# Patient Record
Sex: Female | Born: 1970 | Race: Black or African American | Hispanic: No | Marital: Married | Smoking: Never smoker
Health system: Southern US, Community
[De-identification: ages and names within clinical notes are randomized; demographics above are authoritative.]

## PROBLEM LIST (undated history)

## (undated) DIAGNOSIS — C50919 Malignant neoplasm of unspecified site of unspecified female breast: Secondary | ICD-10-CM

## (undated) DIAGNOSIS — E669 Obesity, unspecified: Secondary | ICD-10-CM

## (undated) DIAGNOSIS — D216 Benign neoplasm of connective and other soft tissue of trunk, unspecified: Secondary | ICD-10-CM

## (undated) DIAGNOSIS — Z1239 Encounter for other screening for malignant neoplasm of breast: Secondary | ICD-10-CM

## (undated) DIAGNOSIS — E119 Type 2 diabetes mellitus without complications: Secondary | ICD-10-CM

## (undated) DIAGNOSIS — D3A00 Benign carcinoid tumor of unspecified site: Secondary | ICD-10-CM

## (undated) DIAGNOSIS — F419 Anxiety disorder, unspecified: Secondary | ICD-10-CM

## (undated) HISTORY — DX: Malignant neoplasm of unspecified site of unspecified female breast: C50.919

## (undated) HISTORY — PX: TONSILLECTOMY: SUR1361

## (undated) HISTORY — DX: Type 2 diabetes mellitus without complications: E11.9

## (undated) HISTORY — DX: Obesity, unspecified: E66.9

## (undated) HISTORY — DX: Encounter for other screening for malignant neoplasm of breast: Z12.39

## (undated) HISTORY — DX: Benign neoplasm of connective and other soft tissue of trunk, unspecified: D21.6

## (undated) HISTORY — PX: CHOLECYSTECTOMY: SHX55

## (undated) HISTORY — DX: Benign carcinoid tumor of unspecified site: D3A.00

---

## 2006-05-06 HISTORY — PX: CYST EXCISION: SHX5701

## 2007-07-16 ENCOUNTER — Ambulatory Visit: Payer: Self-pay | Admitting: Obstetrics and Gynecology

## 2008-08-23 ENCOUNTER — Ambulatory Visit: Payer: Self-pay | Admitting: Internal Medicine

## 2010-05-06 DIAGNOSIS — E669 Obesity, unspecified: Secondary | ICD-10-CM

## 2010-05-06 DIAGNOSIS — Z1239 Encounter for other screening for malignant neoplasm of breast: Secondary | ICD-10-CM

## 2010-05-06 DIAGNOSIS — D216 Benign neoplasm of connective and other soft tissue of trunk, unspecified: Secondary | ICD-10-CM

## 2010-05-06 HISTORY — DX: Benign neoplasm of connective and other soft tissue of trunk, unspecified: D21.6

## 2010-05-06 HISTORY — DX: Encounter for other screening for malignant neoplasm of breast: Z12.39

## 2010-05-06 HISTORY — DX: Obesity, unspecified: E66.9

## 2010-08-31 ENCOUNTER — Ambulatory Visit: Payer: Self-pay | Admitting: Internal Medicine

## 2010-09-04 HISTORY — PX: LIPOMA EXCISION: SHX5283

## 2010-09-17 ENCOUNTER — Ambulatory Visit: Payer: Self-pay | Admitting: Internal Medicine

## 2011-10-18 ENCOUNTER — Ambulatory Visit: Payer: Self-pay

## 2012-08-10 ENCOUNTER — Encounter: Payer: Self-pay | Admitting: *Deleted

## 2012-08-18 ENCOUNTER — Ambulatory Visit (INDEPENDENT_AMBULATORY_CARE_PROVIDER_SITE_OTHER): Payer: BC Managed Care – PPO | Admitting: General Surgery

## 2012-08-18 ENCOUNTER — Encounter: Payer: Self-pay | Admitting: General Surgery

## 2012-08-18 VITALS — BP 146/86 | HR 76 | Resp 14 | Ht 69.0 in | Wt 266.0 lb

## 2012-08-18 DIAGNOSIS — D1779 Benign lipomatous neoplasm of other sites: Secondary | ICD-10-CM

## 2012-08-18 DIAGNOSIS — D171 Benign lipomatous neoplasm of skin and subcutaneous tissue of trunk: Secondary | ICD-10-CM

## 2012-08-18 NOTE — Patient Instructions (Addendum)
Continue to monitor areas, Call for questions or concerns or size changes.

## 2012-08-18 NOTE — Progress Notes (Signed)
Patient ID: Allison Harper, female   DOB: 1971/02/06, 42 y.o.   MRN: [id number]  Chief Complaint  Patient presents with  . Other    lipoma    HPI Allison Harper is a 42 y.o. female.  Patient here for evaluation of the lipomas on her back, wants to discuss "removing them". Worse one is on her left back.  Tender but no pain. Has known history of excision of lipomas on her back in 2012. Patient has noticed some left hand "tingling".  The patient has been having difficulty with tingling in the left hand and was under the impression that the left back lipoma overlying the scapula was the source for this discomfort. HPI  Past Medical History  Diagnosis Date  . 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 Obesity 2012  . Breast screening, unspecified 2012    Past Surgical History  Procedure Laterality Date  . Cholecystectomy    . Tonsillectomy    . Cyst excision  2008    from back  . Lipoma excision Left 09/2010    flank    No family history on file.  Social History History  Substance Use Topics  . Smoking status: Never Smoker   . Smokeless tobacco: Not on file  . Alcohol Use: Yes     Comment: ocassionally, 1-2 per month    No Known Allergies  Current Outpatient Prescriptions  Medication Sig Dispense Refill  . drospirenone-ethinyl estradiol (YAZ) 3-0.02 MG tablet Take 1 tablet by mouth daily.       No current facility-administered medications for this visit.    Review of Systems Review of Systems  Constitutional: Negative.   Cardiovascular: Negative.     Blood pressure 146/86, pulse 76, resp. rate 14, height 5\' 9"  (1.753 m), weight 266 lb (120.657 kg), last menstrual period 08/03/2012.  Physical Exam Physical Exam  Constitutional: She is oriented to person, place, and time. She appears well-developed and well-nourished.  Neurological: She is alert and oriented to person, place, and time.  Skin: Skin is warm and dry.   4 x 5  left scapula area lipoma. No evidence of inflammation or local tenderness. Right back at T 11 posterior axillary line noted 2 lipomas 1 x 1 in size   Data Reviewed The left lateral abdominal wall lipoma excised in May 2012 was a simple lipoma. She had previously undergone excision of an angiolipoma from the left back in 2008.  Assessment    Lipoma, clinically asymptomatic.    Plan    Excision is warranted if the areas become symptomatic, otherwise observation alone is warned him.       Earline Mayotte 08/19/2012, 3:06 PM

## 2012-08-19 ENCOUNTER — Encounter: Payer: Self-pay | Admitting: General Surgery

## 2014-02-28 ENCOUNTER — Encounter: Payer: Self-pay | Admitting: General Surgery

## 2014-02-28 ENCOUNTER — Ambulatory Visit (INDEPENDENT_AMBULATORY_CARE_PROVIDER_SITE_OTHER): Payer: BC Managed Care – PPO | Admitting: General Surgery

## 2014-02-28 VITALS — BP 162/84 | HR 76 | Resp 12 | Ht 69.0 in | Wt 273.0 lb

## 2014-02-28 DIAGNOSIS — R2232 Localized swelling, mass and lump, left upper limb: Secondary | ICD-10-CM | POA: Insufficient documentation

## 2014-02-28 NOTE — Progress Notes (Signed)
Patient ID: Allison Harper, female   DOB: June 27, 1970, 43 y.o.   MRN: [id number]  Chief Complaint  Patient presents with  . Other    lipoma on left shoulder    HPI Allison Harper is a 43 y.o. female here today for for a excision left shoulder lipoma. The patient was seen in April 2014. Since that time she reports the area has increased in size and discomfort. 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  HPI  Past Medical History  Diagnosis Date  . 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 Obesity 2012  . Breast screening, unspecified 2012    Past Surgical History  Procedure Laterality Date  . Cholecystectomy    . Tonsillectomy    . Cyst excision  2008    from back  . Lipoma excision Left 09/2010    flank    No family history on file.  Social History History  Substance Use Topics  . Smoking status: Never Smoker   . Smokeless tobacco: Not on file  . Alcohol Use: Yes     Comment: ocassionally, 1-2 per month    No Known Allergies  Current Outpatient Prescriptions  Medication Sig Dispense Refill  . drospirenone-ethinyl estradiol (YAZ) 3-0.02 MG tablet Take 1 tablet by mouth daily.       No current facility-administered medications for this visit.    Review of Systems Review of Systems  Blood pressure 162/84, pulse 76, resp. rate 12, height 5\' 9"  (1.753 m), weight 273 lb (123.832 kg).  Physical Exam Physical Exam Examination of the left posterior shoulder shows a 68 cm diameter area just below the tip of the scapula consistent with a lipoma at or above the level of the deep fascia. No erythema or induration.   Assessment    Symptomatic left lower shoulder lipoma.    Plan    The patient desired excision for relief of her discomfort. The procedure was reviewed. (C she has had previous lipomas excised his office  procedures). Area was prepped with ChloraPrep and draped. A total of 30 mL of 0.5% Xylocaine with 0.25% Marcaine with 1-200,000 units of epinephrine was utilized well tolerated. A transverse incision over the mass was made and carried down through the adipose layer. There was marked adherence of the lipoma-like tissue to the adjacent adipose tissue. It did extend down to but did not transverse the deep fascia. This was excised with sharp dissection. Scant bleeding was noted. The wound was closed in layers with 3-0 Vicryl running suture to the deep adipose layer and a running 3-0 Vicryl subcutaneous suture for the skin. Benzoin and Steri-Strips followed by Telfa and Tegaderm dressing was applied. The patient tolerated procedure well. A prescription for Norco 5/325, #30 with the inscription 1-2 by mouth every 4 hours when necessary for pain was provided. No refills. As she is presently living in [place], she was asked to return for a brief wound checked the next time she is in [place].    PCP: Lonna Duval 02/28/2014, 8:32 PM

## 2014-03-01 NOTE — Addendum Note (Signed)
Addended by: Chrystie Nose on: 03/01/2014 09:35 AM   Modules accepted: Level of Service

## 2014-03-03 ENCOUNTER — Telehealth: Payer: Self-pay | Admitting: *Deleted

## 2014-03-03 NOTE — Telephone Encounter (Signed)
Prescription for tramadol 50 mg, #30 with the inscription one-2 by mouth every 4 hours when necessary for pain with no refills was [facility].

## 2014-03-03 NOTE — Telephone Encounter (Signed)
Pts friend called Nichole and said that the medicince you prescribed the pt which was Norco is making her itch and turn red in the face area, she was wondering can you call in something different for her for pain at [facility] in [place].

## 2014-03-04 ENCOUNTER — Telehealth: Payer: Self-pay | Admitting: *Deleted

## 2014-03-04 LAB — PATHOLOGY

## 2014-03-04 NOTE — Telephone Encounter (Signed)
Called pt on 03-03-14 to let her know that her [facility] into [facility] in 

## 2015-04-27 ENCOUNTER — Other Ambulatory Visit: Payer: Self-pay | Admitting: Obstetrics and Gynecology

## 2015-04-27 DIAGNOSIS — Z1231 Encounter for screening mammogram for malignant neoplasm of breast: Secondary | ICD-10-CM

## 2015-05-03 DIAGNOSIS — E119 Type 2 diabetes mellitus without complications: Secondary | ICD-10-CM | POA: Insufficient documentation

## 2015-05-04 ENCOUNTER — Ambulatory Visit
Admission: RE | Admit: 2015-05-04 | Discharge: 2015-05-04 | Disposition: A | Discharge status: Home / Self Care | Payer: BLUE CROSS/BLUE SHIELD | Source: Ambulatory Visit | Attending: Obstetrics and Gynecology | Admitting: Obstetrics and Gynecology

## 2015-05-04 DIAGNOSIS — Z1231 Encounter for screening mammogram for malignant neoplasm of breast: Secondary | ICD-10-CM | POA: Diagnosis not present

## 2019-09-08 ENCOUNTER — Other Ambulatory Visit: Payer: Self-pay | Admitting: Certified Nurse Midwife

## 2019-09-08 DIAGNOSIS — Z1231 Encounter for screening mammogram for malignant neoplasm of breast: Secondary | ICD-10-CM

## 2019-09-20 ENCOUNTER — Ambulatory Visit
Admission: RE | Admit: 2019-09-20 | Discharge: 2019-09-20 | Disposition: A | Discharge status: Home / Self Care | Payer: BC Managed Care – PPO | Source: Ambulatory Visit | Attending: Certified Nurse Midwife | Admitting: Certified Nurse Midwife

## 2019-09-20 DIAGNOSIS — Z1231 Encounter for screening mammogram for malignant neoplasm of breast: Secondary | ICD-10-CM

## 2019-09-22 ENCOUNTER — Other Ambulatory Visit: Payer: Self-pay | Admitting: Certified Nurse Midwife

## 2019-09-22 DIAGNOSIS — R921 Mammographic calcification found on diagnostic imaging of breast: Secondary | ICD-10-CM

## 2019-09-22 DIAGNOSIS — R928 Other abnormal and inconclusive findings on diagnostic imaging of breast: Secondary | ICD-10-CM

## 2019-10-01 ENCOUNTER — Ambulatory Visit
Admission: RE | Admit: 2019-10-01 | Discharge: 2019-10-01 | Disposition: A | Discharge status: Home / Self Care | Payer: BC Managed Care – PPO | Source: Ambulatory Visit | Attending: Certified Nurse Midwife | Admitting: Certified Nurse Midwife

## 2019-10-01 DIAGNOSIS — R921 Mammographic calcification found on diagnostic imaging of breast: Secondary | ICD-10-CM

## 2019-10-01 DIAGNOSIS — R928 Other abnormal and inconclusive findings on diagnostic imaging of breast: Secondary | ICD-10-CM

## 2019-10-07 ENCOUNTER — Other Ambulatory Visit: Payer: Self-pay | Admitting: Certified Nurse Midwife

## 2019-10-07 DIAGNOSIS — R921 Mammographic calcification found on diagnostic imaging of breast: Secondary | ICD-10-CM

## 2019-10-07 DIAGNOSIS — R928 Other abnormal and inconclusive findings on diagnostic imaging of breast: Secondary | ICD-10-CM

## 2019-12-04 ENCOUNTER — Ambulatory Visit: Payer: BLUE CROSS/BLUE SHIELD

## 2019-12-04 ENCOUNTER — Ambulatory Visit: Payer: Self-pay | Attending: Internal Medicine

## 2019-12-04 DIAGNOSIS — Z23 Encounter for immunization: Secondary | ICD-10-CM

## 2019-12-04 NOTE — Progress Notes (Signed)
   Covid-19 Vaccination Clinic  Name:  Allison Harper    MRN: [id number] DOB: Aug 12, 1970  12/04/2019  [name] was observed post Covid-19 immunization for 15 minutes without incident. She was provided with Vaccine Information Sheet and instruction to access the V-Safe system.   [name] was instructed to call 911 with any severe reactions post vaccine: Marland Kitchen Difficulty breathing  . Swelling of face and throat  . A fast heartbeat  . A bad rash all over body  . Dizziness and weakness   Immunizations Administered    Name Date Dose VIS Date Route   Pfizer COVID-19 Vaccine 12/04/2019  8:07 AM 0.3 mL 06/30/2018 Intramuscular   Manufacturer: [place]   Lot: [id number]   [place]: [address]-6

## 2019-12-25 ENCOUNTER — Ambulatory Visit: Payer: BC Managed Care – PPO

## 2020-05-06 HISTORY — PX: MASTECTOMY: SHX3

## 2021-05-06 DIAGNOSIS — D649 Anemia, unspecified: Secondary | ICD-10-CM

## 2021-05-06 HISTORY — DX: Anemia, unspecified: D64.9

## 2021-05-08 DIAGNOSIS — C50919 Malignant neoplasm of unspecified site of unspecified female breast: Secondary | ICD-10-CM | POA: Insufficient documentation

## 2021-05-11 ENCOUNTER — Inpatient Hospital Stay: Payer: 59 | Attending: Oncology | Admitting: Oncology

## 2021-05-11 ENCOUNTER — Telehealth: Payer: Self-pay | Admitting: *Deleted

## 2021-05-11 ENCOUNTER — Encounter: Payer: Self-pay | Admitting: Oncology

## 2021-05-11 ENCOUNTER — Encounter: Payer: Self-pay | Admitting: *Deleted

## 2021-05-11 ENCOUNTER — Telehealth: Payer: Self-pay | Admitting: Oncology

## 2021-05-11 ENCOUNTER — Other Ambulatory Visit: Payer: Self-pay

## 2021-05-11 VITALS — BP 135/68 | HR 68 | Temp 98.1°F | Resp 16 | Wt 261.0 lb

## 2021-05-11 DIAGNOSIS — C50911 Malignant neoplasm of unspecified site of right female breast: Secondary | ICD-10-CM

## 2021-05-11 DIAGNOSIS — Z7189 Other specified counseling: Secondary | ICD-10-CM

## 2021-05-11 DIAGNOSIS — C779 Secondary and unspecified malignant neoplasm of lymph node, unspecified: Secondary | ICD-10-CM

## 2021-05-11 DIAGNOSIS — Z171 Estrogen receptor negative status [ER-]: Secondary | ICD-10-CM

## 2021-05-11 DIAGNOSIS — C50111 Malignant neoplasm of central portion of right female breast: Secondary | ICD-10-CM | POA: Insufficient documentation

## 2021-05-11 NOTE — Progress Notes (Signed)
Patient here for initial visit she is expressing anxiety about knowing her treatment plan.  She reports that she has gout in her left foot that is a level 8 pain today. She is not being treated for the gout.

## 2021-05-11 NOTE — Telephone Encounter (Signed)
Pt called to cancel all appt after 1-9. She will give you a call when she is ready to start  things.

## 2021-05-11 NOTE — Progress Notes (Signed)
Met patient and her wife at her initial medical  oncology consult with Dr. Janese Banks.  Patient was diagnosed and had surgery with a mastectomy of the right breast at [facility].  She transferred care here for her chemotherapy treatment.  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  Gave patient breast cancer educational literature, "My Breast Cancer Treatment Handbook" by Josephine Igo, RN.   Patient to call with any questions or needs.

## 2021-05-11 NOTE — Telephone Encounter (Signed)
Pt has a gout flare out and wanted something for it. Dr. Janese Banks sent me a message that she does not want her to have steroids and rec: naproxen OTC 500 mg bid for 5 days. I did leave her a message that I did change the pharmacy to be [facility]. Left my name and number to call if she needs to call back for any reason

## 2021-05-14 ENCOUNTER — Inpatient Hospital Stay: Payer: 59

## 2021-05-14 DIAGNOSIS — Z1731 Human epidermal growth factor receptor 2 positive status: Secondary | ICD-10-CM | POA: Insufficient documentation

## 2021-05-14 DIAGNOSIS — C50911 Malignant neoplasm of unspecified site of right female breast: Secondary | ICD-10-CM | POA: Insufficient documentation

## 2021-05-15 ENCOUNTER — Ambulatory Visit: Payer: 59

## 2021-05-16 ENCOUNTER — Ambulatory Visit: Payer: 59 | Admitting: Radiation Oncology

## 2021-05-18 ENCOUNTER — Ambulatory Visit (HOSPITAL_BASED_OUTPATIENT_CLINIC_OR_DEPARTMENT_OTHER): Payer: 59

## 2021-05-18 DIAGNOSIS — Z6839 Body mass index (BMI) 39.0-39.9, adult: Secondary | ICD-10-CM | POA: Insufficient documentation

## 2021-05-18 DIAGNOSIS — M109 Gout, unspecified: Secondary | ICD-10-CM | POA: Insufficient documentation

## 2021-05-18 DIAGNOSIS — Z79899 Other long term (current) drug therapy: Secondary | ICD-10-CM | POA: Insufficient documentation

## 2021-05-20 DIAGNOSIS — C50111 Malignant neoplasm of central portion of right female breast: Secondary | ICD-10-CM | POA: Insufficient documentation

## 2021-05-20 DIAGNOSIS — Z7189 Other specified counseling: Secondary | ICD-10-CM | POA: Insufficient documentation

## 2021-05-20 NOTE — Progress Notes (Signed)
Hematology/Oncology Consult note [facility] [phone] [phone]  Patient Care Team: Pcp, No as PCP - General Bary Castilla Forest Gleason, MD as Consulting Physician (General Surgery)   Name of the patient: Allison Harper  [id number]  19-Jul-1970    Reason for referral-new diagnosis of breast cancer   Referring physician- Dr. Jackson Latino  Date of visit: 05/20/21   History of presenting illness- Patient is a 51 year old female with a past medical history significant for obesity who self palpated a right breast mass that led to a diagnostic mammogram.  Bilateral diagnostic mammogram on 02/28/2021 showed heterogeneous dense breast tissue and malignant appearing calcifications involving the lateral right breast spanning 15 cm.  Suggestion of multiple asymmetries about the area of malignant calcifications.  Also suggestion of asymmetries in the upper outer and upper inner quadrant of the left breast.  Multiple clusters of calcifications in the lower outer quadrant of the left breast also considered suspicious.  Bilateral breast and axillary ultrasound also showed abnormal heterogeneous tissue in the upper central and outer right breast.  Ultrasound of the right axilla showed multiple mildly thickened lymph nodes  Patient had a stereotactic core biopsy of the left breast lower outer quadrant which showed fibrocystic changes negative for malignancy.  Right breast upper outer quadrant stereotactic biopsy showed DCIS with comedonecrosis negative for invasive malignancy ER negative less than 1%.  Right axillary lymph node aspiration showed benign lymphoid and fibroadipose tissue.  Left breast upper inner quadrant biopsy also showed fibrocystic changes negative for malignancy.  Patient had a bilateral breast MRI at [facility].  I do not have those images to review.  This was done on 04/02/2021 which again confirmed extensive abnormal enhancement 16 x 4.7 x 14 cm  occupying the entire lateral and central portions of the right breast.  Asymmetric prominent right axillary lymph nodes and a biopsy of a different axillary lymph node at that time was suggested second look ultrasound for the left breast was recommended.  Second look ultrasound of the left breast showed no suspicious focal abnormality in the upper outer quadrant of the left breast and otherwise appearance of normal fibroglandular tissue and a left breast MRI was recommended in 6 months.  Patient underwent right total mastectomy with right axillary sentinel lymph node biopsy without immediate reconstruction by Dr. Oletha Blend at [facility] on 04/20/2021.  Final pathology showed extensive DCIS with multiple foci of microinvasion measuring 14.8 x 13 x 6 cm.  There was a focus of poorly differentiated invasive carcinoma located centrally measuring at least 12 mm microscopically.  Surgical margins were negative.  5 sentinel lymph nodes was negative for metastatic disease.  There was an abnormal right axillary nonsentinel lymph node which was taken out at the time of surgery measuring 3.1 x 2.2 x 1.5 cm which contained greater than 3 mm metastatic focus without extranodal extension.  This invasive component was negative for ER, negative for PR and positive for HER2 by IHC (+3).  Patient lives in [place] and therefore has been referred to [place] for consideration of adjuvant chemotherapy options.  Patient is here with her significant other.  She does feel overwhelmed with her current diagnosis.  She was expecting that all of this would turn out to be DCIS but the fact that this turned out to be invasive cancer with a likelihood of needing chemotherapy has been overwhelming for patient to deal with.  Patient currently reports pain and swelling in her left foot which began  about 2 to 3 days ago.  She has had prior episodes of gout.  ECOG PS- 0  Pain scale- 0   Review of systems- Review of Systems  Constitutional:   Negative for chills, fever, malaise/fatigue and weight loss.  HENT:  Negative for congestion, ear discharge and nosebleeds.   Eyes:  Negative for blurred vision.  Respiratory:  Negative for cough, hemoptysis, sputum production, shortness of breath and wheezing.   Cardiovascular:  Negative for chest pain, palpitations, orthopnea and claudication.  Gastrointestinal:  Negative for abdominal pain, blood in stool, constipation, diarrhea, heartburn, melena, nausea and vomiting.  Genitourinary:  Negative for dysuria, flank pain, frequency, hematuria and urgency.  Musculoskeletal:  Negative for back pain, joint pain and myalgias.  Skin:  Negative for rash.  Neurological:  Negative for dizziness, tingling, focal weakness, seizures, weakness and headaches.  Endo/Heme/Allergies:  Does not bruise/bleed easily.  Psychiatric/Behavioral:  Negative for depression and suicidal ideas. The patient does not have insomnia.    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, unspecified 05/06/2010     Past Surgical History:  Procedure Laterality Date   CHOLECYSTECTOMY     CYST EXCISION  2008   from back   LIPOMA EXCISION Left 09/2010   flank   TONSILLECTOMY      Social History   Socioeconomic History   Marital status: Single    Spouse name: Not on file   Number of children: Not on file   Years of education: Not on file   Highest education level: Not on file  Occupational History   Not on file  Tobacco Use   Smoking status: Never   Smokeless tobacco: Not on file  Substance and Sexual Activity   Alcohol use: Yes    Comment: ocassionally, 1-2 per month   Drug  use: No   Sexual activity: Not on file  Other Topics Concern   Not on file  Social History Narrative   Not on file   Social Determinants of Health   Financial Resource Strain: Not on file  Food Insecurity: Not on file  Transportation Needs: Not on file  Physical Activity: Not on file  Stress: Not on file  Social Connections: Not on file  Intimate Partner Violence: Not on file     Family History  Problem Relation Age of Onset   Breast cancer Neg Hx     No current outpatient medications on file.   Physical exam:  Vitals:   05/11/21 1156  BP: 135/68  Pulse: 68  Resp: 16  Temp: 98.1 F (36.7 C)  TempSrc: Tympanic  SpO2: 100%  Weight: 261 lb (118.4 kg)   Physical Exam Constitutional:      General: She is not in acute distress. Cardiovascular:     Rate and Rhythm: Normal rate and regular rhythm.     Heart sounds: Normal heart sounds.  Pulmonary:     Effort: Pulmonary effort is normal.     Breath sounds: Normal breath sounds.  Abdominal:     General: Bowel sounds are normal.     Palpations: Abdomen is soft.  Musculoskeletal:  Comments: Base of left toe appears red and swollen consistent with acute gout  Skin:    General: Skin is warm and dry.  Neurological:     Mental Status: She is alert and oriented to person, place, and time.  Breast exam: No palpable masses in the left breast.  No palpable right axillary adenopathy.  Scar of recent right mastectomy is healing well   Assessment and plan- Patient is a 51 y.o. female with newly diagnosed pathological prognostic stage II A pT2 pN1a MX invasive mammary carcinoma of the right breast here to discuss further management  I discussed mammogram ultrasound as well as MRI findings in detail with the patient.  In the setting of extensive right breast DCIS spanning 16 cm patient had a focus of invasive mammary carcinoma 1.2 cm in size which is ER/PR negative and HER2 positive.  5 sentinel lymph nodes were negative for  malignancy but one nonsentinel lymph node did have a 3 mm focus of metastatic disease without extranodal extension.  This constitutes pathological prognostic stage IIa disease.  Given that her tumor is ER/PR negative and HER2 positive this would be considered aggressive biology warranting adjuvant chemotherapy.  With node positive disease I would recommend adjuvant Taxotere, carboplatin Herceptin and Perjeta (TCHP) regimen given IV every 3 weeks for 6 cycles.  Following that patient will need adjuvant radiation treatment and will continue with Herceptin and Perjeta alone to complete 1 year of treatment.  Discussed risks and benefits of both Taxotere and carboplatin including all but not limited to nausea, vomiting, low blood counts, risk of infections and hospitalization.  Risk of peripheral neuropathy associated with both Taxotere and carboplatin.  Risk of cardiotoxicity skin rash diarrhea associated with Herceptin and Perjeta.  Treatment will be given with a curative intent.    Data for offering TCHP chemotherapy adjuvantly is mainly extrapolated from neoadjuvant node positive disease setting as well as the Affinity trial which showed benefit in adding perjeta to Herceptin in the adjuvant setting for node positive disease.In the phase III TRAIN-2 trial, among 438 patients with stage II to III HER2-positive breast cancer randomly assigned to anthracycline-containing chemotherapy (three cycles of 5-fluoruoracil, epirubicin, and cyclophosphamide followed by six cycles of weekly paclitaxel and carboplatin, administered days 1 and 8 every three weeks) versus an anthracycline-free chemotherapy regimen (nine cycles of weekly paclitaxel and carboplatin, using the same schedule), with trastuzumab and pertuzumab administered every three weeks with all chemotherapy cycles, the pCR rates did not differ between the arms (67 versus 68 percent) . Updated results from this study demonstrate equivalent three-year EFS (94  versus 93 percent) and OS (98 versus 98 percent) for the anthracycline-free versus the anthracycline-containing regimens, respectively    Patient will need a baseline echocardiogram as well as chemo teach.  Port will be placed by Dr. Jackson Latino.  Given that she has node positive disease I would also like to get systemic scans including CT chest abdomen and pelvis with contrast and bone scan to complete her staging work-up.  Patient had less than 3 lymph nodes positive and therefore I do not recommend additional axillary lymph node dissection at this time.  She would be requiring adjuvant radiation treatment anyways and additional axillary lymph node dissection will increase her risk of lymphedema significantly.  Had any known that patient had lymph node positive HER2 positive disease prior to undergoing mastectomy she would still receive the same [facility] chemotherapy neoadjuvantly and if there was residual disease left at the time of surgery we  would have switched her to [place].  However given that we did not have this information before surgery I would not recommend any [place] at this time.  There would be no role for endocrine therapy given that she has ER negative disease.  With regards to her acute gout-I did review the patient's kidney functions which were normal and my nurse gave her a call that a trial of NSAIDs for 3 to 4 days would be reasonable before considering steroid therapy for acute gout.  She would also benefit from genetic testing and a referral was made at today's visit  Plan was for me to see her tentatively in 2 weeks time to start adjuvant chemotherapy.  However patient is not happy with her care in [place] and has decided not to come for her future appointments   Cancer Staging  Malignant neoplasm of central portion of right breast in female, estrogen receptor negative ([place]) Staging form: Breast, AJCC 8th Edition - Pathologic stage from 05/11/2021: Stage IIA (pT1c,  pN1a(sn), cM0, G3, ER-, PR-, HER2+) - Signed by Sindy Guadeloupe, MD on 05/20/2021 Stage prefix: Initial diagnosis Method of lymph node assessment: Sentinel lymph node biopsy Multigene prognostic tests performed: None Histologic grading system: 3 grade system Residual tumor (R): R0 - None     Thank you for this kind referral and the opportunity to participate in the care of this  Patient   Visit Diagnosis 1. Right breast cancer with malignant cells in regional lymph nodes no greater than 0.2 mm and no more than 200 cells (HCC)   2. Malignant neoplasm of right breast in female, estrogen receptor negative, unspecified site of breast [facility])     Dr. Randa Evens, MD, MPH [facility] at [facility] [phone] 05/20/2021

## 2021-05-21 ENCOUNTER — Ambulatory Visit: Payer: 59

## 2021-05-21 ENCOUNTER — Other Ambulatory Visit: Payer: 59

## 2021-05-22 ENCOUNTER — Ambulatory Visit: Payer: 59 | Admitting: Oncology

## 2021-05-22 ENCOUNTER — Other Ambulatory Visit: Payer: 59

## 2021-05-22 ENCOUNTER — Telehealth: Payer: Self-pay | Admitting: *Deleted

## 2021-05-22 ENCOUNTER — Ambulatory Visit: Payer: 59

## 2021-05-22 ENCOUNTER — Telehealth: Payer: Self-pay | Admitting: Oncology

## 2021-05-22 NOTE — Telephone Encounter (Signed)
The only thing I can think of is that the authorization of the scans may be active even though the scans has been cancelled. And when the pt is somewhere else getting the scan that it shows approval with auth and they will not allow get same scan at another facility.  So maybe cancel the authorization

## 2021-05-22 NOTE — Telephone Encounter (Signed)
Call from [facility] requesting that we cancel patient scans as she is transferring care to them and will being having xray at [facility]. Looks like this has already been cancelled and noted

## 2021-05-22 NOTE — Telephone Encounter (Signed)
Mattie from [facility] called about cancelling a CT scan order for this pt. Pt will be seen at [facility]  for treatment. Call back at [phone]  to let [facility] know that order has been cancelled.

## 2021-05-23 ENCOUNTER — Encounter: Payer: Self-pay | Admitting: *Deleted

## 2021-05-23 ENCOUNTER — Ambulatory Visit: Payer: 59 | Admitting: Occupational Therapy

## 2021-05-24 ENCOUNTER — Ambulatory Visit: Payer: 59

## 2021-05-24 ENCOUNTER — Encounter: Payer: Self-pay | Admitting: *Deleted

## 2021-05-24 DIAGNOSIS — R918 Other nonspecific abnormal finding of lung field: Secondary | ICD-10-CM | POA: Insufficient documentation

## 2021-05-24 DIAGNOSIS — R590 Localized enlarged lymph nodes: Secondary | ICD-10-CM | POA: Insufficient documentation

## 2021-05-25 ENCOUNTER — Other Ambulatory Visit: Payer: 59

## 2021-05-25 ENCOUNTER — Ambulatory Visit: Payer: 59 | Admitting: Oncology

## 2021-05-25 ENCOUNTER — Encounter: Payer: 59 | Admitting: Licensed Clinical Social Worker

## 2021-05-25 ENCOUNTER — Ambulatory Visit: Payer: 59

## 2021-05-25 ENCOUNTER — Encounter: Payer: Self-pay | Admitting: Oncology

## 2021-07-04 DIAGNOSIS — D0511 Intraductal carcinoma in situ of right breast: Secondary | ICD-10-CM | POA: Insufficient documentation

## 2021-07-26 DIAGNOSIS — C7A8 Other malignant neuroendocrine tumors: Secondary | ICD-10-CM | POA: Insufficient documentation

## 2021-11-15 ENCOUNTER — Ambulatory Visit
Admission: RE | Admit: 2021-11-15 | Discharge: 2021-11-15 | Disposition: A | Discharge status: Home / Self Care | Payer: 59 | Source: Ambulatory Visit | Attending: Radiation Oncology | Admitting: Radiation Oncology

## 2021-11-15 ENCOUNTER — Encounter: Payer: Self-pay | Admitting: Radiation Oncology

## 2021-11-15 VITALS — BP 122/77 | HR 93 | Temp 97.8°F | Wt 231.8 lb

## 2021-11-15 DIAGNOSIS — Z7984 Long term (current) use of oral hypoglycemic drugs: Secondary | ICD-10-CM | POA: Insufficient documentation

## 2021-11-15 DIAGNOSIS — C773 Secondary and unspecified malignant neoplasm of axilla and upper limb lymph nodes: Secondary | ICD-10-CM | POA: Diagnosis present

## 2021-11-15 DIAGNOSIS — Z7952 Long term (current) use of systemic steroids: Secondary | ICD-10-CM | POA: Diagnosis not present

## 2021-11-15 DIAGNOSIS — Z171 Estrogen receptor negative status [ER-]: Secondary | ICD-10-CM | POA: Diagnosis not present

## 2021-11-15 DIAGNOSIS — C50111 Malignant neoplasm of central portion of right female breast: Secondary | ICD-10-CM | POA: Insufficient documentation

## 2021-11-15 NOTE — Consult Note (Signed)
NEW PATIENT EVALUATION  Name: Allison Harper  MRN: [id number]  Date:   11/15/2021     DOB: Jun 29, 1970   This 51 y.o. 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  REFERRING PHYSICIAN: No ref. provider found  CHIEF COMPLAINT:  Chief Complaint  Patient presents with   Breast Cancer    Consult    DIAGNOSIS: The encounter diagnosis was Malignant neoplasm of central portion of right breast in female, estrogen receptor negative ([place]).   PREVIOUS INVESTIGATIONS:  Ultrasound mammograms reviewed Pathology reports reviewed Clinical notes reviewed  HPI: Patient is a pleasant 51 year old female with a rather complicated history of breast cancer.  She initially presented with self discovered mass in the right breast.  Mammogram showed heterogeneous dense breasts and malignant appearing calcifications involving the lateral right breast spanning 15 cm.  Axillary ultrasound of the right axilla showed multiple mildly thickened lymph nodes.  Stereotactic core biopsy of the right breast showed DCIS with comedonecrosis negative for invasive carcinoma.  Right axillary lymph node aspiration also was benign.  She underwent MRI scan and during diagnostic which again confirmed extensive abnormal enhancement measuring 16 cm in greatest dimension of the entire lateral and central portions of the right breast.  There is also asymmetric prominent right axillary lymph nodes.  She underwent a right modified radical mastectomy in December 2022 with pathology showing DCIS with multiple foci of microinvasion measuring 14.8 x 13 x 6 cm.  There was a focus of poorly differentiated invasive carcinoma in the central portion measuring at least 1.2 cm.  Margins were negative.  6 axillary lymph nodes were evaluated one of them had a 3 mm focus of  metastatic disease and a lymph node measuring 3.1 x 2.2 x 1.5 cm.  Tumor was ER/PR negative and HER2/neu overexpressed.  Patient also had multiple pulmonary nodules which were biopsied were low grade neuroendocrine tumors that is being followed.  She has been started on [facility] chemotherapy and has 1 more cycle left.  She is tolerating her chemotherapy well.  She is seen today for radiation oncology opinion.  She is doing well specifically denies any swelling in her right upper extremity any bone pain cough or any new nodularities in the mastectomy scar.  PLANNED TREATMENT REGIMEN: Adjuvant radiation therapy  PAST MEDICAL HISTORY:  has a past medical history of Breast cancer ([place]), Breast screening, unspecified (05/06/2010), Obesity (05/06/2010), and Other benign neoplasm of connective and other soft tissue of trunk, unspecified (05/06/2010).    PAST SURGICAL HISTORY:  Past Surgical History:  Procedure Laterality Date   CHOLECYSTECTOMY     CYST EXCISION  2008   from back   LIPOMA EXCISION Left 09/2010   flank   TONSILLECTOMY      FAMILY HISTORY: family history is not on file.  SOCIAL HISTORY:  reports that she has never smoked. She does not have any smokeless tobacco history on file. She reports current alcohol use. She reports that she does not use drugs.  ALLERGIES: Norco [hydrocodone-acetaminophen]  MEDICATIONS:  Current Outpatient Medications  Medication Sig Dispense Refill   metFORMIN (GLUCOPHAGE) 500 MG tablet Take by mouth.     ondansetron (ZOFRAN) 8 MG tablet Take by mouth.     prochlorperazine (COMPAZINE) 10 MG tablet Take by mouth.     dexamethasone (DECADRON) 4 MG tablet Take 16 mg by  mouth daily.     levofloxacin (LEVAQUIN) 500 MG tablet Take 500 mg by mouth daily.     lidocaine-prilocaine (EMLA) cream SMARTSIG:1 Topical Daily     LORazepam (ATIVAN) 0.5 MG tablet Take 0.5 mg by mouth daily as needed.     No current facility-administered medications for this encounter.     ECOG PERFORMANCE STATUS:  0 - Asymptomatic  REVIEW OF SYSTEMS: Patient denies any weight loss, fatigue, weakness, fever, chills or night sweats. Patient denies any loss of vision, blurred vision. Patient denies any ringing  of the ears or hearing loss. No irregular heartbeat. Patient denies heart murmur or history of fainting. Patient denies any chest pain or pain radiating to her upper extremities. Patient denies any shortness of breath, difficulty breathing at night, cough or hemoptysis. Patient denies any swelling in the lower legs. Patient denies any nausea vomiting, vomiting of blood, or coffee ground material in the vomitus. Patient denies any stomach pain. Patient states has had normal bowel movements no significant constipation or diarrhea. Patient denies any dysuria, hematuria or significant nocturia. Patient denies any problems walking, swelling in the joints or loss of balance. Patient denies any skin changes, loss of hair or loss of weight. Patient denies any excessive worrying or anxiety or significant depression. Patient denies any problems with insomnia. Patient denies excessive thirst, polyuria, polydipsia. Patient denies any swollen glands, patient denies easy bruising or easy bleeding. Patient denies any recent infections, allergies or URI. Patient "s visual fields have not changed significantly in recent time.   PHYSICAL EXAM: BP 122/77   Pulse 93   Temp 97.8 F (36.6 C)   Wt 231 lb 12.8 oz (105.1 kg)   BMI 34.23 kg/m  Patient status post right modified radical mastectomy incisions well-healed no evidence of mass or nodularity mastectomy scars noted.  Left breast is free of dominant mass.  No axillary or supraclavicular adenopathy is identified.  Well-developed well-nourished patient in NAD. HEENT reveals PERLA, EOMI, discs not visualized.  Oral cavity is clear. No oral mucosal lesions are identified. Neck is clear without evidence of cervical or supraclavicular adenopathy.  Lungs are clear to A&P. Cardiac examination is essentially unremarkable with regular rate and rhythm without murmur rub or thrill. Abdomen is benign with no organomegaly or masses noted. Motor sensory and DTR levels are equal and symmetric in the upper and lower extremities. Cranial nerves II through XII are grossly intact. Proprioception is intact. No peripheral adenopathy or edema is identified. No motor or sensory levels are noted. Crude visual fields are within normal range.  LABORATORY DATA: Pathology reports reviewed    RADIOLOGY RESULTS: Mammogram ultrasound reviewed MRI scans requested PET scan requested   IMPRESSION: Stage IIa HER2/neu positive ER/PR negative invasive mammary carcinoma the right breast at [facility] modified radical mastectomy and sentinel node biopsy in 51 year old female  PLAN: At this time based on incomplete axillary dissection positive axillary lymph node and initial findings of multiple abnormal nodes on ultrasound I favor adjuvant radiation therapy.  She is considering reconstruction.  I am debating whether to spare her mastectomy scar and just treat her axilla and will consult with some of my colleagues on this situation.  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  Would at least planned 5040 cGy in 28 fractions to her right axilla.  Risks and benefits of treatment clued skin reaction fatigue alteration of blood counts possible development of lymphedema all were discussed in detail with the patient.  She seems to  comprehend my treatment plan well.  If she is leaning against reconstruction may include her right chest wall.  I have personally scheduled patient for simulation in early August after completion of her chemotherapy.  Patient comprehends my recommendations well.  I would like to take this opportunity to thank you for allowing me to participate in the care of your patient.Noreene Filbert, MD

## 2021-12-10 ENCOUNTER — Ambulatory Visit: Admission: RE | Admit: 2021-12-10 | Payer: 59 | Source: Ambulatory Visit

## 2021-12-10 DIAGNOSIS — Z510 Encounter for antineoplastic radiation therapy: Secondary | ICD-10-CM | POA: Insufficient documentation

## 2021-12-10 DIAGNOSIS — C50111 Malignant neoplasm of central portion of right female breast: Secondary | ICD-10-CM | POA: Diagnosis present

## 2021-12-10 DIAGNOSIS — C773 Secondary and unspecified malignant neoplasm of axilla and upper limb lymph nodes: Secondary | ICD-10-CM | POA: Insufficient documentation

## 2021-12-13 DIAGNOSIS — Z510 Encounter for antineoplastic radiation therapy: Secondary | ICD-10-CM | POA: Diagnosis not present

## 2021-12-14 ENCOUNTER — Other Ambulatory Visit: Payer: Self-pay | Admitting: *Deleted

## 2021-12-14 DIAGNOSIS — C50111 Malignant neoplasm of central portion of right female breast: Secondary | ICD-10-CM

## 2021-12-17 ENCOUNTER — Ambulatory Visit: Admission: RE | Admit: 2021-12-17 | Payer: 59 | Source: Ambulatory Visit

## 2021-12-17 DIAGNOSIS — Z510 Encounter for antineoplastic radiation therapy: Secondary | ICD-10-CM | POA: Diagnosis not present

## 2021-12-18 ENCOUNTER — Ambulatory Visit
Admission: RE | Admit: 2021-12-18 | Discharge: 2021-12-18 | Disposition: A | Discharge status: Home / Self Care | Payer: 59 | Source: Ambulatory Visit | Attending: Radiation Oncology | Admitting: Radiation Oncology

## 2021-12-18 ENCOUNTER — Other Ambulatory Visit: Payer: Self-pay

## 2021-12-18 DIAGNOSIS — Z510 Encounter for antineoplastic radiation therapy: Secondary | ICD-10-CM | POA: Diagnosis not present

## 2021-12-18 LAB — RAD ONC ARIA SESSION SUMMARY
Course Elapsed Days: 0
Plan Fractions Treated to Date: 1
Plan Prescribed Dose Per Fraction: 1.8 Gy
Plan Total Fractions Prescribed: 28
Plan Total Prescribed Dose: 50.4 Gy
Reference Point Dosage Given to Date: 1.8 Gy
Reference Point Session Dosage Given: 1.8 Gy
Session Number: 1

## 2021-12-19 ENCOUNTER — Ambulatory Visit
Admission: RE | Admit: 2021-12-19 | Discharge: 2021-12-19 | Disposition: A | Discharge status: Home / Self Care | Payer: 59 | Source: Ambulatory Visit | Attending: Radiation Oncology | Admitting: Radiation Oncology

## 2021-12-19 ENCOUNTER — Other Ambulatory Visit: Payer: Self-pay

## 2021-12-19 DIAGNOSIS — Z510 Encounter for antineoplastic radiation therapy: Secondary | ICD-10-CM | POA: Diagnosis not present

## 2021-12-19 LAB — RAD ONC ARIA SESSION SUMMARY
Course Elapsed Days: 1
Plan Fractions Treated to Date: 2
Plan Prescribed Dose Per Fraction: 1.8 Gy
Plan Total Fractions Prescribed: 28
Plan Total Prescribed Dose: 50.4 Gy
Reference Point Dosage Given to Date: 3.6 Gy
Reference Point Session Dosage Given: 1.8 Gy
Session Number: 2

## 2021-12-20 ENCOUNTER — Ambulatory Visit
Admission: RE | Admit: 2021-12-20 | Discharge: 2021-12-20 | Disposition: A | Discharge status: Home / Self Care | Payer: 59 | Source: Ambulatory Visit | Attending: Radiation Oncology | Admitting: Radiation Oncology

## 2021-12-20 ENCOUNTER — Other Ambulatory Visit: Payer: Self-pay

## 2021-12-20 DIAGNOSIS — Z510 Encounter for antineoplastic radiation therapy: Secondary | ICD-10-CM | POA: Diagnosis not present

## 2021-12-20 LAB — RAD ONC ARIA SESSION SUMMARY
Course Elapsed Days: 2
Plan Fractions Treated to Date: 3
Plan Prescribed Dose Per Fraction: 1.8 Gy
Plan Total Fractions Prescribed: 28
Plan Total Prescribed Dose: 50.4 Gy
Reference Point Dosage Given to Date: 5.4 Gy
Reference Point Session Dosage Given: 1.8 Gy
Session Number: 3

## 2021-12-21 ENCOUNTER — Ambulatory Visit
Admission: RE | Admit: 2021-12-21 | Discharge: 2021-12-21 | Disposition: A | Discharge status: Home / Self Care | Payer: 59 | Source: Ambulatory Visit | Attending: Radiation Oncology | Admitting: Radiation Oncology

## 2021-12-21 ENCOUNTER — Other Ambulatory Visit: Payer: Self-pay

## 2021-12-21 DIAGNOSIS — Z510 Encounter for antineoplastic radiation therapy: Secondary | ICD-10-CM | POA: Diagnosis not present

## 2021-12-21 LAB — RAD ONC ARIA SESSION SUMMARY
Course Elapsed Days: 3
Plan Fractions Treated to Date: 4
Plan Prescribed Dose Per Fraction: 1.8 Gy
Plan Total Fractions Prescribed: 28
Plan Total Prescribed Dose: 50.4 Gy
Reference Point Dosage Given to Date: 7.2 Gy
Reference Point Session Dosage Given: 1.8 Gy
Session Number: 4

## 2021-12-24 ENCOUNTER — Other Ambulatory Visit: Payer: Self-pay

## 2021-12-24 ENCOUNTER — Ambulatory Visit
Admission: RE | Admit: 2021-12-24 | Discharge: 2021-12-24 | Disposition: A | Discharge status: Home / Self Care | Payer: 59 | Source: Ambulatory Visit | Attending: Radiation Oncology | Admitting: Radiation Oncology

## 2021-12-24 DIAGNOSIS — Z510 Encounter for antineoplastic radiation therapy: Secondary | ICD-10-CM | POA: Diagnosis not present

## 2021-12-24 LAB — RAD ONC ARIA SESSION SUMMARY
Course Elapsed Days: 6
Plan Fractions Treated to Date: 5
Plan Prescribed Dose Per Fraction: 1.8 Gy
Plan Total Fractions Prescribed: 28
Plan Total Prescribed Dose: 50.4 Gy
Reference Point Dosage Given to Date: 9 Gy
Reference Point Session Dosage Given: 1.8 Gy
Session Number: 5

## 2021-12-25 ENCOUNTER — Other Ambulatory Visit: Payer: Self-pay

## 2021-12-25 ENCOUNTER — Ambulatory Visit
Admission: RE | Admit: 2021-12-25 | Discharge: 2021-12-25 | Disposition: A | Discharge status: Home / Self Care | Payer: 59 | Source: Ambulatory Visit | Attending: Radiation Oncology | Admitting: Radiation Oncology

## 2021-12-25 DIAGNOSIS — Z510 Encounter for antineoplastic radiation therapy: Secondary | ICD-10-CM | POA: Diagnosis not present

## 2021-12-25 LAB — RAD ONC ARIA SESSION SUMMARY
Course Elapsed Days: 7
Plan Fractions Treated to Date: 6
Plan Prescribed Dose Per Fraction: 1.8 Gy
Plan Total Fractions Prescribed: 28
Plan Total Prescribed Dose: 50.4 Gy
Reference Point Dosage Given to Date: 10.8 Gy
Reference Point Session Dosage Given: 1.8 Gy
Session Number: 6

## 2021-12-26 ENCOUNTER — Ambulatory Visit
Admission: RE | Admit: 2021-12-26 | Discharge: 2021-12-26 | Disposition: A | Discharge status: Home / Self Care | Payer: 59 | Source: Ambulatory Visit | Attending: Radiation Oncology | Admitting: Radiation Oncology

## 2021-12-26 ENCOUNTER — Other Ambulatory Visit: Payer: Self-pay

## 2021-12-26 DIAGNOSIS — Z510 Encounter for antineoplastic radiation therapy: Secondary | ICD-10-CM | POA: Diagnosis not present

## 2021-12-26 LAB — RAD ONC ARIA SESSION SUMMARY
Course Elapsed Days: 8
Plan Fractions Treated to Date: 7
Plan Prescribed Dose Per Fraction: 1.8 Gy
Plan Total Fractions Prescribed: 28
Plan Total Prescribed Dose: 50.4 Gy
Reference Point Dosage Given to Date: 12.6 Gy
Reference Point Session Dosage Given: 1.8 Gy
Session Number: 7

## 2021-12-27 ENCOUNTER — Other Ambulatory Visit: Payer: Self-pay

## 2021-12-27 ENCOUNTER — Ambulatory Visit
Admission: RE | Admit: 2021-12-27 | Discharge: 2021-12-27 | Disposition: A | Discharge status: Home / Self Care | Payer: 59 | Source: Ambulatory Visit | Attending: Radiation Oncology | Admitting: Radiation Oncology

## 2021-12-27 DIAGNOSIS — Z510 Encounter for antineoplastic radiation therapy: Secondary | ICD-10-CM | POA: Diagnosis not present

## 2021-12-27 LAB — RAD ONC ARIA SESSION SUMMARY
Course Elapsed Days: 9
Plan Fractions Treated to Date: 8
Plan Prescribed Dose Per Fraction: 1.8 Gy
Plan Total Fractions Prescribed: 28
Plan Total Prescribed Dose: 50.4 Gy
Reference Point Dosage Given to Date: 14.4 Gy
Reference Point Session Dosage Given: 1.8 Gy
Session Number: 8

## 2021-12-28 ENCOUNTER — Other Ambulatory Visit: Payer: Self-pay

## 2021-12-28 ENCOUNTER — Ambulatory Visit
Admission: RE | Admit: 2021-12-28 | Discharge: 2021-12-28 | Disposition: A | Discharge status: Home / Self Care | Payer: 59 | Source: Ambulatory Visit | Attending: Radiation Oncology | Admitting: Radiation Oncology

## 2021-12-28 ENCOUNTER — Telehealth: Payer: Self-pay | Admitting: Internal Medicine

## 2021-12-28 DIAGNOSIS — Z510 Encounter for antineoplastic radiation therapy: Secondary | ICD-10-CM | POA: Diagnosis not present

## 2021-12-28 LAB — RAD ONC ARIA SESSION SUMMARY
Course Elapsed Days: 10
Plan Fractions Treated to Date: 9
Plan Prescribed Dose Per Fraction: 1.8 Gy
Plan Total Fractions Prescribed: 28
Plan Total Prescribed Dose: 50.4 Gy
Reference Point Dosage Given to Date: 16.2 Gy
Reference Point Session Dosage Given: 1.8 Gy
Session Number: 9

## 2021-12-28 NOTE — Telephone Encounter (Signed)
Patient called and stated that she would like to cancel her appointment with Dr. Rogue Bussing next week (purpose of this visit was to transfer care over from Dr. Janese Banks). She stated that she would call back once she decides "what direction to go".

## 2021-12-31 ENCOUNTER — Inpatient Hospital Stay: Payer: 59

## 2021-12-31 ENCOUNTER — Ambulatory Visit
Admission: RE | Admit: 2021-12-31 | Discharge: 2021-12-31 | Disposition: A | Discharge status: Home / Self Care | Payer: 59 | Source: Ambulatory Visit | Attending: Radiation Oncology | Admitting: Radiation Oncology

## 2021-12-31 ENCOUNTER — Other Ambulatory Visit: Payer: Self-pay

## 2021-12-31 DIAGNOSIS — Z171 Estrogen receptor negative status [ER-]: Secondary | ICD-10-CM

## 2021-12-31 DIAGNOSIS — C50111 Malignant neoplasm of central portion of right female breast: Secondary | ICD-10-CM | POA: Insufficient documentation

## 2021-12-31 DIAGNOSIS — Z510 Encounter for antineoplastic radiation therapy: Secondary | ICD-10-CM | POA: Diagnosis not present

## 2021-12-31 LAB — RAD ONC ARIA SESSION SUMMARY
Course Elapsed Days: 13
Plan Fractions Treated to Date: 10
Plan Prescribed Dose Per Fraction: 1.8 Gy
Plan Total Fractions Prescribed: 28
Plan Total Prescribed Dose: 50.4 Gy
Reference Point Dosage Given to Date: 18 Gy
Reference Point Session Dosage Given: 1.8 Gy
Session Number: 10

## 2021-12-31 LAB — CBC
HCT: 33.4 % — ABNORMAL LOW (ref 36.0–46.0)
Hemoglobin: 10.4 g/dL — ABNORMAL LOW (ref 12.0–15.0)
MCH: 29.1 pg (ref 26.0–34.0)
MCHC: 31.1 g/dL (ref 30.0–36.0)
MCV: 93.3 fL (ref 80.0–100.0)
Platelets: 314 10*3/uL (ref 150–400)
RBC: 3.58 MIL/uL — ABNORMAL LOW (ref 3.87–5.11)
RDW: 14.8 % (ref 11.5–15.5)
WBC: 5.4 10*3/uL (ref 4.0–10.5)
nRBC: 0 % (ref 0.0–0.2)

## 2022-01-01 ENCOUNTER — Other Ambulatory Visit: Payer: Self-pay

## 2022-01-01 ENCOUNTER — Ambulatory Visit: Payer: 59 | Admitting: Internal Medicine

## 2022-01-01 ENCOUNTER — Ambulatory Visit
Admission: RE | Admit: 2022-01-01 | Discharge: 2022-01-01 | Disposition: A | Discharge status: Home / Self Care | Payer: 59 | Source: Ambulatory Visit | Attending: Radiation Oncology | Admitting: Radiation Oncology

## 2022-01-01 DIAGNOSIS — Z510 Encounter for antineoplastic radiation therapy: Secondary | ICD-10-CM | POA: Diagnosis not present

## 2022-01-01 LAB — RAD ONC ARIA SESSION SUMMARY
Course Elapsed Days: 14
Plan Fractions Treated to Date: 11
Plan Prescribed Dose Per Fraction: 1.8 Gy
Plan Total Fractions Prescribed: 28
Plan Total Prescribed Dose: 50.4 Gy
Reference Point Dosage Given to Date: 19.8 Gy
Reference Point Session Dosage Given: 1.8 Gy
Session Number: 11

## 2022-01-02 ENCOUNTER — Other Ambulatory Visit: Payer: Self-pay

## 2022-01-02 ENCOUNTER — Ambulatory Visit
Admission: RE | Admit: 2022-01-02 | Discharge: 2022-01-02 | Disposition: A | Discharge status: Home / Self Care | Payer: 59 | Source: Ambulatory Visit | Attending: Radiation Oncology | Admitting: Radiation Oncology

## 2022-01-02 DIAGNOSIS — Z510 Encounter for antineoplastic radiation therapy: Secondary | ICD-10-CM | POA: Diagnosis not present

## 2022-01-02 LAB — RAD ONC ARIA SESSION SUMMARY
Course Elapsed Days: 15
Plan Fractions Treated to Date: 12
Plan Prescribed Dose Per Fraction: 1.8 Gy
Plan Total Fractions Prescribed: 28
Plan Total Prescribed Dose: 50.4 Gy
Reference Point Dosage Given to Date: 21.6 Gy
Reference Point Session Dosage Given: 1.8 Gy
Session Number: 12

## 2022-01-03 ENCOUNTER — Ambulatory Visit
Admission: RE | Admit: 2022-01-03 | Discharge: 2022-01-03 | Disposition: A | Discharge status: Home / Self Care | Payer: 59 | Source: Ambulatory Visit | Attending: Radiation Oncology | Admitting: Radiation Oncology

## 2022-01-03 ENCOUNTER — Other Ambulatory Visit: Payer: Self-pay

## 2022-01-03 DIAGNOSIS — Z510 Encounter for antineoplastic radiation therapy: Secondary | ICD-10-CM | POA: Diagnosis not present

## 2022-01-03 LAB — RAD ONC ARIA SESSION SUMMARY
Course Elapsed Days: 16
Plan Fractions Treated to Date: 13
Plan Prescribed Dose Per Fraction: 1.8 Gy
Plan Total Fractions Prescribed: 28
Plan Total Prescribed Dose: 50.4 Gy
Reference Point Dosage Given to Date: 23.4 Gy
Reference Point Session Dosage Given: 1.8 Gy
Session Number: 13

## 2022-01-04 ENCOUNTER — Other Ambulatory Visit: Payer: Self-pay

## 2022-01-04 ENCOUNTER — Ambulatory Visit
Admission: RE | Admit: 2022-01-04 | Discharge: 2022-01-04 | Disposition: A | Discharge status: Home / Self Care | Payer: 59 | Source: Ambulatory Visit | Attending: Radiation Oncology | Admitting: Radiation Oncology

## 2022-01-04 DIAGNOSIS — Z510 Encounter for antineoplastic radiation therapy: Secondary | ICD-10-CM | POA: Insufficient documentation

## 2022-01-04 DIAGNOSIS — Z171 Estrogen receptor negative status [ER-]: Secondary | ICD-10-CM | POA: Insufficient documentation

## 2022-01-04 DIAGNOSIS — C773 Secondary and unspecified malignant neoplasm of axilla and upper limb lymph nodes: Secondary | ICD-10-CM | POA: Insufficient documentation

## 2022-01-04 DIAGNOSIS — C7A8 Other malignant neuroendocrine tumors: Secondary | ICD-10-CM | POA: Diagnosis not present

## 2022-01-04 DIAGNOSIS — C50111 Malignant neoplasm of central portion of right female breast: Secondary | ICD-10-CM | POA: Insufficient documentation

## 2022-01-04 LAB — RAD ONC ARIA SESSION SUMMARY
Course Elapsed Days: 17
Plan Fractions Treated to Date: 14
Plan Prescribed Dose Per Fraction: 1.8 Gy
Plan Total Fractions Prescribed: 28
Plan Total Prescribed Dose: 50.4 Gy
Reference Point Dosage Given to Date: 25.2 Gy
Reference Point Session Dosage Given: 1.8 Gy
Session Number: 14

## 2022-01-08 ENCOUNTER — Ambulatory Visit
Admission: RE | Admit: 2022-01-08 | Discharge: 2022-01-08 | Disposition: A | Discharge status: Home / Self Care | Payer: 59 | Source: Ambulatory Visit | Attending: Radiation Oncology | Admitting: Radiation Oncology

## 2022-01-08 ENCOUNTER — Other Ambulatory Visit: Payer: Self-pay

## 2022-01-08 DIAGNOSIS — Z510 Encounter for antineoplastic radiation therapy: Secondary | ICD-10-CM | POA: Diagnosis not present

## 2022-01-08 LAB — RAD ONC ARIA SESSION SUMMARY
Course Elapsed Days: 21
Plan Fractions Treated to Date: 15
Plan Prescribed Dose Per Fraction: 1.8 Gy
Plan Total Fractions Prescribed: 28
Plan Total Prescribed Dose: 50.4 Gy
Reference Point Dosage Given to Date: 27 Gy
Reference Point Session Dosage Given: 1.8 Gy
Session Number: 15

## 2022-01-09 ENCOUNTER — Ambulatory Visit
Admission: RE | Admit: 2022-01-09 | Discharge: 2022-01-09 | Disposition: A | Discharge status: Home / Self Care | Payer: 59 | Source: Ambulatory Visit | Attending: Radiation Oncology | Admitting: Radiation Oncology

## 2022-01-09 ENCOUNTER — Other Ambulatory Visit: Payer: Self-pay

## 2022-01-09 DIAGNOSIS — Z510 Encounter for antineoplastic radiation therapy: Secondary | ICD-10-CM | POA: Diagnosis not present

## 2022-01-09 LAB — RAD ONC ARIA SESSION SUMMARY
Course Elapsed Days: 22
Plan Fractions Treated to Date: 16
Plan Prescribed Dose Per Fraction: 1.8 Gy
Plan Total Fractions Prescribed: 28
Plan Total Prescribed Dose: 50.4 Gy
Reference Point Dosage Given to Date: 28.8 Gy
Reference Point Session Dosage Given: 1.8 Gy
Session Number: 16

## 2022-01-10 ENCOUNTER — Ambulatory Visit
Admission: RE | Admit: 2022-01-10 | Discharge: 2022-01-10 | Disposition: A | Discharge status: Home / Self Care | Payer: 59 | Source: Ambulatory Visit | Attending: Radiation Oncology | Admitting: Radiation Oncology

## 2022-01-10 ENCOUNTER — Other Ambulatory Visit: Payer: Self-pay

## 2022-01-10 DIAGNOSIS — Z510 Encounter for antineoplastic radiation therapy: Secondary | ICD-10-CM | POA: Diagnosis not present

## 2022-01-10 LAB — RAD ONC ARIA SESSION SUMMARY
Course Elapsed Days: 23
Plan Fractions Treated to Date: 17
Plan Prescribed Dose Per Fraction: 1.8 Gy
Plan Total Fractions Prescribed: 28
Plan Total Prescribed Dose: 50.4 Gy
Reference Point Dosage Given to Date: 30.6 Gy
Reference Point Session Dosage Given: 1.8 Gy
Session Number: 17

## 2022-01-11 ENCOUNTER — Ambulatory Visit
Admission: RE | Admit: 2022-01-11 | Discharge: 2022-01-11 | Disposition: A | Discharge status: Home / Self Care | Payer: 59 | Source: Ambulatory Visit | Attending: Radiation Oncology | Admitting: Radiation Oncology

## 2022-01-11 ENCOUNTER — Other Ambulatory Visit: Payer: Self-pay

## 2022-01-11 DIAGNOSIS — Z510 Encounter for antineoplastic radiation therapy: Secondary | ICD-10-CM | POA: Diagnosis not present

## 2022-01-11 LAB — RAD ONC ARIA SESSION SUMMARY
Course Elapsed Days: 24
Plan Fractions Treated to Date: 18
Plan Prescribed Dose Per Fraction: 1.8 Gy
Plan Total Fractions Prescribed: 28
Plan Total Prescribed Dose: 50.4 Gy
Reference Point Dosage Given to Date: 32.4 Gy
Reference Point Session Dosage Given: 1.8 Gy
Session Number: 18

## 2022-01-14 ENCOUNTER — Other Ambulatory Visit: Payer: Self-pay

## 2022-01-14 ENCOUNTER — Inpatient Hospital Stay: Payer: 59 | Attending: Radiation Oncology

## 2022-01-14 ENCOUNTER — Ambulatory Visit
Admission: RE | Admit: 2022-01-14 | Discharge: 2022-01-14 | Disposition: A | Discharge status: Home / Self Care | Payer: 59 | Source: Ambulatory Visit | Attending: Radiation Oncology | Admitting: Radiation Oncology

## 2022-01-14 DIAGNOSIS — Z510 Encounter for antineoplastic radiation therapy: Secondary | ICD-10-CM | POA: Diagnosis not present

## 2022-01-14 DIAGNOSIS — C50111 Malignant neoplasm of central portion of right female breast: Secondary | ICD-10-CM

## 2022-01-14 LAB — RAD ONC ARIA SESSION SUMMARY
Course Elapsed Days: 27
Plan Fractions Treated to Date: 19
Plan Prescribed Dose Per Fraction: 1.8 Gy
Plan Total Fractions Prescribed: 28
Plan Total Prescribed Dose: 50.4 Gy
Reference Point Dosage Given to Date: 34.2 Gy
Reference Point Session Dosage Given: 1.8 Gy
Session Number: 19

## 2022-01-14 LAB — CBC
HCT: 36.3 % (ref 36.0–46.0)
Hemoglobin: 11.6 g/dL — ABNORMAL LOW (ref 12.0–15.0)
MCH: 29.4 pg (ref 26.0–34.0)
MCHC: 32 g/dL (ref 30.0–36.0)
MCV: 92.1 fL (ref 80.0–100.0)
Platelets: 231 10*3/uL (ref 150–400)
RBC: 3.94 MIL/uL (ref 3.87–5.11)
RDW: 14.1 % (ref 11.5–15.5)
WBC: 5.6 10*3/uL (ref 4.0–10.5)
nRBC: 0 % (ref 0.0–0.2)

## 2022-01-15 ENCOUNTER — Other Ambulatory Visit: Payer: Self-pay

## 2022-01-15 ENCOUNTER — Ambulatory Visit
Admission: RE | Admit: 2022-01-15 | Discharge: 2022-01-15 | Disposition: A | Discharge status: Home / Self Care | Payer: 59 | Source: Ambulatory Visit | Attending: Radiation Oncology | Admitting: Radiation Oncology

## 2022-01-15 DIAGNOSIS — Z510 Encounter for antineoplastic radiation therapy: Secondary | ICD-10-CM | POA: Diagnosis not present

## 2022-01-15 LAB — RAD ONC ARIA SESSION SUMMARY
Course Elapsed Days: 28
Plan Fractions Treated to Date: 20
Plan Prescribed Dose Per Fraction: 1.8 Gy
Plan Total Fractions Prescribed: 28
Plan Total Prescribed Dose: 50.4 Gy
Reference Point Dosage Given to Date: 36 Gy
Reference Point Session Dosage Given: 1.8 Gy
Session Number: 20

## 2022-01-16 ENCOUNTER — Other Ambulatory Visit: Payer: Self-pay

## 2022-01-16 ENCOUNTER — Ambulatory Visit
Admission: RE | Admit: 2022-01-16 | Discharge: 2022-01-16 | Disposition: A | Discharge status: Home / Self Care | Payer: 59 | Source: Ambulatory Visit | Attending: Radiation Oncology | Admitting: Radiation Oncology

## 2022-01-16 DIAGNOSIS — Z510 Encounter for antineoplastic radiation therapy: Secondary | ICD-10-CM | POA: Diagnosis not present

## 2022-01-16 LAB — RAD ONC ARIA SESSION SUMMARY
Course Elapsed Days: 29
Plan Fractions Treated to Date: 21
Plan Prescribed Dose Per Fraction: 1.8 Gy
Plan Total Fractions Prescribed: 28
Plan Total Prescribed Dose: 50.4 Gy
Reference Point Dosage Given to Date: 37.8 Gy
Reference Point Session Dosage Given: 1.8 Gy
Session Number: 21

## 2022-01-17 ENCOUNTER — Ambulatory Visit
Admission: RE | Admit: 2022-01-17 | Discharge: 2022-01-17 | Disposition: A | Discharge status: Home / Self Care | Payer: 59 | Source: Ambulatory Visit | Attending: Radiation Oncology | Admitting: Radiation Oncology

## 2022-01-17 ENCOUNTER — Other Ambulatory Visit: Payer: Self-pay

## 2022-01-17 DIAGNOSIS — Z510 Encounter for antineoplastic radiation therapy: Secondary | ICD-10-CM | POA: Diagnosis not present

## 2022-01-17 LAB — RAD ONC ARIA SESSION SUMMARY
Course Elapsed Days: 30
Plan Fractions Treated to Date: 22
Plan Prescribed Dose Per Fraction: 1.8 Gy
Plan Total Fractions Prescribed: 28
Plan Total Prescribed Dose: 50.4 Gy
Reference Point Dosage Given to Date: 39.6 Gy
Reference Point Session Dosage Given: 1.8 Gy
Session Number: 22

## 2022-01-18 ENCOUNTER — Ambulatory Visit: Payer: 59

## 2022-01-21 ENCOUNTER — Other Ambulatory Visit: Payer: Self-pay

## 2022-01-21 ENCOUNTER — Ambulatory Visit
Admission: RE | Admit: 2022-01-21 | Discharge: 2022-01-21 | Disposition: A | Discharge status: Home / Self Care | Payer: 59 | Source: Ambulatory Visit | Attending: Radiation Oncology | Admitting: Radiation Oncology

## 2022-01-21 DIAGNOSIS — Z510 Encounter for antineoplastic radiation therapy: Secondary | ICD-10-CM | POA: Diagnosis not present

## 2022-01-21 LAB — RAD ONC ARIA SESSION SUMMARY
Course Elapsed Days: 34
Plan Fractions Treated to Date: 23
Plan Prescribed Dose Per Fraction: 1.8 Gy
Plan Total Fractions Prescribed: 28
Plan Total Prescribed Dose: 50.4 Gy
Reference Point Dosage Given to Date: 41.4 Gy
Reference Point Session Dosage Given: 1.8 Gy
Session Number: 23

## 2022-01-22 ENCOUNTER — Other Ambulatory Visit: Payer: Self-pay

## 2022-01-22 ENCOUNTER — Ambulatory Visit
Admission: RE | Admit: 2022-01-22 | Discharge: 2022-01-22 | Disposition: A | Discharge status: Home / Self Care | Payer: 59 | Source: Ambulatory Visit | Attending: Radiation Oncology | Admitting: Radiation Oncology

## 2022-01-22 DIAGNOSIS — Z510 Encounter for antineoplastic radiation therapy: Secondary | ICD-10-CM | POA: Diagnosis not present

## 2022-01-22 LAB — RAD ONC ARIA SESSION SUMMARY
Course Elapsed Days: 35
Plan Fractions Treated to Date: 24
Plan Prescribed Dose Per Fraction: 1.8 Gy
Plan Total Fractions Prescribed: 28
Plan Total Prescribed Dose: 50.4 Gy
Reference Point Dosage Given to Date: 43.2 Gy
Reference Point Session Dosage Given: 1.8 Gy
Session Number: 24

## 2022-01-23 ENCOUNTER — Other Ambulatory Visit: Payer: Self-pay

## 2022-01-23 ENCOUNTER — Inpatient Hospital Stay: Payer: 59

## 2022-01-23 ENCOUNTER — Ambulatory Visit
Admission: RE | Admit: 2022-01-23 | Discharge: 2022-01-23 | Disposition: A | Discharge status: Home / Self Care | Payer: 59 | Source: Ambulatory Visit | Attending: Radiation Oncology | Admitting: Radiation Oncology

## 2022-01-23 DIAGNOSIS — Z510 Encounter for antineoplastic radiation therapy: Secondary | ICD-10-CM | POA: Diagnosis not present

## 2022-01-23 LAB — RAD ONC ARIA SESSION SUMMARY
Course Elapsed Days: 36
Plan Fractions Treated to Date: 25
Plan Prescribed Dose Per Fraction: 1.8 Gy
Plan Total Fractions Prescribed: 28
Plan Total Prescribed Dose: 50.4 Gy
Reference Point Dosage Given to Date: 45 Gy
Reference Point Session Dosage Given: 1.8 Gy
Session Number: 25

## 2022-01-24 ENCOUNTER — Other Ambulatory Visit: Payer: Self-pay

## 2022-01-24 ENCOUNTER — Ambulatory Visit
Admission: RE | Admit: 2022-01-24 | Discharge: 2022-01-24 | Disposition: A | Discharge status: Home / Self Care | Payer: 59 | Source: Ambulatory Visit | Attending: Radiation Oncology | Admitting: Radiation Oncology

## 2022-01-24 DIAGNOSIS — Z510 Encounter for antineoplastic radiation therapy: Secondary | ICD-10-CM | POA: Diagnosis not present

## 2022-01-24 LAB — RAD ONC ARIA SESSION SUMMARY
Course Elapsed Days: 37
Plan Fractions Treated to Date: 26
Plan Prescribed Dose Per Fraction: 1.8 Gy
Plan Total Fractions Prescribed: 28
Plan Total Prescribed Dose: 50.4 Gy
Reference Point Dosage Given to Date: 46.8 Gy
Reference Point Session Dosage Given: 1.8 Gy
Session Number: 26

## 2022-01-25 ENCOUNTER — Ambulatory Visit
Admission: RE | Admit: 2022-01-25 | Discharge: 2022-01-25 | Disposition: A | Discharge status: Home / Self Care | Payer: 59 | Source: Ambulatory Visit | Attending: Radiation Oncology | Admitting: Radiation Oncology

## 2022-01-25 ENCOUNTER — Other Ambulatory Visit: Payer: Self-pay

## 2022-01-25 ENCOUNTER — Ambulatory Visit: Payer: 59

## 2022-01-25 DIAGNOSIS — Z510 Encounter for antineoplastic radiation therapy: Secondary | ICD-10-CM | POA: Diagnosis not present

## 2022-01-25 LAB — RAD ONC ARIA SESSION SUMMARY
Course Elapsed Days: 38
Plan Fractions Treated to Date: 27
Plan Prescribed Dose Per Fraction: 1.8 Gy
Plan Total Fractions Prescribed: 28
Plan Total Prescribed Dose: 50.4 Gy
Reference Point Dosage Given to Date: 48.6 Gy
Reference Point Session Dosage Given: 1.8 Gy
Session Number: 27

## 2022-01-28 ENCOUNTER — Ambulatory Visit: Payer: 59

## 2022-01-28 ENCOUNTER — Ambulatory Visit
Admission: RE | Admit: 2022-01-28 | Discharge: 2022-01-28 | Disposition: A | Discharge status: Home / Self Care | Payer: 59 | Source: Ambulatory Visit | Attending: Radiation Oncology | Admitting: Radiation Oncology

## 2022-01-28 ENCOUNTER — Other Ambulatory Visit: Payer: Self-pay

## 2022-01-28 DIAGNOSIS — Z510 Encounter for antineoplastic radiation therapy: Secondary | ICD-10-CM | POA: Diagnosis not present

## 2022-01-28 LAB — RAD ONC ARIA SESSION SUMMARY
Course Elapsed Days: 41
Plan Fractions Treated to Date: 28
Plan Prescribed Dose Per Fraction: 1.8 Gy
Plan Total Fractions Prescribed: 28
Plan Total Prescribed Dose: 50.4 Gy
Reference Point Dosage Given to Date: 50.4 Gy
Reference Point Session Dosage Given: 1.8 Gy
Session Number: 28

## 2022-01-30 ENCOUNTER — Inpatient Hospital Stay: Payer: 59

## 2022-01-30 ENCOUNTER — Inpatient Hospital Stay: Payer: 59 | Admitting: Internal Medicine

## 2022-01-30 ENCOUNTER — Encounter: Payer: Self-pay | Admitting: Internal Medicine

## 2022-01-30 VITALS — BP 122/71 | HR 83 | Temp 97.8°F | Resp 20 | Ht 69.0 in | Wt 236.6 lb

## 2022-01-30 DIAGNOSIS — Z510 Encounter for antineoplastic radiation therapy: Secondary | ICD-10-CM | POA: Diagnosis not present

## 2022-01-30 DIAGNOSIS — Z171 Estrogen receptor negative status [ER-]: Secondary | ICD-10-CM | POA: Diagnosis not present

## 2022-01-30 DIAGNOSIS — C50111 Malignant neoplasm of central portion of right female breast: Secondary | ICD-10-CM

## 2022-01-30 LAB — COMPREHENSIVE METABOLIC PANEL
ALT: 11 U/L (ref 0–44)
AST: 20 U/L (ref 15–41)
Albumin: 4.1 g/dL (ref 3.5–5.0)
Alkaline Phosphatase: 62 U/L (ref 38–126)
Anion gap: 6 (ref 5–15)
BUN: 9 mg/dL (ref 6–20)
CO2: 25 mmol/L (ref 22–32)
Calcium: 9.1 mg/dL (ref 8.9–10.3)
Chloride: 108 mmol/L (ref 98–111)
Creatinine, Ser: 0.64 mg/dL (ref 0.44–1.00)
GFR, Estimated: >60 mL/min (ref >60)
Glucose, Bld: 114 mg/dL — ABNORMAL HIGH (ref 70–99)
Potassium: 4 mmol/L (ref 3.5–5.1)
Sodium: 139 mmol/L (ref 135–145)
Total Bilirubin: 0.4 mg/dL (ref 0.3–1.2)
Total Protein: 7.7 g/dL (ref 6.5–8.1)

## 2022-01-30 LAB — CBC WITH DIFFERENTIAL/PLATELET
Abs Immature Granulocytes: 0.01 10*3/uL (ref 0.00–0.07)
Basophils Absolute: 0.1 10*3/uL (ref 0.0–0.1)
Basophils Relative: 1 %
Eosinophils Absolute: 0.3 10*3/uL (ref 0.0–0.5)
Eosinophils Relative: 6 %
HCT: 35.6 % — ABNORMAL LOW (ref 36.0–46.0)
Hemoglobin: 11.7 g/dL — ABNORMAL LOW (ref 12.0–15.0)
Immature Granulocytes: 0 %
Lymphocytes Relative: 25 %
Lymphs Abs: 1.3 10*3/uL (ref 0.7–4.0)
MCH: 29.1 pg (ref 26.0–34.0)
MCHC: 32.9 g/dL (ref 30.0–36.0)
MCV: 88.6 fL (ref 80.0–100.0)
Monocytes Absolute: 0.4 10*3/uL (ref 0.1–1.0)
Monocytes Relative: 7 %
Neutro Abs: 3.2 10*3/uL (ref 1.7–7.7)
Neutrophils Relative %: 61 %
Platelets: 257 10*3/uL (ref 150–400)
RBC: 4.02 MIL/uL (ref 3.87–5.11)
RDW: 13.4 % (ref 11.5–15.5)
WBC: 5.3 10*3/uL (ref 4.0–10.5)
nRBC: 0 % (ref 0.0–0.2)

## 2022-01-30 LAB — MAGNESIUM: Magnesium: 1.6 mg/dL — ABNORMAL LOW (ref 1.7–2.4)

## 2022-01-30 NOTE — Progress Notes (Signed)
START ON PATHWAY REGIMEN - Breast     Cycle 1: A cycle is 21 days:     Pertuzumab-Trastuzumab-hyaluronidase-zzxf      Docetaxel      Carboplatin    Cycles 2 through 6: A cycle is every 21 days:     Pertuzumab-Trastuzumab-hyaluronidase-zzxf      Docetaxel      Carboplatin    Cycles 7 through 17: A cycle is every 21 days:     Pertuzumab-Trastuzumab-hyaluronidase-zzxf   **Always confirm dose/schedule in your pharmacy ordering system**  Patient Characteristics: Postoperative without Neoadjuvant Therapy (Pathologic Staging), Invasive Disease, Adjuvant Therapy, HER2 Positive, ER Negative/Unknown, Node Positive, pT1, pN1a or Higher Therapeutic Status: Postoperative without Neoadjuvant Therapy (Pathologic Staging) AJCC Grade: G3 AJCC N Category: pN1 AJCC M Category: cM0 ER Status: Negative (-) AJCC 8 Stage Grouping: IIA HER2 Status: Positive (+) Oncotype Dx Recurrence Score: Not Appropriate AJCC T Category: pT1c PR Status: Negative (-) Adjuvant Therapy Status: No Adjuvant Therapy Received Yet or Changing Initial Adjuvant Regimen due to Tolerance Intent of Therapy: Curative Intent, Discussed with Patient

## 2022-01-30 NOTE — Progress Notes (Unsigned)
Patient states she is having some Post chemo leg cramping.

## 2022-01-30 NOTE — Progress Notes (Unsigned)
[facility] CONSULT NOTE  Patient Care Team: Pcp, No as PCP - General Byrnett, Forest Gleason, MD as Consulting Physician (General Surgery)  CHIEF COMPLAINTS/PURPOSE OF CONSULTATION: breast cancer   Oncology History Overview Note  Final Diagnosis    1.  Lymph node, right axilla, excision:   -One lymph node, negative for carcinoma (0/1).   2. Sentinel lymph node, right axilla #1, excision:   -One lymph node, negative for carcinoma; multiple levels examined (0/1).   3. Sentinel lymph node, right axilla #2, excision:   -One lymph node, negative for carcinoma; multiple levels examined (0/1).   4. Sentinel lymph node, right axilla #3, excision:   -Two lymph nodes, negative for carcinoma; multiple levels examined (0/2).   5.  Lymph node, right axilla, excision:   -One lymph node, positive for carcinoma (1/1). -Metastatic focus measures greater than 3 mm. -No extranodal extension is seen.   6.  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 -Invasive focus measures 12 mm microscopically (measured on slide 6E). 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 -Margins are negative for in situ and invasive carcinoma; closest is posterior at 7 mm and greater than 10 mm respectively. -Usual ductal hyperplasia, cystic apocrine metaplasia, fibroadenomatoid changes and biopsy site changes present. -pT1c ([id number] M-not applicable; AJCC stage IIA. -Please see synoptic report below.    # 04/20/2021: [Dr.Rao- ]Right breast invasive mammary carcinoma; declined neoadjuvant chemotherapy.  S/p mastectomy and SNL, pT1c(m) pN1a, ER low positive, PR negative and HER2 amplified-stage II.  Currently status post   adjuvant TCH-P x6 cycles [last 72/27]; Dr.Chrystal- [s/p  01/28/2022]. Phesgo cycle 1 day 1   [01/18/2022]  # OCT 6th, 2023- Phesgo every 3 weeks for total of 11 more cycles. [Total of 1 year].   # 07/13/2021: Status post FNA left lung nodule showing neoplastic cells consistent with low-grade neuroendocrine tumor [[facility]]. 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 Awaiting  PET/CT scan per Dr. Kern Alberta.   # PCOS/DM-sec to steroids- metformin   Malignant neoplasm of central portion of right breast in female, estrogen receptor negative ([place])  05/11/2021 Cancer Staging   Staging form: Breast, AJCC 8th Edition - Pathologic stage from 05/11/2021: Stage IIA (pT1c, pN1a(sn), cM0, G3, ER-, PR-, HER2+) - Signed by Sindy Guadeloupe, MD on 05/20/2021 Stage prefix: Initial diagnosis Method of lymph node assessment: Sentinel lymph node biopsy Multigene prognostic tests performed: None Histologic grading system: 3 grade system Residual tumor (R): R0 - None   05/20/2021 Initial Diagnosis   Malignant neoplasm of central portion of right breast in female, estrogen receptor negative ([place])   08/07/2021 -  Chemotherapy   Patient is on Treatment Plan : BREAST DOCEtaxel + Trastuzumab + Pertuzumab (THP) q21d x 8 cycles / Trastuzumab + Pertuzumab q21d x 4 cycles        HISTORY OF PRESENTING ILLNESS: Ambulating independently.  Accompanied by her wife.   Allison Harper 51 y.o.  female history of ER/PR negative HER2/neu positive right breast cancer is here to establish care.  Patient was diagnosed with breast cancer December 2022.  Patient was initially recommended neoadjuvant chemotherapy.  However patient chose to proceed with mastectomy.  This is followed by adjuvant chemotherapy-TCH  plus P x6 cycles[finished July 2023].  S/p radiation with Dr. Donella Stade.  [facility] September 25.  Patient currently started on maintenance Traz-Perjeta- in [place] [on 9/15].    Patient complains of intermittent cramping in the legs right more than left.  Also complains of tingling and numbness in the extremities.  Otherwise patient tolerated treatments fairly well without any episodes of any fevers or hospitalizations.  Patient is in [place].  Wants to proceed with treatments locally.  Review of Systems  Constitutional:  Negative for chills, diaphoresis, fever, malaise/fatigue and weight loss.  HENT:  Negative for nosebleeds and sore throat.   Eyes:  Negative for double vision.  Respiratory:  Negative for cough, hemoptysis, sputum production, shortness of breath and wheezing.   Cardiovascular:  Negative for chest pain, palpitations, orthopnea and leg swelling.  Gastrointestinal:  Negative for abdominal pain, blood in stool, constipation, diarrhea, heartburn, melena, nausea and vomiting.  Genitourinary:  Negative for dysuria, frequency and urgency.  Musculoskeletal:  Positive for joint pain and myalgias. Negative for back pain.  Skin: Negative.  Negative for itching and rash.  Neurological:  Negative for dizziness, tingling, focal weakness, weakness and headaches.  Endo/Heme/Allergies:  Does not bruise/bleed easily.  Psychiatric/Behavioral:  Negative for depression. The patient is not nervous/anxious and does not have insomnia.     MEDICAL HISTORY:  Past Medical History:  Diagnosis Date   Breast cancer ([place])    Breast screening, unspecified 05/06/2010   Obesity 05/06/2010   Other benign neoplasm of connective and other soft tissue of trunk, unspecified 05/06/2010    SURGICAL HISTORY: Past Surgical History:  Procedure Laterality Date   CHOLECYSTECTOMY     CYST EXCISION  2008   from back   LIPOMA EXCISION Left 09/2010   flank   TONSILLECTOMY      SOCIAL HISTORY: Social History   Socioeconomic History   Marital status: Single    Spouse name: Not on file   Number of children: Not on file   Years of education: Not on file   Highest education  level: Not on file  Occupational History   Not on file  Tobacco Use   Smoking status: Never   Smokeless tobacco: Not on file  Substance and Sexual Activity   Alcohol use: Yes    Comment: ocassionally, 1-2 per month   Drug use: No   Sexual activity: Not on file  Other Topics Concern   Not on file  Social History Narrative   Not on file   Social Determinants of Health   Financial Resource Strain: Not on file  Food Insecurity: Not on file  Transportation Needs: Not on file  Physical Activity: Not on file  Stress: Not on file  Social Connections: Not on file  Intimate Partner Violence: Not on file    FAMILY HISTORY: Family History  Problem Relation Age of Onset   Breast cancer Neg Hx     ALLERGIES:  has no active allergies.  MEDICATIONS:  Current Outpatient Medications  Medication Sig Dispense Refill   lidocaine-prilocaine (EMLA) cream SMARTSIG:1 Topical Daily     metFORMIN (GLUCOPHAGE) 500 MG tablet Take by mouth.     Multiple Vitamins-Minerals (MULTIVITAMIN WITH MINERALS) tablet Take 1 tablet by mouth daily.     ondansetron (ZOFRAN) 8 MG tablet Take by mouth.     prochlorperazine (COMPAZINE) 10 MG tablet Take by mouth.     dexamethasone (DECADRON) 4 MG tablet Take 16 mg by mouth daily. (Patient not taking: Reported on 01/30/2022)  levofloxacin (LEVAQUIN) 500 MG tablet Take 500 mg by mouth daily. (Patient not taking: Reported on 01/30/2022)     LORazepam (ATIVAN) 0.5 MG tablet Take 0.5 mg by mouth daily as needed. (Patient not taking: Reported on 01/30/2022)     No current facility-administered medications for this visit.   Right mastectomy-well-healing.  Erythema noted from radiation.  No disclamation noted.  PHYSICAL EXAMINATION: ECOG PERFORMANCE STATUS: 1 - Symptomatic but completely ambulatory  Vitals:   01/30/22 1509  BP: 122/71  Pulse: 83  Resp: 20  Temp: 97.8 F (36.6 C)  SpO2: 100%   Filed Weights   01/30/22 1509  Weight: 236 lb 9.6 oz (107.3 kg)     Physical Exam Vitals and nursing note reviewed.  HENT:     Head: Normocephalic and atraumatic.     Mouth/Throat:     Pharynx: Oropharynx is clear.  Eyes:     Extraocular Movements: Extraocular movements intact.     Pupils: Pupils are equal, round, and reactive to light.  Cardiovascular:     Rate and Rhythm: Normal rate and regular rhythm.  Pulmonary:     Breath sounds: Normal breath sounds.  Abdominal:     Palpations: Abdomen is soft.  Musculoskeletal:        General: Normal range of motion.     Cervical back: Normal range of motion.  Skin:    General: Skin is warm.  Neurological:     General: No focal deficit present.     Mental Status: She is alert and oriented to person, place, and time.  Psychiatric:        Behavior: Behavior normal.        Judgment: Judgment normal.    LABORATORY DATA:  I have reviewed the data as listed Lab Results  Component Value Date   WBC 5.3 01/30/2022   HGB 11.7 (L) 01/30/2022   HCT 35.6 (L) 01/30/2022   MCV 88.6 01/30/2022   PLT 257 01/30/2022   Recent Labs    01/30/22 1615  NA 139  K 4.0  CL 108  CO2 25  GLUCOSE 114*  BUN 9  CREATININE 0.64  CALCIUM 9.1  GFRNONAA >60  PROT 7.7  ALBUMIN 4.1  AST 20  ALT 11  ALKPHOS 62  BILITOT 0.4    RADIOGRAPHIC STUDIES: I have personally reviewed the radiological images as listed and agreed with the findings in the report. No results found.   Malignant neoplasm of central portion of right breast in female, estrogen receptor negative ([place]) # 04/20/2021: Right breast invasive mammary carcinoma s/p mastectomy and SNL, pT1c(m) pN1a, ER low positive, PR negative and HER2 amplified-stage II.  Currently status post  adjuvant TCH-P x6 cycles [last 72/27]; Dr.Chrystal- [s/p  01/28/2022]. Phesgo cycle 1 day 1 today [01/18/2022]  #Discussed with proceed with Phesgo every 3 weeks for total of 11 more cycles. [Total of 1 year].  Tolerating well without any major side effects.  10/30/2021 at [facility], [place] is 55-60%. We discussed continuing echo cardiograms per protocol every 3 months while on Phesgo. Will order Echo at next visit.   # 07/13/2021: Status post FNA left lung nodule showing neoplastic cells consistent with low-grade neuroendocrine tumor. 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 Awaiting  PET/CT scan per Dr. Kern Alberta.    # Neuropathy/myalgias: Grade 1-2.  She also has myalgias specifically leg cramps.  Clinically less likely DVT.  Discussed with the patient regarding ultrasound Dopplers ;  declined.  Continue magnesium; and also stretching.  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  Recommend continued Aquaphor.  If still not better recommend topical hydrocortisone.  # PCOS/ steroids induced- DM- on metformin-we will monitor patient closely in the near future.    # DISPOSITION:  #  today labs- cbc/cmp; magnesium;   # Oct 6th- No labs-Phegso -[please talk to pt before scheduling];  # Oct 27th, MD; No labs; Phegsego- Dr.B  Above plan of care was discussed with patient/family in detail.  My contact information was given to the patient/family.     Cammie Sickle, MD 01/31/2022 7:50 AM

## 2022-01-30 NOTE — Assessment & Plan Note (Addendum)
#  04/20/2021: Right breast invasive mammary carcinoma s/p mastectomy and SNL, pT1c(m) pN1a, ER low positive, PR negative and HER2 amplified.  07/13/2021: Status post FNA left lung nodule showing neoplastic cells consistent with low-grade neuroendocrine tumor.  Ms. Berkheimer has now completed adjuvant TCH-P x6 cycles [last 72/27]; Dr.Chrystal- [s/p  01/28/2022].   Phesgo cycle 1 day 1 today [01/18/2022]  . I reviewed the risks, side effects and benefits again today. We discussed that the pertuzumab can specifically diarrhea and have asked her to let us know if she has issues or uncontrolled diarrhea abdominal cramping or other symptoms. Her echocardiogram is up-to-date from 10/30/2021 at [facility], [place] is 55-60%. We discussed continuing echo cardiograms per protocol every 3 months while on Phesgo.  She continues to have the left upper chest Port-A-Cath which was placed through a Psychiatrist. I discussed keeping the Port-A-Cath for at least 1-2 Phesgo injections in case she were to develop a reaction to the subcu Herceptin/Perjeta in which case we would need her Port-A-Cath for infusion. Otherwise if she is tolerating well we will have her surgeon remove the Port-A-Cath.  2. Neuropathy/myalgias: She has grade 1 upper extremity neuropathy and grade 2 lower extremity neuropathy. She also has myalgias specifically leg cramps that have been more acute lately. I asked her to try taking 4-800 mg of magnesium at bedtime and also stretching and/or using a foam roller to help with the myalgias. She is also going to start walking on her treadmill which will be excellent.  3. Radiation oncology: She is finishing up adjuvant radiation to the right axilla per Dr. Dr[facility]. She has some intermittent fatigue and will be completing treatment on 01/28/2022.  4. 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 We did not specifically  discuss this topic today. She was scheduled for a PET/CT scan per Dr. Kern Alberta and did not need to cancel the appointment.  5. Wellness: She is adopting a healthy lifestyle including exercise I think is doing a good job. She is pleased that she will be going back to work soon.  Ms. Herrada and opportunity ask questions and have answered today and is agreement with her plan of care.  RTC in 3 weeks to see Dr. Kern Alberta.  # PN-1-2- Leg cramping-  G-1-2.   # Right LE cramping recommend   # PCOS/ steroids induced- DM- on metformin..   # DISPOSITION:  #  today labs- cbc/cmp; magnesium;   # Oct 6th- No labs-Phegso -[please talk to pt before scheduling];  # Oct 27th, MD; No labs; Phegsego- Dr.B

## 2022-01-31 ENCOUNTER — Other Ambulatory Visit: Payer: Self-pay | Admitting: Internal Medicine

## 2022-01-31 ENCOUNTER — Encounter: Payer: Self-pay | Admitting: Internal Medicine

## 2022-01-31 ENCOUNTER — Other Ambulatory Visit: Payer: Self-pay

## 2022-01-31 DIAGNOSIS — Z171 Estrogen receptor negative status [ER-]: Secondary | ICD-10-CM

## 2022-01-31 NOTE — Progress Notes (Signed)
Patient finished 6 cycles of TCHP and 1 initial dose of Phesgo on 9/15 with [facility] in [place].  MD wants 11 more cycles of Phesgo to start 10/6

## 2022-02-08 ENCOUNTER — Ambulatory Visit: Payer: 59 | Admitting: Internal Medicine

## 2022-02-08 ENCOUNTER — Inpatient Hospital Stay: Payer: 59 | Attending: Internal Medicine

## 2022-02-08 VITALS — BP 146/76 | HR 90 | Temp 98.2°F | Resp 18 | Wt 236.2 lb

## 2022-02-08 DIAGNOSIS — C50111 Malignant neoplasm of central portion of right female breast: Secondary | ICD-10-CM | POA: Insufficient documentation

## 2022-02-08 DIAGNOSIS — Z5112 Encounter for antineoplastic immunotherapy: Secondary | ICD-10-CM | POA: Diagnosis present

## 2022-02-08 DIAGNOSIS — Z171 Estrogen receptor negative status [ER-]: Secondary | ICD-10-CM | POA: Insufficient documentation

## 2022-02-08 DIAGNOSIS — C7A8 Other malignant neuroendocrine tumors: Secondary | ICD-10-CM | POA: Insufficient documentation

## 2022-02-08 MED ORDER — DIPHENHYDRAMINE HCL 25 MG PO CAPS
50.0000 mg | ORAL_CAPSULE | Freq: Once | ORAL | Status: AC
  Administered 2022-02-08: 50 mg via ORAL
  Filled 2022-02-08: qty 2

## 2022-02-08 MED ORDER — SLOW MAGNESIUM/CALCIUM 70-117 MG PO TBEC: 1.0000 | DELAYED_RELEASE_TABLET | Freq: Two times a day (BID) | ORAL | 6 refills | Status: DC

## 2022-02-08 MED ORDER — ACETAMINOPHEN 325 MG PO TABS
650.0000 mg | ORAL_TABLET | Freq: Once | ORAL | Status: AC
  Administered 2022-02-08: 650 mg via ORAL
  Filled 2022-02-08: qty 2

## 2022-02-08 MED ORDER — PERTUZ-TRASTUZ-HYALURON-ZZXF 60-60-2000 MG-MG-U/ML CHEMO ~~LOC~~ SOLN
10.0000 mL | Freq: Once | SUBCUTANEOUS | Status: AC
  Administered 2022-02-08: 10 mL via SUBCUTANEOUS
  Filled 2022-02-08: qty 10

## 2022-02-08 NOTE — Addendum Note (Signed)
Addended by: Charlaine Dalton R on: 02/08/2022 01:23 PM   Modules accepted: Orders

## 2022-02-08 NOTE — Patient Instructions (Signed)
[facility]  Discharge Instructions: Thank you for choosing [facility] to provide your oncology and hematology care.  If you have a lab appointment with the Cancer Center, please go directly to the Cancer Center and check in at the registration area.  Wear comfortable clothing and clothing appropriate for easy access to any Portacath or PICC line.   We strive to give you quality time with your provider. You may need to reschedule your appointment if you arrive late (15 or more minutes).  Arriving late affects you and other patients whose appointments are after yours.  Also, if you miss three or more appointments without notifying the office, you may be dismissed from the clinic at the provider's discretion.      For prescription refill requests, have your pharmacy contact our office and allow 72 hours for refills to be completed.    Today you received the following chemotherapy and/or immunotherapy agents Phesgo      To help prevent nausea and vomiting after your treatment, we encourage you to take your nausea medication as directed.  BELOW ARE SYMPTOMS THAT SHOULD BE REPORTED IMMEDIATELY: *FEVER GREATER THAN 100.4 F (38 C) OR HIGHER *CHILLS OR SWEATING *NAUSEA AND VOMITING THAT IS NOT CONTROLLED WITH YOUR NAUSEA MEDICATION *UNUSUAL SHORTNESS OF BREATH *UNUSUAL BRUISING OR BLEEDING *URINARY PROBLEMS (pain or burning when urinating, or frequent urination) *BOWEL PROBLEMS (unusual diarrhea, constipation, pain near the anus) TENDERNESS IN MOUTH AND THROAT WITH OR WITHOUT PRESENCE OF ULCERS (sore throat, sores in mouth, or a toothache) UNUSUAL RASH, SWELLING OR PAIN  UNUSUAL VAGINAL DISCHARGE OR ITCHING   Items with * indicate a potential emergency and should be followed up as soon as possible or go to the Emergency Department if any problems should occur.  Please show the CHEMOTHERAPY ALERT CARD or IMMUNOTHERAPY ALERT CARD at check-in to the  Emergency Department and triage nurse.  Should you have questions after your visit or need to cancel or reschedule your appointment, please contact [facility]  [phone] and follow the prompts.  Office hours are 8:00 a.m. to 4:30 p.m. Monday - Friday. Please note that voicemails left after 4:00 p.m. may not be returned until the following business day.  We are closed weekends and major holidays. You have access to a nurse at all times for urgent questions. Please call the main number to the clinic [phone] and follow the prompts.  For any non-urgent questions, you may also contact your provider using MyChart. We now offer e-Visits for anyone 18 and older to request care online for non-urgent symptoms. For details visit [email or web address].   Also download the MyChart app! Go to the app store, search "MyChart", open the app, select [facility], and log in with your MyChart username and password.  Masks are optional in the cancer centers. If you would like for your care team to wear a mask while they are taking care of you, please let them know. For doctor visits, patients may have with them one support person who is at least 52 years old. At this time, visitors are not allowed in the infusion area.   

## 2022-02-22 ENCOUNTER — Other Ambulatory Visit: Payer: Self-pay

## 2022-03-01 ENCOUNTER — Inpatient Hospital Stay (HOSPITAL_BASED_OUTPATIENT_CLINIC_OR_DEPARTMENT_OTHER): Payer: 59 | Admitting: Internal Medicine

## 2022-03-01 ENCOUNTER — Other Ambulatory Visit: Payer: Self-pay

## 2022-03-01 ENCOUNTER — Inpatient Hospital Stay: Payer: 59

## 2022-03-01 ENCOUNTER — Encounter: Payer: Self-pay | Admitting: Internal Medicine

## 2022-03-01 VITALS — BP 141/61 | HR 77 | Resp 16

## 2022-03-01 VITALS — BP 122/54 | HR 73 | Temp 98.4°F | Resp 18 | Wt 239.0 lb

## 2022-03-01 DIAGNOSIS — Z9221 Personal history of antineoplastic chemotherapy: Secondary | ICD-10-CM | POA: Diagnosis not present

## 2022-03-01 DIAGNOSIS — Z171 Estrogen receptor negative status [ER-]: Secondary | ICD-10-CM | POA: Diagnosis not present

## 2022-03-01 DIAGNOSIS — Z5112 Encounter for antineoplastic immunotherapy: Secondary | ICD-10-CM | POA: Diagnosis not present

## 2022-03-01 DIAGNOSIS — C50111 Malignant neoplasm of central portion of right female breast: Secondary | ICD-10-CM

## 2022-03-01 LAB — COMPREHENSIVE METABOLIC PANEL
ALT: 13 U/L (ref 0–44)
AST: 18 U/L (ref 15–41)
Albumin: 3.8 g/dL (ref 3.5–5.0)
Alkaline Phosphatase: 57 U/L (ref 38–126)
Anion gap: 7 (ref 5–15)
BUN: 11 mg/dL (ref 6–20)
CO2: 25 mmol/L (ref 22–32)
Calcium: 9 mg/dL (ref 8.9–10.3)
Chloride: 107 mmol/L (ref 98–111)
Creatinine, Ser: 0.71 mg/dL (ref 0.44–1.00)
GFR, Estimated: >60 mL/min (ref >60)
Glucose, Bld: 117 mg/dL — ABNORMAL HIGH (ref 70–99)
Potassium: 3.8 mmol/L (ref 3.5–5.1)
Sodium: 139 mmol/L (ref 135–145)
Total Bilirubin: 0.5 mg/dL (ref 0.3–1.2)
Total Protein: 7 g/dL (ref 6.5–8.1)

## 2022-03-01 LAB — CBC WITH DIFFERENTIAL/PLATELET
Abs Immature Granulocytes: 0.01 10*3/uL (ref 0.00–0.07)
Basophils Absolute: 0.1 10*3/uL (ref 0.0–0.1)
Basophils Relative: 1 %
Eosinophils Absolute: 0.3 10*3/uL (ref 0.0–0.5)
Eosinophils Relative: 6 %
HCT: 36.1 % (ref 36.0–46.0)
Hemoglobin: 11.8 g/dL — ABNORMAL LOW (ref 12.0–15.0)
Immature Granulocytes: 0 %
Lymphocytes Relative: 32 %
Lymphs Abs: 1.6 10*3/uL (ref 0.7–4.0)
MCH: 28.6 pg (ref 26.0–34.0)
MCHC: 32.7 g/dL (ref 30.0–36.0)
MCV: 87.6 fL (ref 80.0–100.0)
Monocytes Absolute: 0.3 10*3/uL (ref 0.1–1.0)
Monocytes Relative: 7 %
Neutro Abs: 2.8 10*3/uL (ref 1.7–7.7)
Neutrophils Relative %: 54 %
Platelets: 224 10*3/uL (ref 150–400)
RBC: 4.12 MIL/uL (ref 3.87–5.11)
RDW: 13.2 % (ref 11.5–15.5)
WBC: 5.1 10*3/uL (ref 4.0–10.5)
nRBC: 0 % (ref 0.0–0.2)

## 2022-03-01 LAB — MAGNESIUM: Magnesium: 1.7 mg/dL (ref 1.7–2.4)

## 2022-03-01 MED ORDER — HEPARIN SOD (PORK) LOCK FLUSH 100 UNIT/ML IV SOLN
500.0000 [IU] | Freq: Once | INTRAVENOUS | Status: AC | PRN
  Administered 2022-03-01: 500 [IU]
  Filled 2022-03-01: qty 5

## 2022-03-01 MED ORDER — HEPARIN SOD (PORK) LOCK FLUSH 100 UNIT/ML IV SOLN
500.0000 [IU] | Freq: Once | INTRAVENOUS | Status: DC
  Filled 2022-03-01: qty 5

## 2022-03-01 MED ORDER — PERTUZ-TRASTUZ-HYALURON-ZZXF 60-60-2000 MG-MG-U/ML CHEMO ~~LOC~~ SOLN
10.0000 mL | Freq: Once | SUBCUTANEOUS | Status: AC
  Administered 2022-03-01: 10 mL via SUBCUTANEOUS
  Filled 2022-03-01: qty 10

## 2022-03-01 MED ORDER — HEPARIN SOD (PORK) LOCK FLUSH 100 UNIT/ML IV SOLN
INTRAVENOUS | Status: AC
  Filled 2022-03-01: qty 5

## 2022-03-01 MED ORDER — DIPHENHYDRAMINE HCL 25 MG PO CAPS
50.0000 mg | ORAL_CAPSULE | Freq: Once | ORAL | Status: AC
  Administered 2022-03-01: 50 mg via ORAL
  Filled 2022-03-01: qty 2

## 2022-03-01 MED ORDER — ACETAMINOPHEN 325 MG PO TABS
650.0000 mg | ORAL_TABLET | Freq: Once | ORAL | Status: AC
  Administered 2022-03-01: 650 mg via ORAL
  Filled 2022-03-01: qty 2

## 2022-03-01 MED ORDER — SODIUM CHLORIDE 0.9% FLUSH
10.0000 mL | INTRAVENOUS | Status: DC | PRN
  Filled 2022-03-01: qty 10

## 2022-03-01 MED ORDER — SODIUM CHLORIDE 0.9% FLUSH
10.0000 mL | Freq: Once | INTRAVENOUS | Status: AC
  Administered 2022-03-01: 10 mL via INTRAVENOUS
  Filled 2022-03-01: qty 10

## 2022-03-01 NOTE — Progress Notes (Signed)
Patient here for oncology follow-up appointment, expresses no complaints or concerns at this time.    

## 2022-03-01 NOTE — Progress Notes (Signed)
[facility] CONSULT NOTE  Patient Care Team: Pcp, No as PCP - General Byrnett, Forest Gleason, MD as Consulting Physician (General Surgery)  CHIEF COMPLAINTS/PURPOSE OF CONSULTATION: breast cancer   Oncology History Overview Note  Final Diagnosis    1.  Lymph node, right axilla, excision:   -One lymph node, negative for carcinoma (0/1).   2. Sentinel lymph node, right axilla #1, excision:   -One lymph node, negative for carcinoma; multiple levels examined (0/1).   3. Sentinel lymph node, right axilla #2, excision:   -One lymph node, negative for carcinoma; multiple levels examined (0/1).   4. Sentinel lymph node, right axilla #3, excision:   -Two lymph nodes, negative for carcinoma; multiple levels examined (0/2).   5.  Lymph node, right axilla, excision:   -One lymph node, positive for carcinoma (1/1). -Metastatic focus measures greater than 3 mm. -No extranodal extension is seen.   6.  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 -Invasive focus measures 12 mm microscopically (measured on slide 6E). 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 -Margins are negative for in situ and invasive carcinoma; closest is posterior at 7 mm and greater than 10 mm respectively. -Usual ductal hyperplasia, cystic apocrine metaplasia, fibroadenomatoid changes and biopsy site changes present. -pT1c ([id number] M-not applicable; AJCC stage IIA. -Please see synoptic report below.    # 04/20/2021: [Dr.Rao- ]Right breast invasive mammary carcinoma; declined neoadjuvant chemotherapy.  S/p mastectomy and SNL, pT1c(m) pN1a, ER low positive, PR negative and HER2 amplified-stage II.  Currently status post   adjuvant TCH-P x6 cycles [last 72/27]; Dr.Chrystal- [s/p  01/28/2022]. Phesgo cycle 1 day 1   [01/18/2022]  # OCT 6th, 2023- Phesgo every 3 weeks for total of 11 more cycles. [Total of 1 year].   # 07/13/2021: Status post FNA left lung nodule showing neoplastic cells consistent with low-grade neuroendocrine tumor [[facility]]. 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 Awaiting  PET/CT scan per Dr. Kern Alberta.   # PCOS/DM-sec to steroids- metformin   Malignant neoplasm of central portion of right breast in female, estrogen receptor negative ([place])  05/11/2021 Cancer Staging   Staging form: Breast, AJCC 8th Edition - Pathologic stage from 05/11/2021: Stage IIA (pT1c, pN1a(sn), cM0, G3, ER-, PR-, HER2+) - Signed by Sindy Guadeloupe, MD on 05/20/2021 Stage prefix: Initial diagnosis Method of lymph node assessment: Sentinel lymph node biopsy Multigene prognostic tests performed: None Histologic grading system: 3 grade system Residual tumor (R): R0 - None   05/20/2021 Initial Diagnosis   Malignant neoplasm of central portion of right breast in female, estrogen receptor negative ([place])   08/07/2021 -  Chemotherapy   Patient is on Treatment Plan : BREAST DOCEtaxel + Trastuzumab + Pertuzumab (THP) q21d x 8 cycles / Trastuzumab + Pertuzumab q21d x 4 cycles        HISTORY OF PRESENTING ILLNESS: Ambulating independently.  Accompanied by her wife.   Allison Harper 51 y.o.  female history of ER/PR negative HER2/neu positive  Stage II right breast cancer status post mastectomy currently on adjuvant Traz-Perjeta/Phesgo is here for follow-up.  Patient here for oncology follow-up appointment, expresses no complaints or concerns at this time.     Notes to have  improvement of her leg cramps.  Otherwise denies any leg swelling.  She continues with magnesium supplementation.  Review of Systems  Constitutional:  Negative for chills, diaphoresis, fever,  malaise/fatigue and weight loss.  HENT:  Negative for nosebleeds and sore throat.   Eyes:  Negative for double vision.  Respiratory:  Negative for cough, hemoptysis, sputum production, shortness of breath and wheezing.   Cardiovascular:  Negative for chest pain, palpitations, orthopnea and leg swelling.  Gastrointestinal:  Negative for abdominal pain, blood in stool, constipation, diarrhea, heartburn, melena, nausea and vomiting.  Genitourinary:  Negative for dysuria, frequency and urgency.  Musculoskeletal:  Positive for joint pain and myalgias. Negative for back pain.  Skin: Negative.  Negative for itching and rash.  Neurological:  Negative for dizziness, tingling, focal weakness, weakness and headaches.  Endo/Heme/Allergies:  Does not bruise/bleed easily.  Psychiatric/Behavioral:  Negative for depression. The patient is not nervous/anxious and does not have insomnia.     MEDICAL HISTORY:  Past Medical History:  Diagnosis Date   Breast cancer ([place])    Breast screening, unspecified 05/06/2010   Obesity 05/06/2010   Other benign neoplasm of connective and other soft tissue of trunk, unspecified 05/06/2010    SURGICAL HISTORY: Past Surgical History:  Procedure Laterality Date   CHOLECYSTECTOMY     CYST EXCISION  2008   from back   LIPOMA EXCISION Left 09/2010   flank   TONSILLECTOMY      SOCIAL HISTORY: Social History   Socioeconomic History   Marital status: Single    Spouse name: Not on file   Number of children: Not on file   Years of education: Not on file   Highest education level: Not on file  Occupational History   Not on file  Tobacco Use   Smoking status: Never   Smokeless tobacco: Not on file  Substance and Sexual Activity   Alcohol use: Yes    Comment: ocassionally, 1-2 per month   Drug use: No   Sexual activity: Not on file  Other Topics Concern   Not on file  Social History Narrative   Not on file   Social Determinants of Health   Financial  Resource Strain: Not on file  Food Insecurity: Not on file  Transportation Needs: Not on file  Physical Activity: Not on file  Stress: Not on file  Social Connections: Not on file  Intimate Partner Violence: Not on file    FAMILY HISTORY: Family History  Problem Relation Age of Onset   Breast cancer Neg Hx     ALLERGIES:  has no active allergies.  MEDICATIONS:  Current Outpatient Medications  Medication Sig Dispense Refill   lidocaine-prilocaine (EMLA) cream SMARTSIG:1 Topical Daily     Magnesium Cl-Calcium Carbonate (SLOW MAGNESIUM/CALCIUM) 70-117 MG TBEC Take 1 tablet by mouth 2 (two) times daily. 60 tablet 6   metFORMIN (GLUCOPHAGE) 500 MG tablet Take by mouth.     Multiple Vitamins-Minerals (MULTIVITAMIN WITH MINERALS) tablet Take 1 tablet by mouth daily.     ondansetron (ZOFRAN) 8 MG tablet Take by mouth.     prochlorperazine (COMPAZINE) 10 MG tablet Take by mouth.     allopurinol (ZYLOPRIM) 100 MG tablet Take 100 mg by mouth daily.     dexamethasone (DECADRON) 4 MG tablet Take 16 mg by mouth daily. (Patient not taking: Reported on 01/30/2022)     levofloxacin (LEVAQUIN) 500 MG tablet Take 500 mg by mouth daily. (Patient not taking: Reported on 01/30/2022)     LORazepam (  ATIVAN) 0.5 MG tablet Take 0.5 mg by mouth daily as needed. (Patient not taking: Reported on 01/30/2022)     No current facility-administered medications for this visit.   Facility-Administered Medications Ordered in Other Visits  Medication Dose Route Frequency Provider Last Rate Last Admin   heparin lock flush 100 unit/mL  500 Units Intravenous Once Sindy Guadeloupe, MD       sodium chloride flush (NS) 0.9 % injection 10 mL  10 mL Intravenous Once Sindy Guadeloupe, MD       Right mastectomy-well-healing.  Erythema noted from radiation.  No disclamation noted.  PHYSICAL EXAMINATION: ECOG PERFORMANCE STATUS: 1 - Symptomatic but completely ambulatory  Vitals:   03/01/22 0844  BP: (!) 122/54  Pulse: 73   Resp: 18  Temp: 98.4 F (36.9 C)  SpO2: 97%   Filed Weights   03/01/22 0844  Weight: 239 lb (108.4 kg)    Physical Exam Vitals and nursing note reviewed.  HENT:     Head: Normocephalic and atraumatic.     Mouth/Throat:     Pharynx: Oropharynx is clear.  Eyes:     Extraocular Movements: Extraocular movements intact.     Pupils: Pupils are equal, round, and reactive to light.  Cardiovascular:     Rate and Rhythm: Normal rate and regular rhythm.  Pulmonary:     Breath sounds: Normal breath sounds.  Abdominal:     Palpations: Abdomen is soft.  Musculoskeletal:        General: Normal range of motion.     Cervical back: Normal range of motion.  Skin:    General: Skin is warm.  Neurological:     General: No focal deficit present.     Mental Status: She is alert and oriented to person, place, and time.  Psychiatric:        Behavior: Behavior normal.        Judgment: Judgment normal.    LABORATORY DATA:  I have reviewed the data as listed Lab Results  Component Value Date   WBC 5.1 03/01/2022   HGB 11.8 (L) 03/01/2022   HCT 36.1 03/01/2022   MCV 87.6 03/01/2022   PLT 224 03/01/2022   Recent Labs    01/30/22 1615 03/01/22 0828  NA 139 139  K 4.0 3.8  CL 108 107  CO2 25 25  GLUCOSE 114* 117*  BUN 9 11  CREATININE 0.64 0.71  CALCIUM 9.1 9.0  GFRNONAA >60 >60  PROT 7.7 7.0  ALBUMIN 4.1 3.8  AST 20 18  ALT 11 13  ALKPHOS 62 57  BILITOT 0.4 0.5    RADIOGRAPHIC STUDIES: I have personally reviewed the radiological images as listed and agreed with the findings in the report. No results found.   Malignant neoplasm of central portion of right breast in female, estrogen receptor negative ([place]) # 04/20/2021: Right breast invasive mammary carcinoma s/p mastectomy and SNL, pT1c(m) pN1a, ER low positive, PR negative and HER2 amplified-stage II.  Currently status post  adjuvant TCH-P x6 cycles [last 72/27]; Dr.Chrystal- [s/p  01/28/2022]. Phesgo cycle 1 day 1 today  [01/18/2022]  # Discussed with proceed with Phesgo every 3 weeks. [Total of 1 year- until May 2023].  Tolerating well without any major side effects.  10/30/2021 at [facility], [place] is 55-60%. We discussed continuing echo cardiograms per protocol every 3 months while on Phesgo.  Ordered 2D echo today.  # 07/13/2021: Status post FNA left lung nodule showing neoplastic cells consistent with low-grade neuroendocrine  tumor. 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   PET APRIL 2023- left lower lobe pulmonary nodules measuring up to 13 mm.  Recommend follow-up with pulmonary-regarding follow-up imaging.  # Fasting BG- 117; recommend dietary discretion.    # Neuropathy/myalgias: Grade 1  Improved on magnesium; exercising-   #Radiation dermatitis: Grade 1-2. Resolved.   # PCOS/ steroids induced- DM- on 1/2 metformin-we will monitor patient closely in the near future.   # DISPOSITION:  # 2 D echo.  #  ADD- magnesium to labs.  # today Phesgo # in 3 weeks- labs- cbc/cmp;mag; -Phesgo;    # in 6 weeks-NP;  labs- cbc/cmp;mag;- Phesgo  Dr.B   Above plan of care was discussed with patient/family in detail.  My contact information was given to the patient/family.     Cammie Sickle, MD 03/01/2022 9:21 AM

## 2022-03-01 NOTE — Patient Instructions (Signed)
[facility]  Discharge Instructions: Thank you for choosing [facility] to provide your oncology and hematology care.  If you have a lab appointment with the Cancer Center, please go directly to the Cancer Center and check in at the registration area.  Wear comfortable clothing and clothing appropriate for easy access to any Portacath or PICC line.   We strive to give you quality time with your provider. You may need to reschedule your appointment if you arrive late (15 or more minutes).  Arriving late affects you and other patients whose appointments are after yours.  Also, if you miss three or more appointments without notifying the office, you may be dismissed from the clinic at the provider's discretion.      For prescription refill requests, have your pharmacy contact our office and allow 72 hours for refills to be completed.    Today you received the following chemotherapy and/or immunotherapy agents Phesgo      To help prevent nausea and vomiting after your treatment, we encourage you to take your nausea medication as directed.  BELOW ARE SYMPTOMS THAT SHOULD BE REPORTED IMMEDIATELY: *FEVER GREATER THAN 100.4 F (38 C) OR HIGHER *CHILLS OR SWEATING *NAUSEA AND VOMITING THAT IS NOT CONTROLLED WITH YOUR NAUSEA MEDICATION *UNUSUAL SHORTNESS OF BREATH *UNUSUAL BRUISING OR BLEEDING *URINARY PROBLEMS (pain or burning when urinating, or frequent urination) *BOWEL PROBLEMS (unusual diarrhea, constipation, pain near the anus) TENDERNESS IN MOUTH AND THROAT WITH OR WITHOUT PRESENCE OF ULCERS (sore throat, sores in mouth, or a toothache) UNUSUAL RASH, SWELLING OR PAIN  UNUSUAL VAGINAL DISCHARGE OR ITCHING   Items with * indicate a potential emergency and should be followed up as soon as possible or go to the Emergency Department if any problems should occur.  Please show the CHEMOTHERAPY ALERT CARD or IMMUNOTHERAPY ALERT CARD at check-in to the  Emergency Department and triage nurse.  Should you have questions after your visit or need to cancel or reschedule your appointment, please contact [facility]  [phone] and follow the prompts.  Office hours are 8:00 a.m. to 4:30 p.m. Monday - Friday. Please note that voicemails left after 4:00 p.m. may not be returned until the following business day.  We are closed weekends and major holidays. You have access to a nurse at all times for urgent questions. Please call the main number to the clinic [phone] and follow the prompts.  For any non-urgent questions, you may also contact your provider using MyChart. We now offer e-Visits for anyone 18 and older to request care online for non-urgent symptoms. For details visit [email or web address].   Also download the MyChart app! Go to the app store, search "MyChart", open the app, select [facility], and log in with your MyChart username and password.  Masks are optional in the cancer centers. If you would like for your care team to wear a mask while they are taking care of you, please let them know. For doctor visits, patients may have with them one support person who is at least 52 years old. At this time, visitors are not allowed in the infusion area.   

## 2022-03-01 NOTE — Assessment & Plan Note (Addendum)
#  04/20/2021: Right breast invasive mammary carcinoma s/p mastectomy and SNL, pT1c(m) pN1a, ER low positive, PR negative and HER2 amplified-stage II.  Currently status post  adjuvant TCH-P x6 cycles [last 72/27]; Dr.Chrystal- [s/p  01/28/2022]. Phesgo cycle 1-9/15/2023 [[place]]  # Proceed with Phesgo every 3 weeks. [Total of 1 year- until May 2023].  Tolerating well without any major side effects.  10/30/2021 at [facility], [place] is 55-60%. We discussed continuing echo cardiograms per protocol every 3 months while on Phesgo.  Ordered 2D echo today.  # 07/13/2021: Status post FNA left lung nodule showing neoplastic cells consistent with low-grade neuroendocrine tumor. 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   PET APRIL 2023- left lower lobe pulmonary nodules measuring up to 13 mm.  Recommend follow-up with pulmonary-regarding follow-up imaging.  # Fasting BG- 117; recommend dietary discretion.    # Neuropathy/myalgias: Grade 1  Improved on magnesium; exercising-   #Radiation dermatitis: Grade 1-2. Resolved.   # PCOS/ steroids induced- DM- on 1/2 metformin-we will monitor patient closely in the near future.   # DISPOSITION:  # 2 D echo.  #  ADD- magnesium to labs.  # today Phesgo # in 3 weeks- labs- cbc/cmp;mag; -Phesgo;    # in 6 weeks-NP;  labs- cbc/cmp;mag;- Phesgo  Dr.B

## 2022-03-02 ENCOUNTER — Other Ambulatory Visit: Payer: Self-pay

## 2022-03-03 ENCOUNTER — Other Ambulatory Visit: Payer: Self-pay

## 2022-03-06 ENCOUNTER — Encounter: Payer: Self-pay | Admitting: Internal Medicine

## 2022-03-06 ENCOUNTER — Ambulatory Visit: Payer: 59 | Admitting: Radiation Oncology

## 2022-03-07 ENCOUNTER — Other Ambulatory Visit: Payer: Self-pay | Admitting: Internal Medicine

## 2022-03-07 ENCOUNTER — Ambulatory Visit
Admission: RE | Admit: 2022-03-07 | Discharge: 2022-03-07 | Disposition: A | Discharge status: Home / Self Care | Payer: 59 | Source: Ambulatory Visit | Attending: Internal Medicine | Admitting: Internal Medicine

## 2022-03-07 DIAGNOSIS — I080 Rheumatic disorders of both mitral and aortic valves: Secondary | ICD-10-CM | POA: Diagnosis not present

## 2022-03-07 DIAGNOSIS — C50111 Malignant neoplasm of central portion of right female breast: Secondary | ICD-10-CM | POA: Insufficient documentation

## 2022-03-07 DIAGNOSIS — Z0189 Encounter for other specified special examinations: Secondary | ICD-10-CM

## 2022-03-07 DIAGNOSIS — Z171 Estrogen receptor negative status [ER-]: Secondary | ICD-10-CM

## 2022-03-07 DIAGNOSIS — Z9221 Personal history of antineoplastic chemotherapy: Secondary | ICD-10-CM | POA: Diagnosis not present

## 2022-03-07 LAB — ECHOCARDIOGRAM COMPLETE
AR max vel: 3.4 cm2
AV Area VTI: 3.24 cm2
AV Area mean vel: 3.25 cm2
AV Mean grad: 6 mmHg
AV Peak grad: 11.6 mmHg
Ao pk vel: 1.7 m/s
Area-P 1/2: 3.68 cm2
P 1/2 time: 568 msec
S' Lateral: 2.9 cm

## 2022-03-07 NOTE — Progress Notes (Signed)
*  PRELIMINARY RESULTS* Echocardiogram 2D Echocardiogram has been performed.  Allison Harper 03/07/2022, 9:57 AM

## 2022-03-09 ENCOUNTER — Other Ambulatory Visit: Payer: Self-pay

## 2022-03-12 ENCOUNTER — Other Ambulatory Visit: Payer: Self-pay

## 2022-03-14 IMAGING — US US BREAST*R* LIMITED INC AXILLA
2 series · 4 of 4 positions shown · non-contrast
Comparison: Previous exam(s).

CLINICAL DATA: Patient recalled from screening for bilateral
calcifications.

EXAM:
DIGITAL DIAGNOSTIC BILATERAL MAMMOGRAM WITH CAD AND TOMO
ULTRASOUND BILATERAL BREAST

[Series 1: us breast*right* limited inc axilla · 0.09mm/px · 2 of 2 slices shown (1 of 2)]
[im 1/2]
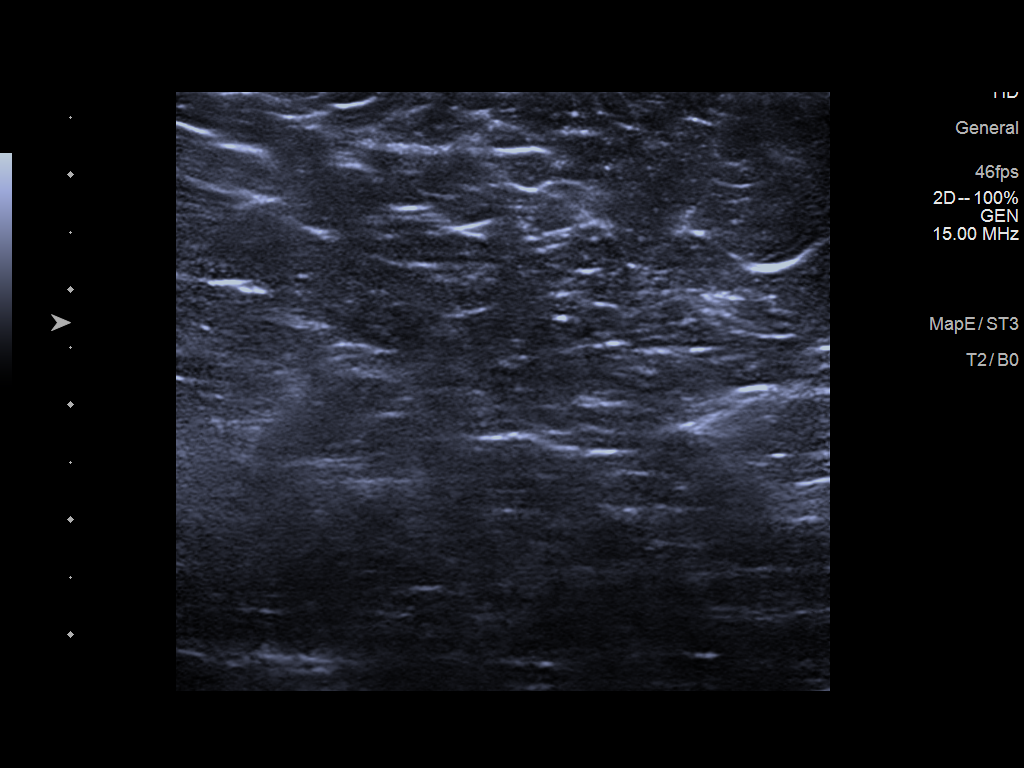
[im 2/2]
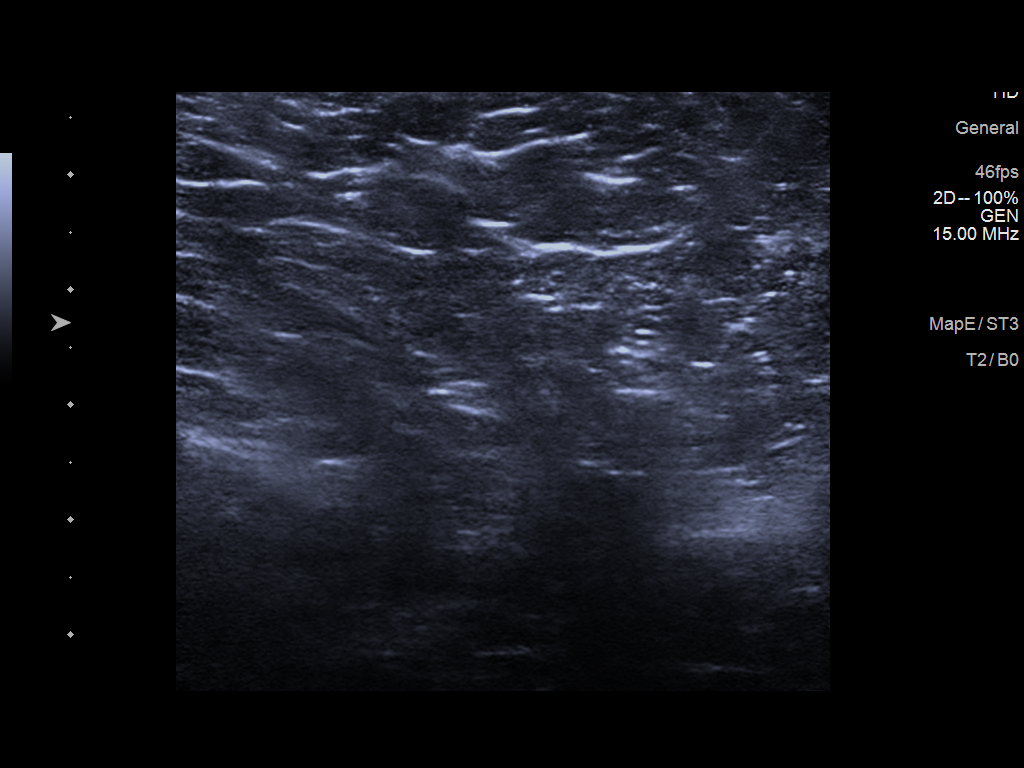

[Series 2: us breast*right* limited inc axilla · 0.09mm/px · 2 of 2 slices shown (2 of 2)]
[im 1/2]
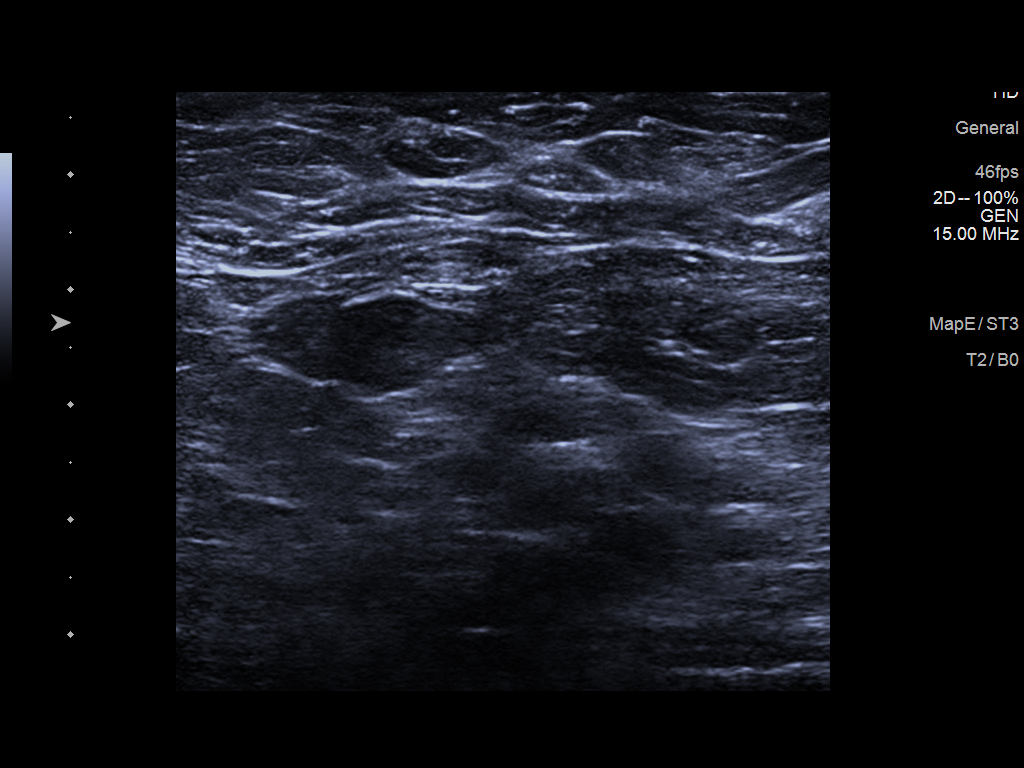
[im 2/2]
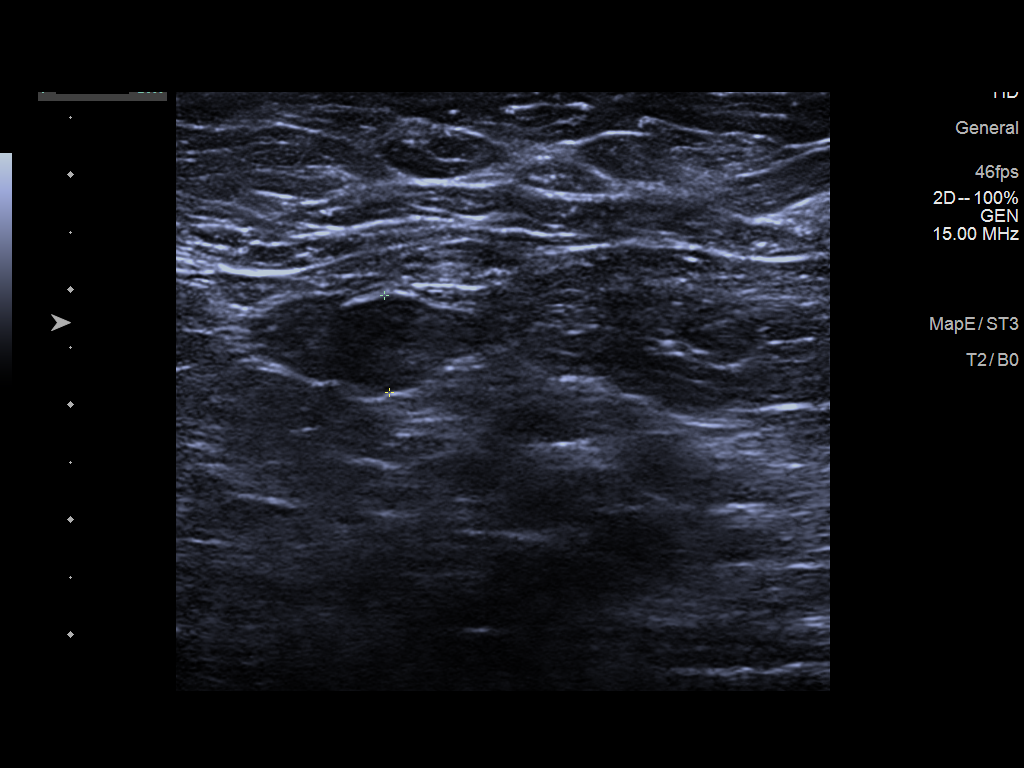

[4 of 4 positions shown; findings below may reference images not displayed]

ACR Breast Density Category c: The breast tissue is heterogeneously
dense, which may obscure small masses.
FINDINGS: Magnification CC and true lateral views of the right and left breast
were obtained.

There are pleomorphic calcifications demonstrated through the upper
outer and lower outer right breast measuring up to 12 x 10 cm in
dimension.

Within the upper-outer left breast there are two groups of punctate
calcifications. One of the groups is within the middle depth and the
additional group is within the posterior depth.

Mammographic images were processed with CAD.

Targeted ultrasound is performed, showing a mildly thickened right
axillary lymph node measuring up to 9 mm in cortical thickness.

No thickened left axillary lymph nodes.
IMPRESSION: 1. Large area of suspicious calcifications throughout the outer
aspect of the right breast.
2. Two focal groups of indeterminate calcifications within the
upper-outer left breast.
3. Cortically thickened right axillary lymphadenopathy.

RECOMMENDATION:
1. Stereotactic guided core needle biopsy (2 sites) anterior and
posterior extent suspicious right breast calcifications.
2. Stereotactic guided core needle biopsy of both groups of
calcifications within the upper-outer left breast.
3. Ultrasound-guided core needle biopsy cortically thickened right
axillary lymph node.

Note the 2 stereotactic guided core needle biopsies of the right
breast and ultrasound-guided core needle biopsy right axilla will be
scheduled on the same day.

The patient will be scheduled on a different day for the 2 site
stereotactic guided core needle biopsies of the left breast.

I have discussed the findings and recommendations with the patient.
If applicable, a reminder letter will be sent to the patient
regarding the next appointment.

BI-RADS CATEGORY  4: Suspicious.

## 2022-03-14 IMAGING — MG DIGITAL DIAGNOSTIC BILAT W/ TOMO W/ CAD
8 of 12 series · 8 of 20 positions shown · non-contrast
Comparison: Previous exam(s).

CLINICAL DATA: Patient recalled from screening for bilateral
calcifications.

EXAM:
DIGITAL DIAGNOSTIC BILATERAL MAMMOGRAM WITH CAD AND TOMO
ULTRASOUND BILATERAL BREAST

[L CC (1 of 2)]
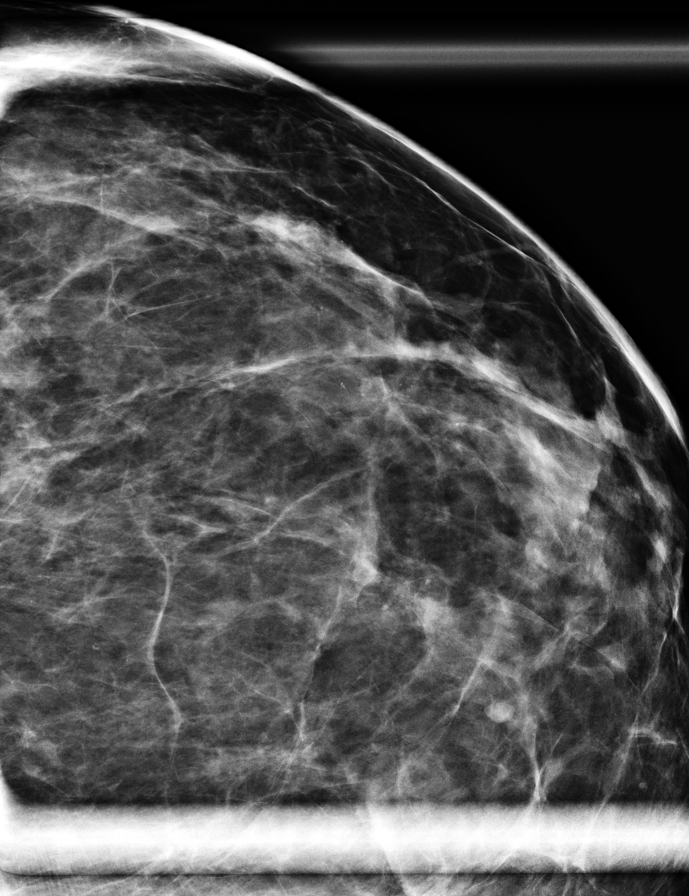

[L CC (2 of 2)]
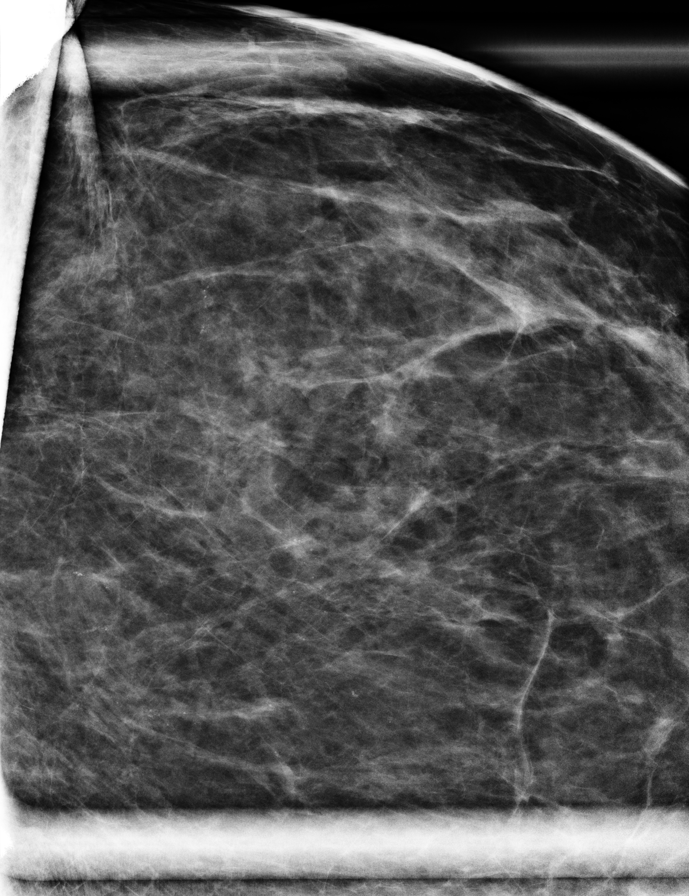

[L LM (1 of 2)]
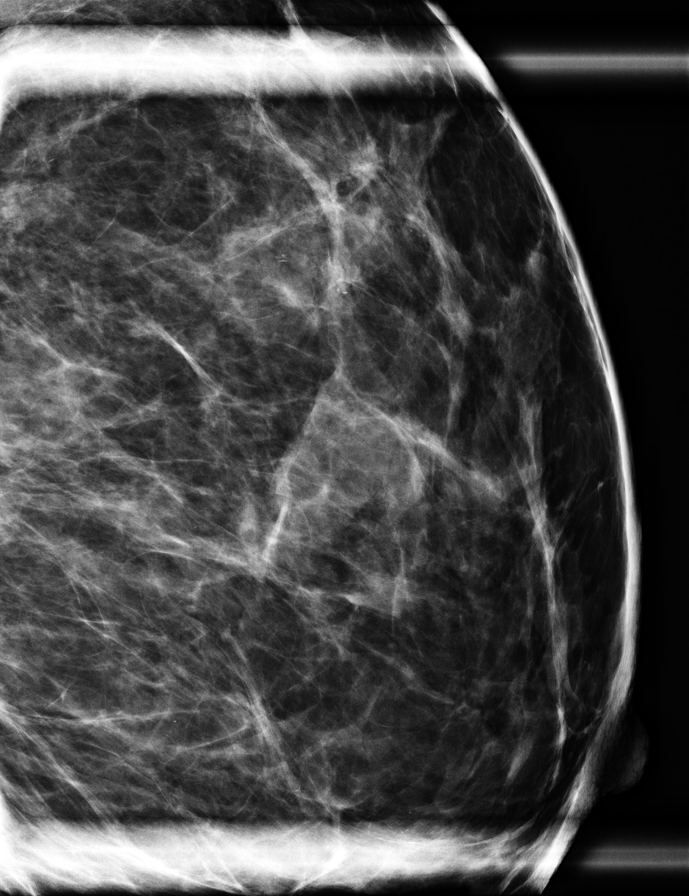

[R LM (1 of 2)]
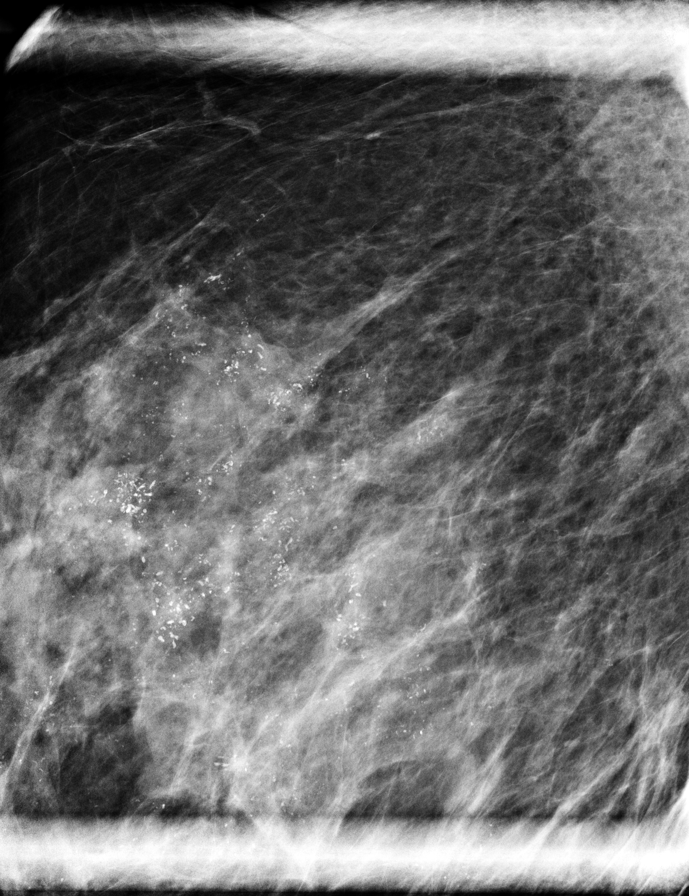

[R CC (1 of 2)]
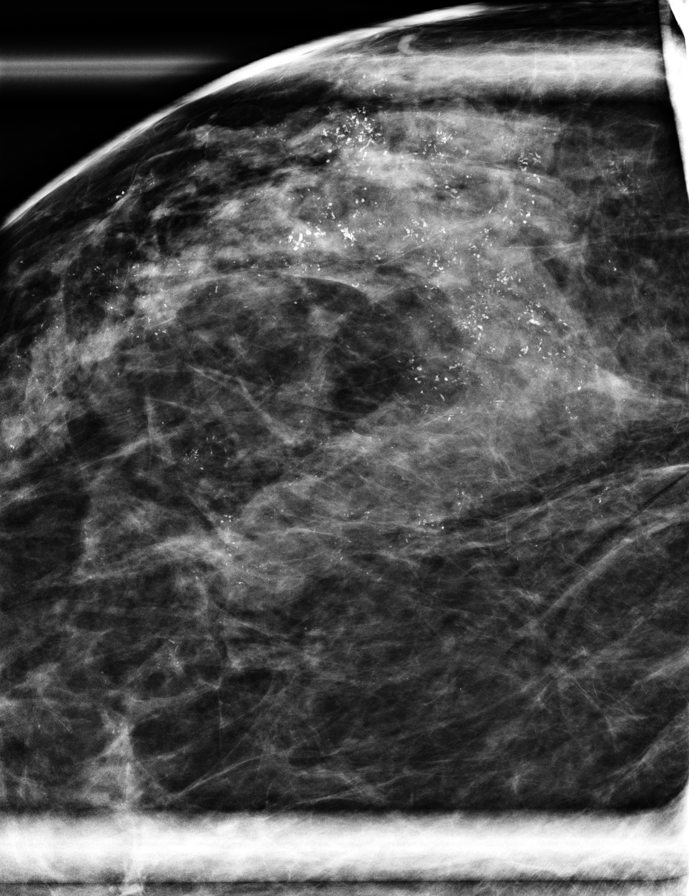

[L LM (2 of 2)]
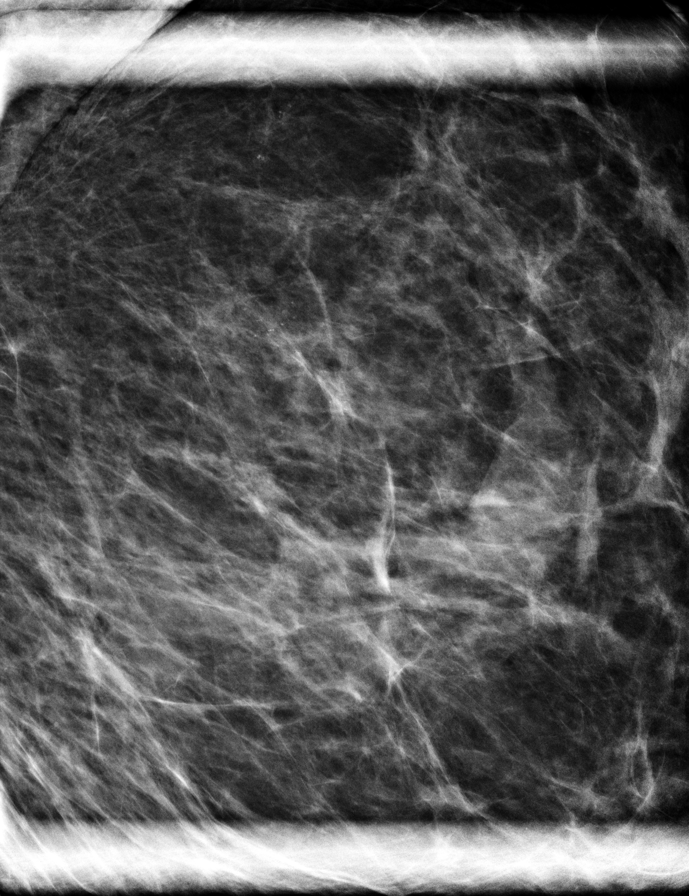

[R CC (2 of 2)]
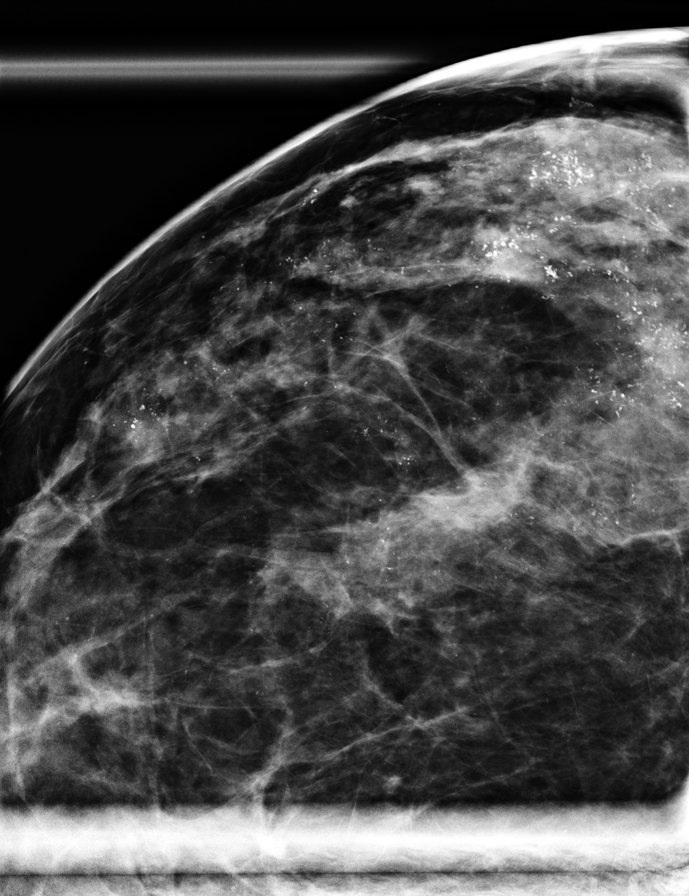

[R LM (2 of 2)]
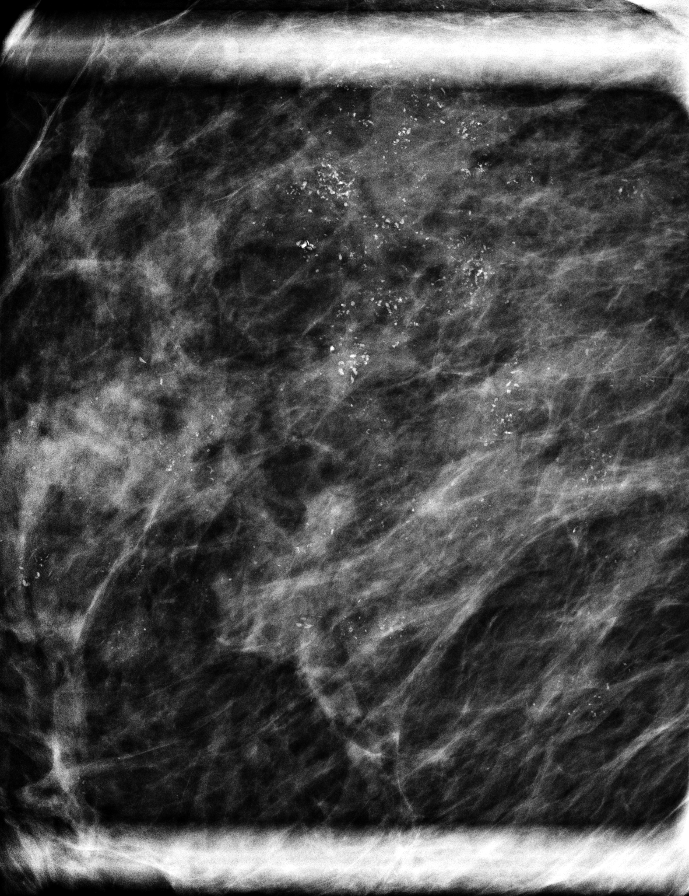

[8 of 20 positions shown; findings below may reference images not displayed]

ACR Breast Density Category c: The breast tissue is heterogeneously
dense, which may obscure small masses.
FINDINGS: Magnification CC and true lateral views of the right and left breast
were obtained.

There are pleomorphic calcifications demonstrated through the upper
outer and lower outer right breast measuring up to 12 x 10 cm in
dimension.

Within the upper-outer left breast there are two groups of punctate
calcifications. One of the groups is within the middle depth and the
additional group is within the posterior depth.

Mammographic images were processed with CAD.

Targeted ultrasound is performed, showing a mildly thickened right
axillary lymph node measuring up to 9 mm in cortical thickness.

No thickened left axillary lymph nodes.
IMPRESSION: 1. Large area of suspicious calcifications throughout the outer
aspect of the right breast.
2. Two focal groups of indeterminate calcifications within the
upper-outer left breast.
3. Cortically thickened right axillary lymphadenopathy.

RECOMMENDATION:
1. Stereotactic guided core needle biopsy (2 sites) anterior and
posterior extent suspicious right breast calcifications.
2. Stereotactic guided core needle biopsy of both groups of
calcifications within the upper-outer left breast.
3. Ultrasound-guided core needle biopsy cortically thickened right
axillary lymph node.

Note the 2 stereotactic guided core needle biopsies of the right
breast and ultrasound-guided core needle biopsy right axilla will be
scheduled on the same day.

The patient will be scheduled on a different day for the 2 site
stereotactic guided core needle biopsies of the left breast.

I have discussed the findings and recommendations with the patient.
If applicable, a reminder letter will be sent to the patient
regarding the next appointment.

BI-RADS CATEGORY  4: Suspicious.

## 2022-03-18 ENCOUNTER — Ambulatory Visit
Admission: RE | Admit: 2022-03-18 | Discharge: 2022-03-18 | Disposition: A | Discharge status: Home / Self Care | Payer: 59 | Source: Ambulatory Visit | Attending: Radiation Oncology | Admitting: Radiation Oncology

## 2022-03-18 VITALS — BP 140/55 | HR 70 | Temp 98.0°F | Resp 16 | Ht 69.0 in | Wt 239.2 lb

## 2022-03-18 DIAGNOSIS — C773 Secondary and unspecified malignant neoplasm of axilla and upper limb lymph nodes: Secondary | ICD-10-CM | POA: Diagnosis not present

## 2022-03-18 DIAGNOSIS — Z171 Estrogen receptor negative status [ER-]: Secondary | ICD-10-CM | POA: Diagnosis not present

## 2022-03-18 DIAGNOSIS — C50111 Malignant neoplasm of central portion of right female breast: Secondary | ICD-10-CM | POA: Diagnosis not present

## 2022-03-18 DIAGNOSIS — Z923 Personal history of irradiation: Secondary | ICD-10-CM | POA: Diagnosis not present

## 2022-03-18 NOTE — Progress Notes (Signed)
Radiation Oncology Follow up Note  Name: Allison Harper   Date:   03/18/2022 MRN:  [id number] DOB: 06-Dec-1970    This 51 y.o. 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 provider found  HPI: Patient is a 51 year old female now out 1 month having completed right.  Chest wall and peripheral lymphatic radiation status post right modified radical mastectomy for stage IIa ER/PR negative HER2/neu overexpressed invasive mammary carcinoma status post right modified radical mastectomy and limited axillary dissection.  Seen today in routine follow-up she is doing well.  Still has some slight hyperpigmentation mostly on her back.  She specifically denies any lymphedema in her right upper extremity or any chest wall masses or nodularity.  COMPLICATIONS OF TREATMENT: none  FOLLOW UP COMPLIANCE: keeps appointments   PHYSICAL EXAM:  BP (!) 140/55   Pulse 70   Temp 98 F (36.7 C)   Resp 16   Ht 5' 9" (1.753 m)   Wt 239 lb 3.2 oz (108.5 kg)   BMI 35.32 kg/m  Patient is status post right modified radical mastectomy incisions well-healed no evidence of mass or nodularity in the chest wall is noted no dominant masses noted in the left breast.  No axillary adenopathy is identified.  No evidence of lymphedema in her right upper extremity is noted.  Well-developed well-nourished patient in NAD. HEENT reveals PERLA, EOMI, discs not visualized.  Oral cavity is clear. No oral mucosal lesions are identified. Neck is clear without evidence of cervical or supraclavicular adenopathy. Lungs are clear to A&P. Cardiac examination is essentially unremarkable with regular rate and rhythm without murmur rub or thrill. Abdomen is benign with no organomegaly or masses noted. Motor  sensory and DTR levels are equal and symmetric in the upper and lower extremities. Cranial nerves II through XII are grossly intact. Proprioception is intact. No peripheral adenopathy or edema is identified. No motor or sensory levels are noted. Crude visual fields are within normal range.  RADIOLOGY RESULTS: No current films to review  PLAN: Present time patient is doing well 1 month out from right chest wall radiation.  And pleased with her overall progress.  She continues on treatment through medical oncology for her HER2/neu overexpressed status.  I have asked to see her back in 6 months for follow-up.  Patient is to call with any concerns.  I would like to take this opportunity to thank you for allowing me to participate in the care of your patient.Noreene Filbert, MD

## 2022-03-19 ENCOUNTER — Other Ambulatory Visit: Payer: Self-pay

## 2022-03-22 ENCOUNTER — Ambulatory Visit: Payer: 59

## 2022-03-22 ENCOUNTER — Other Ambulatory Visit: Payer: 59

## 2022-03-22 ENCOUNTER — Inpatient Hospital Stay: Payer: 59

## 2022-03-22 ENCOUNTER — Inpatient Hospital Stay: Payer: 59 | Attending: Radiation Oncology

## 2022-03-22 VITALS — BP 146/75 | HR 80 | Temp 98.2°F | Resp 18

## 2022-03-22 DIAGNOSIS — Z171 Estrogen receptor negative status [ER-]: Secondary | ICD-10-CM

## 2022-03-22 DIAGNOSIS — C50111 Malignant neoplasm of central portion of right female breast: Secondary | ICD-10-CM | POA: Diagnosis present

## 2022-03-22 DIAGNOSIS — Z5112 Encounter for antineoplastic immunotherapy: Secondary | ICD-10-CM | POA: Insufficient documentation

## 2022-03-22 DIAGNOSIS — C773 Secondary and unspecified malignant neoplasm of axilla and upper limb lymph nodes: Secondary | ICD-10-CM | POA: Diagnosis present

## 2022-03-22 LAB — COMPREHENSIVE METABOLIC PANEL
ALT: 14 U/L (ref 0–44)
AST: 24 U/L (ref 15–41)
Albumin: 4 g/dL (ref 3.5–5.0)
Alkaline Phosphatase: 66 U/L (ref 38–126)
Anion gap: 11 (ref 5–15)
BUN: 10 mg/dL (ref 6–20)
CO2: 23 mmol/L (ref 22–32)
Calcium: 9 mg/dL (ref 8.9–10.3)
Chloride: 102 mmol/L (ref 98–111)
Creatinine, Ser: 0.62 mg/dL (ref 0.44–1.00)
GFR, Estimated: >60 mL/min (ref >60)
Glucose, Bld: 164 mg/dL — ABNORMAL HIGH (ref 70–99)
Potassium: 3.7 mmol/L (ref 3.5–5.1)
Sodium: 136 mmol/L (ref 135–145)
Total Bilirubin: 0.3 mg/dL (ref 0.3–1.2)
Total Protein: 7.5 g/dL (ref 6.5–8.1)

## 2022-03-22 LAB — CBC WITH DIFFERENTIAL/PLATELET
Abs Immature Granulocytes: 0.01 10*3/uL (ref 0.00–0.07)
Basophils Absolute: 0.1 10*3/uL (ref 0.0–0.1)
Basophils Relative: 1 %
Eosinophils Absolute: 0.2 10*3/uL (ref 0.0–0.5)
Eosinophils Relative: 4 %
HCT: 37.1 % (ref 36.0–46.0)
Hemoglobin: 12.1 g/dL (ref 12.0–15.0)
Immature Granulocytes: 0 %
Lymphocytes Relative: 28 %
Lymphs Abs: 1.7 10*3/uL (ref 0.7–4.0)
MCH: 28.3 pg (ref 26.0–34.0)
MCHC: 32.6 g/dL (ref 30.0–36.0)
MCV: 86.7 fL (ref 80.0–100.0)
Monocytes Absolute: 0.4 10*3/uL (ref 0.1–1.0)
Monocytes Relative: 7 %
Neutro Abs: 3.6 10*3/uL (ref 1.7–7.7)
Neutrophils Relative %: 60 %
Platelets: 250 10*3/uL (ref 150–400)
RBC: 4.28 MIL/uL (ref 3.87–5.11)
RDW: 13.2 % (ref 11.5–15.5)
WBC: 5.9 10*3/uL (ref 4.0–10.5)
nRBC: 0 % (ref 0.0–0.2)

## 2022-03-22 LAB — MAGNESIUM: Magnesium: 1.5 mg/dL — ABNORMAL LOW (ref 1.7–2.4)

## 2022-03-22 MED ORDER — PERTUZ-TRASTUZ-HYALURON-ZZXF 60-60-2000 MG-MG-U/ML CHEMO ~~LOC~~ SOLN
10.0000 mL | Freq: Once | SUBCUTANEOUS | Status: AC
  Administered 2022-03-22: 10 mL via SUBCUTANEOUS
  Filled 2022-03-22: qty 10

## 2022-03-22 MED ORDER — DIPHENHYDRAMINE HCL 25 MG PO CAPS
50.0000 mg | ORAL_CAPSULE | Freq: Once | ORAL | Status: AC
  Administered 2022-03-22: 50 mg via ORAL
  Filled 2022-03-22: qty 2

## 2022-03-22 MED ORDER — ACETAMINOPHEN 325 MG PO TABS
650.0000 mg | ORAL_TABLET | Freq: Once | ORAL | Status: AC
  Administered 2022-03-22: 650 mg via ORAL
  Filled 2022-03-22: qty 2

## 2022-03-22 NOTE — Patient Instructions (Signed)
[facility] CANCER CTR AT [place]  Discharge Instructions: Thank you for choosing [facility] to provide your oncology and hematology care.  If you have a lab appointment with the [facility], please go directly to the [facility] and check in at the registration area.  Wear comfortable clothing and clothing appropriate for easy access to any Portacath or PICC line.   We strive to give you quality time with your provider. You may need to reschedule your appointment if you arrive late (15 or more minutes).  Arriving late affects you and other patients whose appointments are after yours.  Also, if you miss three or more appointments without notifying the office, you may be dismissed from the clinic at the provider's discretion.      For prescription refill requests, have your pharmacy contact our office and allow 72 hours for refills to be completed.    Today you received the following chemotherapy and/or immunotherapy agents PHESGO      To help prevent nausea and vomiting after your treatment, we encourage you to take your nausea medication as directed.  BELOW ARE SYMPTOMS THAT SHOULD BE REPORTED IMMEDIATELY: *FEVER GREATER THAN 100.4 F (38 C) OR HIGHER *CHILLS OR SWEATING *NAUSEA AND VOMITING THAT IS NOT CONTROLLED WITH YOUR NAUSEA MEDICATION *UNUSUAL SHORTNESS OF BREATH *UNUSUAL BRUISING OR BLEEDING *URINARY PROBLEMS (pain or burning when urinating, or frequent urination) *BOWEL PROBLEMS (unusual diarrhea, constipation, pain near the anus) TENDERNESS IN MOUTH AND THROAT WITH OR WITHOUT PRESENCE OF ULCERS (sore throat, sores in mouth, or a toothache) UNUSUAL RASH, SWELLING OR PAIN  UNUSUAL VAGINAL DISCHARGE OR ITCHING   Items with * indicate a potential emergency and should be followed up as soon as possible or go to the Emergency Department if any problems should occur.  Please show the CHEMOTHERAPY ALERT CARD or IMMUNOTHERAPY ALERT CARD at check-in to the  Emergency Department and triage nurse.  Should you have questions after your visit or need to cancel or reschedule your appointment, please contact [facility] CANCER [place] AT [place]  [phone] and follow the prompts.  Office hours are 8:00 a.m. to 4:30 p.m. Monday - Friday. Please note that voicemails left after 4:00 p.m. may not be returned until the following business day.  We are closed weekends and major holidays. You have access to a nurse at all times for urgent questions. Please call the main number to the clinic [phone] and follow the prompts.  For any non-urgent questions, you may also contact your provider using MyChart. We now offer e-Visits for anyone 51 and older to request care online for non-urgent symptoms. For details visit [email or web address].   Also download the MyChart app! Go to the app store, search "MyChart", open the app, select [facility] [place], and log in with your MyChart username and password.  Masks are optional in the cancer centers. If you would like for your care team to wear a mask while they are taking care of you, please let them know. For doctor visits, patients may have with them one support person who is at least 52 years old. At this time, visitors are not allowed in the infusion area.  Pertuzumab; Trastuzumab; Hyaluronidase Injection What is this medication? PERTUZUMAB; TRASTUZUMAB; HYALURONIDASE (per TOOZ ue mab; tras TOO zoo mab; hye al ur ON i dase) treats breast cancer. Pertuzumab and trastuzumab work by blocking a protein that causes cancer cells to grow and multiply. This helps to slow or stop the spread of cancer cells. Hyaluronidase  works by increasing the absorption of other medications in the body to help them work better. It is a combination medication that contains two monoclonal antibodies. This medicine may be used for other purposes; ask your health care provider or pharmacist if you have questions. COMMON BRAND NAME(S):  PHESGO What should I tell my care team before I take this medication? They need to know if you have any of these conditions: Heart failure High blood pressure Irregular heartbeat or rhythm Lung disease An unusual or allergic reaction to pertuzumab, trastuzumab, hyaluronidase, other medications, foods, dyes, or preservatives Pregnant or trying to get pregnant Breast-feeding How should I use this medication? This medication is injected under the skin. It is usually given by your care team in a hospital or clinic setting. It may also be given at home by your care team. Talk to your care team about the use of this medication in children. Special care may be needed. Overdosage: If you think you have taken too much of this medicine contact a poison control center or emergency room at once. NOTE: This medicine is only for you. Do not share this medicine with others. What if I miss a dose? Keep appointments for follow-up doses. It is important not to miss your dose. Call your care team if you are unable to keep an appointment. What may interact with this medication? Certain types of chemotherapy, such as daunorubicin, doxorubicin, epirubicin, idarubicin This list may not describe all possible interactions. Give your health care provider a list of all the medicines, herbs, non-prescription drugs, or dietary supplements you use. Also tell them if you smoke, drink alcohol, or use illegal drugs. Some items may interact with your medicine. What should I watch for while using this medication? Your condition will be monitored carefully while you are receiving this medication. This medication may make you feel generally unwell. This is not uncommon as chemotherapy can affect healthy cells as well as cancer cells. Report any side effects. Continue your course of treatment even though you feel ill unless your care team tells you to stop. This medication may increase your risk of getting an infection. Call your  care team for advice if you get a fever, chills, sore throat, or other symptoms of a cold or flu. Do not treat yourself. Try to avoid being around people who are sick. Avoid taking medications that contain aspirin, acetaminophen, ibuprofen, naproxen, or ketoprofen unless instructed by your care team. These medications may hide a fever. Talk to your care team if you may be pregnant. Serious birth defects can occur if you take this medication during pregnancy and for 7 months after the last dose. You will need a negative pregnancy test before starting this medication. Contraception is recommended while taking this medication and for 7 months after the last dose. Your care team can help you find the option that works for you. Do not breastfeed while taking this medication and for 7 months after the last dose. What side effects may I notice from receiving this medication? Side effects that you should report to your care team as soon as possible: Allergic reactions or angioedema--skin rash, itching or hives, swelling of the face, eyes, lips, tongue, arms, or legs, trouble swallowing or breathing Dry cough, shortness of breath or trouble breathing Heart failure--shortness of breath, swelling of the ankles, feet, or hands, sudden weight gain, unusual weakness or fatigue Infection--fever, chills, cough, or sore throat Infusion reactions--chest pain, shortness of breath or trouble breathing, feeling faint or  lightheaded Side effects that usually do not require medical attention (report these to your care team if they continue or are bothersome): Diarrhea Hair loss Nausea Pain, redness, or irritation at injection site Pain, redness, or swelling with sores inside the mouth or throat Unusual weakness or fatigue This list may not describe all possible side effects. Call your doctor for medical advice about side effects. You may report side effects to FDA at 1-800-FDA-1088. Where should I keep my  medication? This medication is given in a hospital or clinic. It will not be stored at home. NOTE: This sheet is a summary. It may not cover all possible information. If you have questions about this medicine, talk to your doctor, pharmacist, or health care provider.  2023 Elsevier/Gold Standard (2021-09-04 00:00:00)

## 2022-04-05 ENCOUNTER — Encounter: Payer: Self-pay | Admitting: Internal Medicine

## 2022-04-12 ENCOUNTER — Other Ambulatory Visit: Payer: 59

## 2022-04-12 ENCOUNTER — Inpatient Hospital Stay: Payer: 59 | Attending: Radiation Oncology

## 2022-04-12 VITALS — BP 115/70 | HR 81 | Temp 97.0°F | Resp 16

## 2022-04-12 DIAGNOSIS — Z5112 Encounter for antineoplastic immunotherapy: Secondary | ICD-10-CM | POA: Insufficient documentation

## 2022-04-12 DIAGNOSIS — C773 Secondary and unspecified malignant neoplasm of axilla and upper limb lymph nodes: Secondary | ICD-10-CM | POA: Insufficient documentation

## 2022-04-12 DIAGNOSIS — Z171 Estrogen receptor negative status [ER-]: Secondary | ICD-10-CM | POA: Insufficient documentation

## 2022-04-12 DIAGNOSIS — C50111 Malignant neoplasm of central portion of right female breast: Secondary | ICD-10-CM | POA: Diagnosis present

## 2022-04-12 MED ORDER — DIPHENHYDRAMINE HCL 25 MG PO CAPS
50.0000 mg | ORAL_CAPSULE | Freq: Once | ORAL | Status: AC
  Administered 2022-04-12: 50 mg via ORAL
  Filled 2022-04-12: qty 2

## 2022-04-12 MED ORDER — ACETAMINOPHEN 325 MG PO TABS
650.0000 mg | ORAL_TABLET | Freq: Once | ORAL | Status: AC
  Administered 2022-04-12: 650 mg via ORAL
  Filled 2022-04-12: qty 2

## 2022-04-12 MED ORDER — PERTUZ-TRASTUZ-HYALURON-ZZXF 60-60-2000 MG-MG-U/ML CHEMO ~~LOC~~ SOLN
10.0000 mL | Freq: Once | SUBCUTANEOUS | Status: AC
  Administered 2022-04-12: 10 mL via SUBCUTANEOUS
  Filled 2022-04-12: qty 10

## 2022-04-12 MED ORDER — PERTUZ-TRASTUZ-HYALURON-ZZXF 60-60-2000 MG-MG-U/ML CHEMO ~~LOC~~ SOLN: 10.0000 mL | Freq: Once | SUBCUTANEOUS | Status: DC

## 2022-04-12 NOTE — Patient Instructions (Signed)
[facility]CER CTR AT [place]-[facility]ICAL [facility]OG[facility]  Discharge Instructions: Thank you for choosing [facility] to provide your oncology and hematology care.  If you have a lab appointment with the Cancer Center, please go directly to the Cancer Center and check in at the registration area.  Wear comfortable clothing and clothing appropriate for easy access to any Portacath or PICC line.   We strive to give you quality time with your provider. You may need to reschedule your appointment if you arrive late (15 or more minutes).  Arriving late affects you and other patients whose appointments are after yours.  Also, if you miss three or more appointments without notifying the office, you may be dismissed from the clinic at the provider's discretion.      For prescription refill requests, have your pharmacy contact our office and allow 72 hours for refills to be completed.    Today you received the following chemotherapy and/or immunotherapy agents Phesgo      To help prevent nausea and vomiting after your treatment, we encourage you to take your nausea medication as directed.  BELOW ARE SYMPTOMS THAT SHOULD BE REPORTED IMMEDIATELY: *FEVER GREATER THAN 100.4 F (38 C) OR HIGHER *CHILLS OR SWEATING *NAUSEA AND VOMITING THAT IS NOT CONTROLLED WITH YOUR NAUSEA MEDICATION *UNUSUAL SHORTNESS OF BREATH *UNUSUAL BRUISING OR BLEEDING *URINARY PROBLEMS (pain or burning when urinating, or frequent urination) *BOWEL PROBLEMS (unusual diarrhea, constipation, pain near the anus) TENDERNESS IN MOUTH AND THROAT WITH OR WITHOUT PRESENCE OF ULCERS (sore throat, sores in mouth, or a toothache) UNUSUAL RASH, SWELLING OR PAIN  UNUSUAL VAGINAL DISCHARGE OR ITCHING   Items with * indicate a potential emergency and should be followed up as soon as possible or go to the Emergency Department if any problems should occur.  Please show the CHEMOTHERAPY ALERT CARD or IMMUNOTHERAPY ALERT CARD at check-in to the  Emergency Department and triage nurse.  Should you have questions after your visit or need to cancel or reschedule your appointment, please contact [facility] AT [place]-[facility]COLOG[facility]  [phone] and follow the prompts.  Office hours are 8:00 a.m. to 4:30 p.m. Monday - Friday. Please note that voicemails left after 4:00 p.m. may not be returned until the following business day.  We are closed weekends and major holidays. You have access to a nurse at all times for urgent questions. Please call the main number to the clinic [phone] and follow the prompts.  For any non-urgent questions, you may also contact your provider using MyChart. We now offer e-Visits for anyone 18 and older to request care online for non-urgent symptoms. For details visit [email or web address].   Also download the MyChart app! Go to the app store, search "MyChart", open the app, select [facility], and log in with your MyChart username and password.  Masks are optional in the cancer centers. If you would like for your care team to wear a mask while they are taking care of you, please let them know. For doctor visits, patients may have with them one support person who is at least 52 years old. At this time, visitors are not allowed in the infusion area.   

## 2022-05-03 ENCOUNTER — Inpatient Hospital Stay: Payer: 59

## 2022-05-03 ENCOUNTER — Inpatient Hospital Stay (HOSPITAL_BASED_OUTPATIENT_CLINIC_OR_DEPARTMENT_OTHER): Payer: 59 | Admitting: Internal Medicine

## 2022-05-03 VITALS — BP 147/81 | HR 76 | Temp 97.7°F | Resp 18 | Ht 69.0 in | Wt 245.3 lb

## 2022-05-03 DIAGNOSIS — Z171 Estrogen receptor negative status [ER-]: Secondary | ICD-10-CM

## 2022-05-03 DIAGNOSIS — C50111 Malignant neoplasm of central portion of right female breast: Secondary | ICD-10-CM

## 2022-05-03 DIAGNOSIS — Z5112 Encounter for antineoplastic immunotherapy: Secondary | ICD-10-CM | POA: Diagnosis not present

## 2022-05-03 LAB — COMPREHENSIVE METABOLIC PANEL
ALT: 13 U/L (ref 0–44)
AST: 20 U/L (ref 15–41)
Albumin: 4 g/dL (ref 3.5–5.0)
Alkaline Phosphatase: 76 U/L (ref 38–126)
Anion gap: 10 (ref 5–15)
BUN: 12 mg/dL (ref 6–20)
CO2: 26 mmol/L (ref 22–32)
Calcium: 9.3 mg/dL (ref 8.9–10.3)
Chloride: 102 mmol/L (ref 98–111)
Creatinine, Ser: 0.79 mg/dL (ref 0.44–1.00)
GFR, Estimated: >60 mL/min (ref >60)
Glucose, Bld: 158 mg/dL — ABNORMAL HIGH (ref 70–99)
Potassium: 4.2 mmol/L (ref 3.5–5.1)
Sodium: 138 mmol/L (ref 135–145)
Total Bilirubin: 0.4 mg/dL (ref 0.3–1.2)
Total Protein: 7.4 g/dL (ref 6.5–8.1)

## 2022-05-03 LAB — CBC
HCT: 37.8 % (ref 36.0–46.0)
Hemoglobin: 12.5 g/dL (ref 12.0–15.0)
MCH: 27.7 pg (ref 26.0–34.0)
MCHC: 33.1 g/dL (ref 30.0–36.0)
MCV: 83.8 fL (ref 80.0–100.0)
Platelets: 256 10*3/uL (ref 150–400)
RBC: 4.51 MIL/uL (ref 3.87–5.11)
RDW: 13.3 % (ref 11.5–15.5)
WBC: 6.5 10*3/uL (ref 4.0–10.5)
nRBC: 0 % (ref 0.0–0.2)

## 2022-05-03 LAB — MAGNESIUM: Magnesium: 1.8 mg/dL (ref 1.7–2.4)

## 2022-05-03 MED ORDER — ACETAMINOPHEN 325 MG PO TABS
650.0000 mg | ORAL_TABLET | Freq: Once | ORAL | Status: AC
  Administered 2022-05-03: 650 mg via ORAL
  Filled 2022-05-03: qty 2

## 2022-05-03 MED ORDER — SODIUM CHLORIDE 0.9 % IV SOLN
Freq: Once | INTRAVENOUS | Status: DC
  Filled 2022-05-03: qty 250

## 2022-05-03 MED ORDER — DIPHENHYDRAMINE HCL 25 MG PO CAPS
25.0000 mg | ORAL_CAPSULE | Freq: Once | ORAL | Status: AC
  Administered 2022-05-03: 25 mg via ORAL
  Filled 2022-05-03: qty 1

## 2022-05-03 MED ORDER — PERTUZ-TRASTUZ-HYALURON-ZZXF 60-60-2000 MG-MG-U/ML CHEMO ~~LOC~~ SOLN
10.0000 mL | Freq: Once | SUBCUTANEOUS | Status: AC
  Administered 2022-05-03: 10 mL via SUBCUTANEOUS
  Filled 2022-05-03: qty 10

## 2022-05-03 NOTE — Patient Instructions (Signed)
[facility] CANCER CTR AT [place]  Discharge Instructions: Thank you for choosing [facility] to provide your oncology and hematology care.  If you have a lab appointment with the [facility], please go directly to the [facility] and check in at the registration area.  Wear comfortable clothing and clothing appropriate for easy access to any Portacath or PICC line.   We strive to give you quality time with your provider. You may need to reschedule your appointment if you arrive late (15 or more minutes).  Arriving late affects you and other patients whose appointments are after yours.  Also, if you miss three or more appointments without notifying the office, you may be dismissed from the clinic at the provider's discretion.      For prescription refill requests, have your pharmacy contact our office and allow 72 hours for refills to be completed.    Today you received the following chemotherapy and/or immunotherapy agents- Phesgo      To help prevent nausea and vomiting after your treatment, we encourage you to take your nausea medication as directed.  BELOW ARE SYMPTOMS THAT SHOULD BE REPORTED IMMEDIATELY: *FEVER GREATER THAN 100.4 F (38 C) OR HIGHER *CHILLS OR SWEATING *NAUSEA AND VOMITING THAT IS NOT CONTROLLED WITH YOUR NAUSEA MEDICATION *UNUSUAL SHORTNESS OF BREATH *UNUSUAL BRUISING OR BLEEDING *URINARY PROBLEMS (pain or burning when urinating, or frequent urination) *BOWEL PROBLEMS (unusual diarrhea, constipation, pain near the anus) TENDERNESS IN MOUTH AND THROAT WITH OR WITHOUT PRESENCE OF ULCERS (sore throat, sores in mouth, or a toothache) UNUSUAL RASH, SWELLING OR PAIN  UNUSUAL VAGINAL DISCHARGE OR ITCHING   Items with * indicate a potential emergency and should be followed up as soon as possible or go to the Emergency Department if any problems should occur.  Please show the CHEMOTHERAPY ALERT CARD or IMMUNOTHERAPY ALERT CARD at check-in to the  Emergency Department and triage nurse.  Should you have questions after your visit or need to cancel or reschedule your appointment, please contact [facility] CANCER [place] AT [place]  [phone] and follow the prompts.  Office hours are 8:00 a.m. to 4:30 p.m. Monday - Friday. Please note that voicemails left after 4:00 p.m. may not be returned until the following business day.  We are closed weekends and major holidays. You have access to a nurse at all times for urgent questions. Please call the main number to the clinic [phone] and follow the prompts.  For any non-urgent questions, you may also contact your provider using MyChart. We now offer e-Visits for anyone 84 and older to request care online for non-urgent symptoms. For details visit [email or web address].   Also download the MyChart app! Go to the app store, search "MyChart", open the app, select [place], and log in with your MyChart username and password.

## 2022-05-03 NOTE — Assessment & Plan Note (Addendum)
#  04/20/2021: Right breast invasive mammary carcinoma s/p mastectomy and SNL, pT1c(m) pN1a, ER low positive, PR negative and HER2 amplified-stage II.  Currently status post  adjuvant TCH-P x6 cycles [last 72/27]; Dr.Chrystal- [s/p  01/28/2022]. Phesgo cycle 1-9/15/2023 [[place]]  # Proceed with Phesgo every 3 weeks. [Total of 1 year- until May 2023].  Tolerating well without any major side effects.   We discussed continuing echo cardiograms per protocol every 3 months while on Phesgo.  NOV 2023- Left ventricular ejection fraction, by estimation, is 55 to 60%.  # 07/13/2021: Status post FNA left lung nodule showing neoplastic cells consistent with low-grade neuroendocrine tumor. 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   PET APRIL 2023- left lower lobe pulmonary nodules measuring up to 13 mm.  Recommend follow-up with pulmonary-regarding follow-up imaging.  # Neuropathy/myalgias: Grade 1  Improved on magnesium; exercising-   # PCOS/ steroids induced- DM- PP BG- 156- on 1/2 metformin-we will monitor patient closely.     # DISPOSITION:   # today Phesgo  As per IS-  # in 3 weeks-; no MD/nol abs- ONLY Phesgo;    # in 6 weeks-MD ; labs- cbc/cmp;mag;- Phesgo  Dr.B

## 2022-05-03 NOTE — Progress Notes (Signed)
Patient denies new problems/concerns today.   °

## 2022-05-03 NOTE — Progress Notes (Signed)
[facility] CONSULT NOTE  Patient Care Team: Pcp, No as PCP - General Byrnett, Forest Gleason, MD as Consulting Physician (General Surgery)  CHIEF COMPLAINTS/PURPOSE OF CONSULTATION: breast cancer   Oncology History Overview Note  Final Diagnosis    1.  Lymph node, right axilla, excision:   -One lymph node, negative for carcinoma (0/1).   2. Sentinel lymph node, right axilla #1, excision:   -One lymph node, negative for carcinoma; multiple levels examined (0/1).   3. Sentinel lymph node, right axilla #2, excision:   -One lymph node, negative for carcinoma; multiple levels examined (0/1).   4. Sentinel lymph node, right axilla #3, excision:   -Two lymph nodes, negative for carcinoma; multiple levels examined (0/2).   5.  Lymph node, right axilla, excision:   -One lymph node, positive for carcinoma (1/1). -Metastatic focus measures greater than 3 mm. -No extranodal extension is seen.   6.  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 -Invasive focus measures 12 mm microscopically (measured on slide 6E). 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 -Margins are negative for in situ and invasive carcinoma; closest is posterior at 7 mm and greater than 10 mm respectively. -Usual ductal hyperplasia, cystic apocrine metaplasia, fibroadenomatoid changes and biopsy site changes present. -pT1c ([id number] M-not applicable; AJCC stage IIA. -Please see synoptic report below.    # 04/20/2021: [Dr.Rao- ]Right breast invasive mammary carcinoma; declined neoadjuvant chemotherapy.  S/p mastectomy and SNL, pT1c(m) pN1a, ER low positive, PR negative and HER2 amplified-stage II.  Currently status post   adjuvant TCH-P x6 cycles [last 72/27]; Dr.Chrystal- [s/p  01/28/2022]. Phesgo cycle 1 day 1   [01/18/2022]  # OCT 6th, 2023- Phesgo every 3 weeks for total of 11 more cycles. [Total of 1 year].   # 07/13/2021: Status post FNA left lung nodule showing neoplastic cells consistent with low-grade neuroendocrine tumor [[facility]]. 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 Awaiting  PET/CT scan per Dr. Kern Alberta.   # PCOS/DM-sec to steroids- metformin   Malignant neoplasm of central portion of right breast in female, estrogen receptor negative ([place])  05/11/2021 Cancer Staging   Staging form: Breast, AJCC 8th Edition - Pathologic stage from 05/11/2021: Stage IIA (pT1c, pN1a(sn), cM0, G3, ER-, PR-, HER2+) - Signed by Sindy Guadeloupe, MD on 05/20/2021 Stage prefix: Initial diagnosis Method of lymph node assessment: Sentinel lymph node biopsy Multigene prognostic tests performed: None Histologic grading system: 3 grade system Residual tumor (R): R0 - None   05/20/2021 Initial Diagnosis   Malignant neoplasm of central portion of right breast in female, estrogen receptor negative ([place])   08/07/2021 -  Chemotherapy   Patient is on Treatment Plan : BREAST DOCEtaxel + Trastuzumab + Pertuzumab (THP) q21d x 8 cycles / Trastuzumab + Pertuzumab q21d x 4 cycles      HISTORY OF PRESENTING ILLNESS: Ambulating independently.  Alone.   Money A Reinoso 51 y.o.  female history of ER/PR negative HER2/neu positive  Stage II right breast cancer status post mastectomy currently on adjuvant Traz-Perjeta/Phesgo is here for follow-up.  Patient denies new problems/concerns today.  Patient denies any leg cramps.  Continues to be on magnesium mentation.  Otherwise denies any leg swelling.  Review of Systems  Constitutional:  Negative for chills, diaphoresis, fever, malaise/fatigue and weight loss.  HENT:  Negative for nosebleeds and sore throat.   Eyes:  Negative for  double vision.  Respiratory:  Negative for cough, hemoptysis, sputum production, shortness of breath and wheezing.   Cardiovascular:  Negative for chest pain, palpitations, orthopnea and leg swelling.  Gastrointestinal:  Negative for abdominal pain, blood in stool, constipation, diarrhea, heartburn, melena, nausea and vomiting.  Genitourinary:  Negative for dysuria, frequency and urgency.  Musculoskeletal:  Positive for joint pain and myalgias. Negative for back pain.  Skin: Negative.  Negative for itching and rash.  Neurological:  Negative for dizziness, tingling, focal weakness, weakness and headaches.  Endo/Heme/Allergies:  Does not bruise/bleed easily.  Psychiatric/Behavioral:  Negative for depression. The patient is not nervous/anxious and does not have insomnia.     MEDICAL HISTORY:  Past Medical History:  Diagnosis Date   Breast cancer ([place])    Breast screening, unspecified 05/06/2010   Obesity 05/06/2010   Other benign neoplasm of connective and other soft tissue of trunk, unspecified 05/06/2010    SURGICAL HISTORY: Past Surgical History:  Procedure Laterality Date   CHOLECYSTECTOMY     CYST EXCISION  2008   from back   LIPOMA EXCISION Left 09/2010   flank   TONSILLECTOMY      SOCIAL HISTORY: Social History   Socioeconomic History   Marital status: Single    Spouse name: Not on file   Number of children: Not on file   Years of education: Not on file   Highest education level: Not on file  Occupational History   Not on file  Tobacco Use   Smoking status: Never   Smokeless tobacco: Not on file  Substance and Sexual Activity   Alcohol use: Yes    Comment: ocassionally, 1-2 per month   Drug use: No   Sexual activity: Not on file  Other Topics Concern   Not on file  Social History Narrative   Not on file   Social Determinants of Health   Financial Resource Strain: Not on file  Food Insecurity: Not on file  Transportation Needs: Not on file  Physical  Activity: Not on file  Stress: Not on file  Social Connections: Not on file  Intimate Partner Violence: Not on file    FAMILY HISTORY: Family History  Problem Relation Age of Onset   Breast cancer Neg Hx     ALLERGIES:  has No Known Allergies.  MEDICATIONS:  Current Outpatient Medications  Medication Sig Dispense Refill   allopurinol (ZYLOPRIM) 100 MG tablet Take 100 mg by mouth daily.     lidocaine-prilocaine (EMLA) cream SMARTSIG:1 Topical Daily     Magnesium Cl-Calcium Carbonate (SLOW MAGNESIUM/CALCIUM) 70-117 MG TBEC Take 1 tablet by mouth 2 (two) times daily. 60 tablet 6   metFORMIN (GLUCOPHAGE) 500 MG tablet Take by mouth.     Multiple Vitamins-Minerals (MULTIVITAMIN WITH MINERALS) tablet Take 1 tablet by mouth daily.     ondansetron (ZOFRAN) 8 MG tablet Take by mouth.     prochlorperazine (COMPAZINE) 10 MG tablet Take by mouth.     dexamethasone (DECADRON) 4 MG tablet Take 16 mg by mouth daily. (Patient not taking: Reported on 01/30/2022)     levofloxacin (LEVAQUIN) 500 MG tablet Take 500 mg by mouth daily. (Patient not taking: Reported on 01/30/2022)     LORazepam (ATIVAN) 0.5 MG tablet Take 0.5 mg by mouth daily as needed. (Patient not taking: Reported on 01/30/2022)  No current facility-administered medications for this visit.   Facility-Administered Medications Ordered in Other Visits  Medication Dose Route Frequency Provider Last Rate Last Admin   0.9 %  sodium chloride infusion   Intravenous Once Charlaine Dalton R, MD       heparin lock flush 100 UNIT/ML injection            Right mastectomy-well-healing.  Erythema noted from radiation.  No disclamation noted.  PHYSICAL EXAMINATION: ECOG PERFORMANCE STATUS: 1 - Symptomatic but completely ambulatory  Vitals:   05/03/22 1300  BP: (!) 147/81  Pulse: 76  Resp: 18  Temp: 97.7 F (36.5 C)   Filed Weights   05/03/22 1300  Weight: 245 lb 4.8 oz (111.3 kg)    Physical Exam Vitals and nursing note  reviewed.  HENT:     Head: Normocephalic and atraumatic.     Mouth/Throat:     Pharynx: Oropharynx is clear.  Eyes:     Extraocular Movements: Extraocular movements intact.     Pupils: Pupils are equal, round, and reactive to light.  Cardiovascular:     Rate and Rhythm: Normal rate and regular rhythm.  Pulmonary:     Breath sounds: Normal breath sounds.  Abdominal:     Palpations: Abdomen is soft.  Musculoskeletal:        General: Normal range of motion.     Cervical back: Normal range of motion.  Skin:    General: Skin is warm.  Neurological:     General: No focal deficit present.     Mental Status: She is alert and oriented to person, place, and time.  Psychiatric:        Behavior: Behavior normal.        Judgment: Judgment normal.    LABORATORY DATA:  I have reviewed the data as listed Lab Results  Component Value Date   WBC 6.5 05/03/2022   HGB 12.5 05/03/2022   HCT 37.8 05/03/2022   MCV 83.8 05/03/2022   PLT 256 05/03/2022   Recent Labs    03/01/22 0828 03/22/22 1513 05/03/22 1316  NA 139 136 138  K 3.8 3.7 4.2  CL 107 102 102  CO2 _0 GLUCOSE 117* 164* 158*  BUN _1 CREATININE 0.71 0.62 0.79  CALCIUM 9.0 9.0 9.3  GFRNONAA >60 >60 >60  PROT 7.0 7.5 7.4  ALBUMIN 3.8 4.0 4.0  AST _2 ALT _3 ALKPHOS 57 66 76  BILITOT 0.5 0.3 0.4    RADIOGRAPHIC STUDIES: I have personally reviewed the radiological images as listed and agreed with the findings in the report. No results found.   Malignant neoplasm of central portion of right breast in female, estrogen receptor negative ([place]) # 04/20/2021: Right breast invasive mammary carcinoma s/p mastectomy and SNL, pT1c(m) pN1a, ER low positive, PR negative and HER2 amplified-stage II.  Currently status post  adjuvant TCH-P x6 cycles [last 72/27]; Dr.Chrystal- [s/p  01/28/2022]. Phesgo cycle 1-9/15/2023 [[facility]]  # Proceed with Phesgo every 3 weeks. [Total of 1 year- until May 2023].   Tolerating well without any major side effects.   We discussed continuing echo cardiograms per protocol every 3 months while on Phesgo.  NOV 2023- Left ventricular ejection fraction, by estimation, is 55 to 60%.  # 07/13/2021: Status post FNA left lung nodule showing neoplastic cells consistent with low-grade neuroendocrine tumor. 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  PET APRIL 2023- left lower lobe pulmonary nodules measuring up to 13 mm.  Recommend follow-up with pulmonary-regarding follow-up imaging.  # Neuropathy/myalgias: Grade 1  Improved on magnesium; exercising-   # PCOS/ steroids induced- DM- PP BG- 156- on 1/2 metformin-we will monitor patient closely.     # DISPOSITION:   # today Phesgo  As per IS-  # in 3 weeks-; no MD/nol abs- ONLY Phesgo;    # in 6 weeks-MD ; labs- cbc/cmp;mag;- Phesgo  Dr.B    Above plan of care was discussed with patient/family in detail.  My contact information was given to the patient/family.     Cammie Sickle, MD 05/03/2022 4:35 PM

## 2022-05-24 ENCOUNTER — Encounter: Payer: Self-pay | Admitting: Internal Medicine

## 2022-05-24 ENCOUNTER — Inpatient Hospital Stay: Payer: 59 | Attending: Radiation Oncology

## 2022-05-24 VITALS — BP 124/58 | HR 90 | Temp 98.5°F | Resp 16

## 2022-05-24 DIAGNOSIS — Z5112 Encounter for antineoplastic immunotherapy: Secondary | ICD-10-CM | POA: Insufficient documentation

## 2022-05-24 DIAGNOSIS — C773 Secondary and unspecified malignant neoplasm of axilla and upper limb lymph nodes: Secondary | ICD-10-CM | POA: Diagnosis present

## 2022-05-24 DIAGNOSIS — C50111 Malignant neoplasm of central portion of right female breast: Secondary | ICD-10-CM | POA: Insufficient documentation

## 2022-05-24 MED ORDER — DIPHENHYDRAMINE HCL 25 MG PO CAPS
25.0000 mg | ORAL_CAPSULE | Freq: Once | ORAL | Status: AC
  Administered 2022-05-24: 25 mg via ORAL
  Filled 2022-05-24: qty 1

## 2022-05-24 MED ORDER — ACETAMINOPHEN 325 MG PO TABS
650.0000 mg | ORAL_TABLET | Freq: Once | ORAL | Status: AC
  Administered 2022-05-24: 650 mg via ORAL
  Filled 2022-05-24: qty 2

## 2022-05-24 MED ORDER — PERTUZ-TRASTUZ-HYALURON-ZZXF 60-60-2000 MG-MG-U/ML CHEMO ~~LOC~~ SOLN
10.0000 mL | Freq: Once | SUBCUTANEOUS | Status: AC
  Administered 2022-05-24: 10 mL via SUBCUTANEOUS
  Filled 2022-05-24: qty 10

## 2022-05-24 NOTE — Progress Notes (Signed)
Pt remained 30 minute post-injection without complication.

## 2022-05-24 NOTE — Patient Instructions (Signed)
[facility]  Discharge Instructions: Thank you for choosing [facility] to provide your oncology and hematology care.  If you have a lab appointment with the Cancer Center, please go directly to the Cancer Center and check in at the registration area.  Wear comfortable clothing and clothing appropriate for easy access to any Portacath or PICC line.   We strive to give you quality time with your provider. You may need to reschedule your appointment if you arrive late (15 or more minutes).  Arriving late affects you and other patients whose appointments are after yours.  Also, if you miss three or more appointments without notifying the office, you may be dismissed from the clinic at the provider's discretion.      For prescription refill requests, have your pharmacy contact our office and allow 72 hours for refills to be completed.    Today you received the following chemotherapy and/or immunotherapy agents- phesgo      To help prevent nausea and vomiting after your treatment, we encourage you to take your nausea medication as directed.  BELOW ARE SYMPTOMS THAT SHOULD BE REPORTED IMMEDIATELY: *FEVER GREATER THAN 100.4 F (38 C) OR HIGHER *CHILLS OR SWEATING *NAUSEA AND VOMITING THAT IS NOT CONTROLLED WITH YOUR NAUSEA MEDICATION *UNUSUAL SHORTNESS OF BREATH *UNUSUAL BRUISING OR BLEEDING *URINARY PROBLEMS (pain or burning when urinating, or frequent urination) *BOWEL PROBLEMS (unusual diarrhea, constipation, pain near the anus) TENDERNESS IN MOUTH AND THROAT WITH OR WITHOUT PRESENCE OF ULCERS (sore throat, sores in mouth, or a toothache) UNUSUAL RASH, SWELLING OR PAIN  UNUSUAL VAGINAL DISCHARGE OR ITCHING   Items with * indicate a potential emergency and should be followed up as soon as possible or go to the Emergency Department if any problems should occur.  Please show the CHEMOTHERAPY ALERT CARD or IMMUNOTHERAPY ALERT CARD at check-in to  the Emergency Department and triage nurse.  Should you have questions after your visit or need to cancel or reschedule your appointment, please contact [facility]  [phone] and follow the prompts.  Office hours are 8:00 a.m. to 4:30 p.m. Monday - Friday. Please note that voicemails left after 4:00 p.m. may not be returned until the following business day.  We are closed weekends and major holidays. You have access to a nurse at all times for urgent questions. Please call the main number to the clinic [phone] and follow the prompts.  For any non-urgent questions, you may also contact your provider using MyChart. We now offer e-Visits for anyone 18 and older to request care online for non-urgent symptoms. For details visit [email or web address].   Also download the MyChart app! Go to the app store, search "MyChart", open the app, select [facility], and log in with your MyChart username and password.    

## 2022-06-07 ENCOUNTER — Encounter: Payer: Self-pay | Admitting: Internal Medicine

## 2022-06-10 ENCOUNTER — Encounter: Payer: Self-pay | Admitting: Internal Medicine

## 2022-06-10 ENCOUNTER — Other Ambulatory Visit: Payer: Self-pay | Admitting: Internal Medicine

## 2022-06-10 DIAGNOSIS — Z171 Estrogen receptor negative status [ER-]: Secondary | ICD-10-CM

## 2022-06-14 ENCOUNTER — Encounter: Payer: Self-pay | Admitting: Internal Medicine

## 2022-06-14 ENCOUNTER — Inpatient Hospital Stay: Payer: 59 | Attending: Radiation Oncology | Admitting: Internal Medicine

## 2022-06-14 ENCOUNTER — Inpatient Hospital Stay: Payer: 59

## 2022-06-14 VITALS — BP 153/82 | HR 79 | Temp 97.9°F | Resp 16 | Wt 252.0 lb

## 2022-06-14 DIAGNOSIS — Z171 Estrogen receptor negative status [ER-]: Secondary | ICD-10-CM | POA: Diagnosis not present

## 2022-06-14 DIAGNOSIS — Z5112 Encounter for antineoplastic immunotherapy: Secondary | ICD-10-CM | POA: Diagnosis present

## 2022-06-14 DIAGNOSIS — Z9221 Personal history of antineoplastic chemotherapy: Secondary | ICD-10-CM

## 2022-06-14 DIAGNOSIS — E282 Polycystic ovarian syndrome: Secondary | ICD-10-CM | POA: Insufficient documentation

## 2022-06-14 DIAGNOSIS — E119 Type 2 diabetes mellitus without complications: Secondary | ICD-10-CM | POA: Diagnosis not present

## 2022-06-14 DIAGNOSIS — C50111 Malignant neoplasm of central portion of right female breast: Secondary | ICD-10-CM | POA: Diagnosis present

## 2022-06-14 DIAGNOSIS — G629 Polyneuropathy, unspecified: Secondary | ICD-10-CM | POA: Diagnosis not present

## 2022-06-14 DIAGNOSIS — C773 Secondary and unspecified malignant neoplasm of axilla and upper limb lymph nodes: Secondary | ICD-10-CM | POA: Insufficient documentation

## 2022-06-14 LAB — CBC WITH DIFFERENTIAL/PLATELET
Abs Immature Granulocytes: 0.02 10*3/uL (ref 0.00–0.07)
Basophils Absolute: 0.1 10*3/uL (ref 0.0–0.1)
Basophils Relative: 1 %
Eosinophils Absolute: 0.2 10*3/uL (ref 0.0–0.5)
Eosinophils Relative: 3 %
HCT: 35.4 % — ABNORMAL LOW (ref 36.0–46.0)
Hemoglobin: 11.7 g/dL — ABNORMAL LOW (ref 12.0–15.0)
Immature Granulocytes: 0 %
Lymphocytes Relative: 28 %
Lymphs Abs: 1.7 10*3/uL (ref 0.7–4.0)
MCH: 28.1 pg (ref 26.0–34.0)
MCHC: 33.1 g/dL (ref 30.0–36.0)
MCV: 85.1 fL (ref 80.0–100.0)
Monocytes Absolute: 0.4 10*3/uL (ref 0.1–1.0)
Monocytes Relative: 7 %
Neutro Abs: 3.6 10*3/uL (ref 1.7–7.7)
Neutrophils Relative %: 61 %
Platelets: 253 10*3/uL (ref 150–400)
RBC: 4.16 MIL/uL (ref 3.87–5.11)
RDW: 13.3 % (ref 11.5–15.5)
WBC: 6 10*3/uL (ref 4.0–10.5)
nRBC: 0 % (ref 0.0–0.2)

## 2022-06-14 LAB — COMPREHENSIVE METABOLIC PANEL
ALT: 13 U/L (ref 0–44)
AST: 20 U/L (ref 15–41)
Albumin: 3.8 g/dL (ref 3.5–5.0)
Alkaline Phosphatase: 75 U/L (ref 38–126)
Anion gap: 10 (ref 5–15)
BUN: 12 mg/dL (ref 6–20)
CO2: 25 mmol/L (ref 22–32)
Calcium: 8.7 mg/dL — ABNORMAL LOW (ref 8.9–10.3)
Chloride: 100 mmol/L (ref 98–111)
Creatinine, Ser: 0.67 mg/dL (ref 0.44–1.00)
GFR, Estimated: >60 mL/min (ref >60)
Glucose, Bld: 219 mg/dL — ABNORMAL HIGH (ref 70–99)
Potassium: 3.7 mmol/L (ref 3.5–5.1)
Sodium: 135 mmol/L (ref 135–145)
Total Bilirubin: 0.5 mg/dL (ref 0.3–1.2)
Total Protein: 7 g/dL (ref 6.5–8.1)

## 2022-06-14 LAB — MAGNESIUM: Magnesium: 1.4 mg/dL — ABNORMAL LOW (ref 1.7–2.4)

## 2022-06-14 MED ORDER — SODIUM CHLORIDE 0.9 % IV SOLN
Freq: Once | INTRAVENOUS | Status: DC
  Filled 2022-06-14: qty 250

## 2022-06-14 MED ORDER — PERTUZ-TRASTUZ-HYALURON-ZZXF 60-60-2000 MG-MG-U/ML CHEMO ~~LOC~~ SOLN
10.0000 mL | Freq: Once | SUBCUTANEOUS | Status: AC
  Administered 2022-06-14: 10 mL via SUBCUTANEOUS
  Filled 2022-06-14: qty 10

## 2022-06-14 MED ORDER — DIPHENHYDRAMINE HCL 25 MG PO CAPS
25.0000 mg | ORAL_CAPSULE | Freq: Once | ORAL | Status: AC
  Administered 2022-06-14: 25 mg via ORAL
  Filled 2022-06-14: qty 1

## 2022-06-14 MED ORDER — SODIUM CHLORIDE 0.9% FLUSH
10.0000 mL | INTRAVENOUS | Status: DC | PRN
  Administered 2022-06-14: 10 mL via INTRAVENOUS
  Filled 2022-06-14: qty 10

## 2022-06-14 MED ORDER — HEPARIN SOD (PORK) LOCK FLUSH 100 UNIT/ML IV SOLN
500.0000 [IU] | Freq: Once | INTRAVENOUS | Status: AC
  Administered 2022-06-14: 500 [IU] via INTRAVENOUS
  Filled 2022-06-14: qty 5

## 2022-06-14 MED ORDER — ACETAMINOPHEN 325 MG PO TABS
650.0000 mg | ORAL_TABLET | Freq: Once | ORAL | Status: AC
  Administered 2022-06-14: 650 mg via ORAL
  Filled 2022-06-14: qty 2

## 2022-06-14 NOTE — Assessment & Plan Note (Addendum)
#   04/20/2021: Right breast invasive mammary carcinoma s/p mastectomy and SNL, pT1c(m) pN1a, ER low positive, PR negative and HER2 amplified-stage II.  Currently status post  adjuvant TCH-P x6 cycles [last 72/27]; Dr.Chrystal- [s/p  01/28/2022]. Phesgo cycle #1 on 01/18/2022 [[facility]]  # Proceed with Phesgo every 3 weeks. [Total of 1 year- until May 2023].  Tolerating well without any major side effects.   We discussed continuing echo cardiograms per protocol every 3 months while on Phesgo.  NOV 2023- Left ventricular ejection fraction, by estimation, is 55 to 60%.will repeat echo/ordered today.  # 07/13/2021: Status post FNA left lung nodule showing neoplastic cells consistent with low-grade neuroendocrine tumor. 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   PET APRIL 2023- left lower lobe pulmonary nodules measuring up to 13 mm.  Recommend follow-up with pulmonary-regarding follow-up imaging.  # Neuropathy/myalgias/hypomagnesemia: Grade 1-2 recommend compliance with magnesium; exercising-   # weight gain/PCOS/ steroids induced- DM- PP BG- 219-  recommend re-starting metformin-we will monitor patient closely.    # DISPOSITION:   # today Phesgo As per IS- # in 3 weeks-; no MD/no labs- ONLY Phesgo;    # in 3/21-Thursday-6 weeks-MD ; labs- cbc/cmp;mag;- Phesgo; 2 d echo prior-  Dr.B

## 2022-06-14 NOTE — Progress Notes (Signed)
[facility] CONSULT NOTE  Patient Care Team: Pcp, No as PCP - General Byrnett, Forest Gleason, MD as Consulting Physician (General Surgery)  CHIEF COMPLAINTS/PURPOSE OF CONSULTATION: breast cancer   Oncology History Overview Note  Final Diagnosis    1.  Lymph node, right axilla, excision:   -One lymph node, negative for carcinoma (0/1).   2. Sentinel lymph node, right axilla #1, excision:   -One lymph node, negative for carcinoma; multiple levels examined (0/1).   3. Sentinel lymph node, right axilla #2, excision:   -One lymph node, negative for carcinoma; multiple levels examined (0/1).   4. Sentinel lymph node, right axilla #3, excision:   -Two lymph nodes, negative for carcinoma; multiple levels examined (0/2).   5.  Lymph node, right axilla, excision:   -One lymph node, positive for carcinoma (1/1). -Metastatic focus measures greater than 3 mm. -No extranodal extension is seen.   6.  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 -Invasive focus measures 12 mm microscopically (measured on slide 6E). 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 -Margins are negative for in situ and invasive carcinoma; closest is posterior at 7 mm and greater than 10 mm respectively. -Usual ductal hyperplasia, cystic apocrine metaplasia, fibroadenomatoid changes and biopsy site changes present. -pT1c ([id number] M-not applicable; AJCC stage IIA. -Please see synoptic report below.    # 04/20/2021: [Dr.Rao- ]Right breast invasive mammary carcinoma; declined neoadjuvant chemotherapy.  S/p mastectomy and SNL, pT1c(m) pN1a, ER low positive, PR negative and HER2 amplified-stage II.  Currently status post   adjuvant TCH-P x6 cycles [last 72/27]; Dr.Chrystal- [s/p  01/28/2022]. Phesgo cycle 1 day 1   [01/18/2022]  # OCT 6th, 2023- Phesgo every 3 weeks for total of 11 more cycles. [Total of 1 year].   # 07/13/2021: Status post FNA left lung nodule showing neoplastic cells consistent with low-grade neuroendocrine tumor [[facility]]. 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 Awaiting  PET/CT scan per Dr. Kern Alberta.   # PCOS/DM-sec to steroids- metformin   Malignant neoplasm of central portion of right breast in female, estrogen receptor negative ([place])  05/11/2021 Cancer Staging   Staging form: Breast, AJCC 8th Edition - Pathologic stage from 05/11/2021: Stage IIA (pT1c, pN1a(sn), cM0, G3, ER-, PR-, HER2+) - Signed by Sindy Guadeloupe, MD on 05/20/2021 Stage prefix: Initial diagnosis Method of lymph node assessment: Sentinel lymph node biopsy Multigene prognostic tests performed: None Histologic grading system: 3 grade system Residual tumor (R): R0 - None   05/20/2021 Initial Diagnosis   Malignant neoplasm of central portion of right breast in female, estrogen receptor negative ([place])   08/07/2021 -  Chemotherapy   Patient is on Treatment Plan : BREAST DOCEtaxel + Trastuzumab + Pertuzumab (THP) q21d x 8 cycles / Trastuzumab + Pertuzumab q21d x 4 cycles      HISTORY OF PRESENTING ILLNESS: Ambulating independently.  Alone.   Allison Harper 52 y.o.  female history of ER/PR negative HER2/neu positive  Stage II right breast cancer status post mastectomy currently on adjuvant Traz-Perjeta/Phesgo is here for follow-up.  Patient denies new problems/concerns today.  Patient denies any leg cramps.  Continues to be on magnesium mentation.  Otherwise denies any leg swelling.  Review of Systems  Constitutional:  Negative for chills, diaphoresis, fever, malaise/fatigue and weight loss.  HENT:  Negative for nosebleeds and sore throat.   Eyes:  Negative for  double vision.  Respiratory:  Negative for cough, hemoptysis, sputum production, shortness of breath and wheezing.   Cardiovascular:  Negative for chest pain, palpitations, orthopnea and leg swelling.  Gastrointestinal:  Negative for abdominal pain, blood in stool, constipation, diarrhea, heartburn, melena, nausea and vomiting.  Genitourinary:  Negative for dysuria, frequency and urgency.  Musculoskeletal:  Positive for joint pain and myalgias. Negative for back pain.  Skin: Negative.  Negative for itching and rash.  Neurological:  Negative for dizziness, tingling, focal weakness, weakness and headaches.  Endo/Heme/Allergies:  Does not bruise/bleed easily.  Psychiatric/Behavioral:  Negative for depression. The patient is not nervous/anxious and does not have insomnia.     MEDICAL HISTORY:  Past Medical History:  Diagnosis Date   Breast cancer ([place])    Breast screening, unspecified 05/06/2010   Obesity 05/06/2010   Other benign neoplasm of connective and other soft tissue of trunk, unspecified 05/06/2010    SURGICAL HISTORY: Past Surgical History:  Procedure Laterality Date   CHOLECYSTECTOMY     CYST EXCISION  2008   from back   LIPOMA EXCISION Left 09/2010   flank   TONSILLECTOMY      SOCIAL HISTORY: Social History   Socioeconomic History   Marital status: Single    Spouse name: Not on file   Number of children: Not on file   Years of education: Not on file   Highest education level: Not on file  Occupational History   Not on file  Tobacco Use   Smoking status: Never   Smokeless tobacco: Not on file  Substance and Sexual Activity   Alcohol use: Yes    Comment: ocassionally, 1-2 per month   Drug use: No   Sexual activity: Not on file  Other Topics Concern   Not on file  Social History Narrative   Not on file   Social Determinants of Health   Financial Resource Strain: Not on file  Food Insecurity: Not on file  Transportation Needs: Not on file  Physical  Activity: Not on file  Stress: Not on file  Social Connections: Not on file  Intimate Partner Violence: Not on file    FAMILY HISTORY: Family History  Problem Relation Age of Onset   Breast cancer Neg Hx     ALLERGIES:  has no active allergies.  MEDICATIONS:  Current Outpatient Medications  Medication Sig Dispense Refill   allopurinol (ZYLOPRIM) 100 MG tablet Take 100 mg by mouth daily.     lidocaine-prilocaine (EMLA) cream SMARTSIG:1 Topical Daily     Magnesium Cl-Calcium Carbonate (SLOW MAGNESIUM/CALCIUM) 70-117 MG TBEC Take 1 tablet by mouth 2 (two) times daily. 60 tablet 6   metFORMIN (GLUCOPHAGE) 500 MG tablet Take by mouth.     Multiple Vitamins-Minerals (MULTIVITAMIN WITH MINERALS) tablet Take 1 tablet by mouth daily.     LORazepam (ATIVAN) 0.5 MG tablet Take 0.5 mg by mouth daily as needed. (Patient not taking: Reported on 01/30/2022)     No current facility-administered medications for this visit.   Facility-Administered Medications Ordered in Other Visits  Medication Dose Route Frequency Provider Last Rate Last Admin   heparin lock flush 100 UNIT/ML injection            Right mastectomy-well-healing.  Erythema noted from radiation.  No disclamation noted.  PHYSICAL EXAMINATION: ECOG PERFORMANCE STATUS: 1 -  Symptomatic but completely ambulatory  Vitals:   06/14/22 0855  BP: (!) 153/82  Pulse: 79  Resp: 16  Temp: 97.9 F (36.6 C)   Filed Weights   06/14/22 0855  Weight: 252 lb (114.3 kg)    Physical Exam Vitals and nursing note reviewed.  HENT:     Head: Normocephalic and atraumatic.     Mouth/Throat:     Pharynx: Oropharynx is clear.  Eyes:     Extraocular Movements: Extraocular movements intact.     Pupils: Pupils are equal, round, and reactive to light.  Cardiovascular:     Rate and Rhythm: Normal rate and regular rhythm.  Pulmonary:     Breath sounds: Normal breath sounds.  Abdominal:     Palpations: Abdomen is soft.  Musculoskeletal:         General: Normal range of motion.     Cervical back: Normal range of motion.  Skin:    General: Skin is warm.  Neurological:     General: No focal deficit present.     Mental Status: She is alert and oriented to person, place, and time.  Psychiatric:        Behavior: Behavior normal.        Judgment: Judgment normal.    LABORATORY DATA:  I have reviewed the data as listed Lab Results  Component Value Date   WBC 6.0 06/14/2022   HGB 11.7 (L) 06/14/2022   HCT 35.4 (L) 06/14/2022   MCV 85.1 06/14/2022   PLT 253 06/14/2022   Recent Labs    03/22/22 1513 05/03/22 1316 06/14/22 0816  NA 136 138 135  K 3.7 4.2 3.7  CL 102 102 100  CO2 '23 26 25  '$ GLUCOSE 164* 158* 219*  BUN '10 12 12  '$ CREATININE 0.62 0.79 0.67  CALCIUM 9.0 9.3 8.7*  GFRNONAA >60 >60 >60  PROT 7.5 7.4 7.0  ALBUMIN 4.0 4.0 3.8  AST '24 20 20  '$ ALT '14 13 13  '$ ALKPHOS 66 76 75  BILITOT 0.3 0.4 0.5    RADIOGRAPHIC STUDIES: I have personally reviewed the radiological images as listed and agreed with the findings in the report. No results found.   Malignant neoplasm of central portion of right breast in female, estrogen receptor negative ([place]) # 04/20/2021: Right breast invasive mammary carcinoma s/p mastectomy and SNL, pT1c(m) pN1a, ER low positive, PR negative and HER2 amplified-stage II.  Currently status post  adjuvant TCH-P x6 cycles [last 72/27]; Dr.Chrystal- [s/p  01/28/2022]. Phesgo cycle #1 on 01/18/2022 [[facility]]  # Proceed with Phesgo every 3 weeks. [Total of 1 year- until May 2023].  Tolerating well without any major side effects.   We discussed continuing echo cardiograms per protocol every 3 months while on Phesgo.  NOV 2023- Left ventricular ejection fraction, by estimation, is 55 to 60%.will repeat echo/ordered today.  # 07/13/2021: Status post FNA left lung nodule showing neoplastic cells consistent with low-grade neuroendocrine tumor. 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   PET APRIL 2023- left lower lobe pulmonary nodules measuring up to 13 mm.  Recommend follow-up with pulmonary-regarding follow-up imaging.  # Neuropathy/myalgias/hypomagnesemia: Grade 1-2 recommend compliance with magnesium; exercising-   # weight gain/PCOS/ steroids induced- DM- PP BG- 219-  recommend re-starting metformin-we will monitor patient closely.    # DISPOSITION:   # today Phesgo As per IS- # in 3 weeks-; no MD/no labs- ONLY Phesgo;    #  in 3/21-Thursday-6 weeks-MD ; labs- cbc/cmp;mag;- Phesgo; 2 d echo prior-  Dr.B  Above plan of care was discussed with patient/family in detail.  My contact information was given to the patient/family.     Cammie Sickle, MD 06/14/2022 9:33 AM

## 2022-06-14 NOTE — Progress Notes (Signed)
Pt in for follow up, denies any concerns today. 

## 2022-06-14 NOTE — Patient Instructions (Signed)
[facility]  Discharge Instructions: Thank you for choosing [facility] to provide your oncology and hematology care.  If you have a lab appointment with the [facility], please go directly to the [facility] and check in at the registration area.  Wear comfortable clothing and clothing appropriate for easy access to any Portacath or PICC line.   We strive to give you quality time with your provider. You may need to reschedule your appointment if you arrive late (15 or more minutes).  Arriving late affects you and other patients whose appointments are after yours.  Also, if you miss three or more appointments without notifying the office, you may be dismissed from the clinic at the provider's discretion.      For prescription refill requests, have your pharmacy contact our office and allow 72 hours for refills to be completed.    Today you received the following chemotherapy and/or immunotherapy agents PHESGO      To help prevent nausea and vomiting after your treatment, we encourage you to take your nausea medication as directed.  BELOW ARE SYMPTOMS THAT SHOULD BE REPORTED IMMEDIATELY: *FEVER GREATER THAN 100.4 F (38 C) OR HIGHER *CHILLS OR SWEATING *NAUSEA AND VOMITING THAT IS NOT CONTROLLED WITH YOUR NAUSEA MEDICATION *UNUSUAL SHORTNESS OF BREATH *UNUSUAL BRUISING OR BLEEDING *URINARY PROBLEMS (pain or burning when urinating, or frequent urination) *BOWEL PROBLEMS (unusual diarrhea, constipation, pain near the anus) TENDERNESS IN MOUTH AND THROAT WITH OR WITHOUT PRESENCE OF ULCERS (sore throat, sores in mouth, or a toothache) UNUSUAL RASH, SWELLING OR PAIN  UNUSUAL VAGINAL DISCHARGE OR ITCHING   Items with * indicate a potential emergency and should be followed up as soon as possible or go to the Emergency Department if any problems should occur.  Please show the CHEMOTHERAPY ALERT CARD or IMMUNOTHERAPY ALERT CARD at check-in to the  Emergency Department and triage nurse.  Should you have questions after your visit or need to cancel or reschedule your appointment, please contact [facility]  [phone] and follow the prompts.  Office hours are 8:00 a.m. to 4:30 p.m. Monday - Friday. Please note that voicemails left after 4:00 p.m. may not be returned until the following business day.  We are closed weekends and major holidays. You have access to a nurse at all times for urgent questions. Please call the main number to the clinic [phone] and follow the prompts.  For any non-urgent questions, you may also contact your provider using MyChart. We now offer e-Visits for anyone 36 and older to request care online for non-urgent symptoms. For details visit [email or web address].   Also download the MyChart app! Go to the app store, search "MyChart", open the app, select [facility], and log in with your MyChart username and password.   Pertuzumab; Trastuzumab; Hyaluronidase Injection What is this medication? PERTUZUMAB; TRASTUZUMAB; HYALURONIDASE (per TOOZ ue mab; tras TOO zoo mab; hye al ur ON i dase) treats breast cancer. Pertuzumab and trastuzumab work by blocking a protein that causes cancer cells to grow and multiply. This helps to slow or stop the spread of cancer cells. Hyaluronidase works by increasing the absorption of other medications in the body to help them work better. It is a combination medication that contains two monoclonal antibodies. This medicine may be used for other purposes; ask your health care provider or pharmacist if you have questions. COMMON BRAND NAME(S): PHESGO What should I tell my care team before  I take this medication? They need to know if you have any of these conditions: Heart failure High blood pressure Irregular heartbeat or rhythm Lung disease An unusual or allergic reaction to pertuzumab, trastuzumab, hyaluronidase, other medications, foods, dyes,  or preservatives Pregnant or trying to get pregnant Breast-feeding How should I use this medication? This medication is injected under the skin. It is usually given by your care team in a hospital or clinic setting. It may also be given at home by your care team. Talk to your care team about the use of this medication in children. Special care may be needed. Overdosage: If you think you have taken too much of this medicine contact a poison control center or emergency room at once. NOTE: This medicine is only for you. Do not share this medicine with others. What if I miss a dose? Keep appointments for follow-up doses. It is important not to miss your dose. Call your care team if you are unable to keep an appointment. What may interact with this medication? Certain types of chemotherapy, such as daunorubicin, doxorubicin, epirubicin, idarubicin This list may not describe all possible interactions. Give your health care provider a list of all the medicines, herbs, non-prescription drugs, or dietary supplements you use. Also tell them if you smoke, drink alcohol, or use illegal drugs. Some items may interact with your medicine. What should I watch for while using this medication? Your condition will be monitored carefully while you are receiving this medication. This medication may make you feel generally unwell. This is not uncommon as chemotherapy can affect healthy cells as well as cancer cells. Report any side effects. Continue your course of treatment even though you feel ill unless your care team tells you to stop. This medication may increase your risk of getting an infection. Call your care team for advice if you get a fever, chills, sore throat, or other symptoms of a cold or flu. Do not treat yourself. Try to avoid being around people who are sick. Avoid taking medications that contain aspirin, acetaminophen, ibuprofen, naproxen, or ketoprofen unless instructed by your care team. These  medications may hide a fever. Talk to your care team if you may be pregnant. Serious birth defects can occur if you take this medication during pregnancy and for 7 months after the last dose. You will need a negative pregnancy test before starting this medication. Contraception is recommended while taking this medication and for 7 months after the last dose. Your care team can help you find the option that works for you. Do not breastfeed while taking this medication and for 7 months after the last dose. What side effects may I notice from receiving this medication? Side effects that you should report to your care team as soon as possible: Allergic reactions or angioedema--skin rash, itching or hives, swelling of the face, eyes, lips, tongue, arms, or legs, trouble swallowing or breathing Dry cough, shortness of breath or trouble breathing Heart failure--shortness of breath, swelling of the ankles, feet, or hands, sudden weight gain, unusual weakness or fatigue Infection--fever, chills, cough, or sore throat Infusion reactions--chest pain, shortness of breath or trouble breathing, feeling faint or lightheaded Side effects that usually do not require medical attention (report these to your care team if they continue or are bothersome): Diarrhea Hair loss Nausea Pain, redness, or irritation at injection site Pain, redness, or swelling with sores inside the mouth or throat Unusual weakness or fatigue This list may not describe all possible side effects.  Call your doctor for medical advice about side effects. You may report side effects to FDA at 1-800-FDA-1088. Where should I keep my medication? This medication is given in a hospital or clinic. It will not be stored at home. NOTE: This sheet is a summary. It may not cover all possible information. If you have questions about this medicine, talk to your doctor, pharmacist, or health care provider.  2023 Elsevier/Gold Standard (2021-09-04  00:00:00)

## 2022-07-05 ENCOUNTER — Inpatient Hospital Stay: Payer: 59 | Attending: Radiation Oncology

## 2022-07-05 VITALS — BP 131/63 | HR 75 | Temp 97.2°F | Resp 15

## 2022-07-05 DIAGNOSIS — C773 Secondary and unspecified malignant neoplasm of axilla and upper limb lymph nodes: Secondary | ICD-10-CM | POA: Insufficient documentation

## 2022-07-05 DIAGNOSIS — C50111 Malignant neoplasm of central portion of right female breast: Secondary | ICD-10-CM | POA: Insufficient documentation

## 2022-07-05 DIAGNOSIS — Z5112 Encounter for antineoplastic immunotherapy: Secondary | ICD-10-CM | POA: Insufficient documentation

## 2022-07-05 MED ORDER — ACETAMINOPHEN 325 MG PO TABS
650.0000 mg | ORAL_TABLET | Freq: Once | ORAL | Status: AC
  Administered 2022-07-05: 650 mg via ORAL
  Filled 2022-07-05: qty 2

## 2022-07-05 MED ORDER — PERTUZ-TRASTUZ-HYALURON-ZZXF 60-60-2000 MG-MG-U/ML CHEMO ~~LOC~~ SOLN
10.0000 mL | Freq: Once | SUBCUTANEOUS | Status: AC
  Administered 2022-07-05: 10 mL via SUBCUTANEOUS
  Filled 2022-07-05: qty 10

## 2022-07-05 MED ORDER — DIPHENHYDRAMINE HCL 25 MG PO CAPS
25.0000 mg | ORAL_CAPSULE | Freq: Once | ORAL | Status: AC
  Administered 2022-07-05: 25 mg via ORAL
  Filled 2022-07-05: qty 1

## 2022-07-05 NOTE — Patient Instructions (Signed)
[facility]  Discharge Instructions: Thank you for choosing [facility] to provide your oncology and hematology care.  If you have a lab appointment with the [facility], please go directly to the [facility] and check in at the registration area.  Wear comfortable clothing and clothing appropriate for easy access to any Portacath or PICC line.   We strive to give you quality time with your provider. You may need to reschedule your appointment if you arrive late (15 or more minutes).  Arriving late affects you and other patients whose appointments are after yours.  Also, if you miss three or more appointments without notifying the office, you may be dismissed from the clinic at the provider's discretion.      For prescription refill requests, have your pharmacy contact our office and allow 72 hours for refills to be completed.    Today you received the following chemotherapy and/or immunotherapy agents- phesgo      To help prevent nausea and vomiting after your treatment, we encourage you to take your nausea medication as directed.  BELOW ARE SYMPTOMS THAT SHOULD BE REPORTED IMMEDIATELY: *FEVER GREATER THAN 100.4 F (38 C) OR HIGHER *CHILLS OR SWEATING *NAUSEA AND VOMITING THAT IS NOT CONTROLLED WITH YOUR NAUSEA MEDICATION *UNUSUAL SHORTNESS OF BREATH *UNUSUAL BRUISING OR BLEEDING *URINARY PROBLEMS (pain or burning when urinating, or frequent urination) *BOWEL PROBLEMS (unusual diarrhea, constipation, pain near the anus) TENDERNESS IN MOUTH AND THROAT WITH OR WITHOUT PRESENCE OF ULCERS (sore throat, sores in mouth, or a toothache) UNUSUAL RASH, SWELLING OR PAIN  UNUSUAL VAGINAL DISCHARGE OR ITCHING   Items with * indicate a potential emergency and should be followed up as soon as possible or go to the Emergency Department if any problems should occur.  Please show the CHEMOTHERAPY ALERT CARD or IMMUNOTHERAPY ALERT CARD at check-in to  the Emergency Department and triage nurse.  Should you have questions after your visit or need to cancel or reschedule your appointment, please contact [facility]  [phone] and follow the prompts.  Office hours are 8:00 a.m. to 4:30 p.m. Monday - Friday. Please note that voicemails left after 4:00 p.m. may not be returned until the following business day.  We are closed weekends and major holidays. You have access to a nurse at all times for urgent questions. Please call the main number to the clinic [phone] and follow the prompts.  For any non-urgent questions, you may also contact your provider using MyChart. We now offer e-Visits for anyone 57 and older to request care online for non-urgent symptoms. For details visit [email or web address].   Also download the MyChart app! Go to the app store, search "MyChart", open the app, select [facility], and log in with your MyChart username and password.

## 2022-07-05 NOTE — Progress Notes (Signed)
Declined 30 minute observation post-injection. Aware of signs and symptoms of reaction and when to seek medical attention.

## 2022-07-08 ENCOUNTER — Encounter: Payer: Self-pay | Admitting: Internal Medicine

## 2022-07-17 ENCOUNTER — Ambulatory Visit
Admission: RE | Admit: 2022-07-17 | Discharge: 2022-07-17 | Disposition: A | Discharge status: Home / Self Care | Payer: 59 | Source: Ambulatory Visit | Attending: Internal Medicine | Admitting: Internal Medicine

## 2022-07-17 DIAGNOSIS — Z0189 Encounter for other specified special examinations: Secondary | ICD-10-CM | POA: Diagnosis not present

## 2022-07-17 DIAGNOSIS — Z171 Estrogen receptor negative status [ER-]: Secondary | ICD-10-CM | POA: Diagnosis not present

## 2022-07-17 DIAGNOSIS — I080 Rheumatic disorders of both mitral and aortic valves: Secondary | ICD-10-CM | POA: Diagnosis not present

## 2022-07-17 DIAGNOSIS — C50111 Malignant neoplasm of central portion of right female breast: Secondary | ICD-10-CM | POA: Insufficient documentation

## 2022-07-17 DIAGNOSIS — Z711 Person with feared health complaint in whom no diagnosis is made: Secondary | ICD-10-CM | POA: Insufficient documentation

## 2022-07-17 DIAGNOSIS — Z9221 Personal history of antineoplastic chemotherapy: Secondary | ICD-10-CM

## 2022-07-17 LAB — ECHOCARDIOGRAM COMPLETE
AR max vel: 3.12 cm2
AV Area VTI: 3.46 cm2
AV Area mean vel: 3.16 cm2
AV Mean grad: 4 mmHg
AV Peak grad: 8.1 mmHg
Ao pk vel: 1.42 m/s
Area-P 1/2: 3.27 cm2
P 1/2 time: 454 msec
S' Lateral: 2.8 cm

## 2022-07-17 NOTE — Progress Notes (Signed)
*  PRELIMINARY RESULTS* Echocardiogram 2D Echocardiogram has been performed.  Allison Harper 07/17/2022, 9:49 AM

## 2022-07-22 ENCOUNTER — Encounter: Payer: Self-pay | Admitting: Internal Medicine

## 2022-07-22 ENCOUNTER — Other Ambulatory Visit: Payer: Self-pay | Admitting: Internal Medicine

## 2022-07-22 DIAGNOSIS — C50111 Malignant neoplasm of central portion of right female breast: Secondary | ICD-10-CM

## 2022-07-25 ENCOUNTER — Inpatient Hospital Stay: Payer: 59 | Admitting: Internal Medicine

## 2022-07-25 ENCOUNTER — Inpatient Hospital Stay: Payer: 59

## 2022-07-26 ENCOUNTER — Inpatient Hospital Stay: Payer: 59

## 2022-07-26 VITALS — BP 120/66 | HR 71 | Temp 97.2°F | Resp 16 | Wt 249.1 lb

## 2022-07-26 DIAGNOSIS — C50111 Malignant neoplasm of central portion of right female breast: Secondary | ICD-10-CM

## 2022-07-26 DIAGNOSIS — Z5112 Encounter for antineoplastic immunotherapy: Secondary | ICD-10-CM | POA: Diagnosis not present

## 2022-07-26 LAB — COMPREHENSIVE METABOLIC PANEL
ALT: 16 U/L (ref 0–44)
AST: 20 U/L (ref 15–41)
Albumin: 4.4 g/dL (ref 3.5–5.0)
Alkaline Phosphatase: 72 U/L (ref 38–126)
Anion gap: 7 (ref 5–15)
BUN: 16 mg/dL (ref 6–20)
CO2: 25 mmol/L (ref 22–32)
Calcium: 9 mg/dL (ref 8.9–10.3)
Chloride: 103 mmol/L (ref 98–111)
Creatinine, Ser: 0.64 mg/dL (ref 0.44–1.00)
GFR, Estimated: >60 mL/min (ref >60)
Glucose, Bld: 132 mg/dL — ABNORMAL HIGH (ref 70–99)
Potassium: 4.1 mmol/L (ref 3.5–5.1)
Sodium: 135 mmol/L (ref 135–145)
Total Bilirubin: 0.5 mg/dL (ref 0.3–1.2)
Total Protein: 7.9 g/dL (ref 6.5–8.1)

## 2022-07-26 LAB — CBC WITH DIFFERENTIAL/PLATELET
Abs Immature Granulocytes: 0.02 10*3/uL (ref 0.00–0.07)
Basophils Absolute: 0.1 10*3/uL (ref 0.0–0.1)
Basophils Relative: 1 %
Eosinophils Absolute: 0.3 10*3/uL (ref 0.0–0.5)
Eosinophils Relative: 4 %
HCT: 40.1 % (ref 36.0–46.0)
Hemoglobin: 12.8 g/dL (ref 12.0–15.0)
Immature Granulocytes: 0 %
Lymphocytes Relative: 26 %
Lymphs Abs: 1.9 10*3/uL (ref 0.7–4.0)
MCH: 28.1 pg (ref 26.0–34.0)
MCHC: 31.9 g/dL (ref 30.0–36.0)
MCV: 88.1 fL (ref 80.0–100.0)
Monocytes Absolute: 0.4 10*3/uL (ref 0.1–1.0)
Monocytes Relative: 5 %
Neutro Abs: 4.6 10*3/uL (ref 1.7–7.7)
Neutrophils Relative %: 64 %
Platelets: 288 10*3/uL (ref 150–400)
RBC: 4.55 MIL/uL (ref 3.87–5.11)
RDW: 13.2 % (ref 11.5–15.5)
WBC: 7.2 10*3/uL (ref 4.0–10.5)
nRBC: 0 % (ref 0.0–0.2)

## 2022-07-26 LAB — MAGNESIUM: Magnesium: 2 mg/dL (ref 1.7–2.4)

## 2022-07-26 MED ORDER — DIPHENHYDRAMINE HCL 25 MG PO CAPS
25.0000 mg | ORAL_CAPSULE | Freq: Once | ORAL | Status: AC
  Administered 2022-07-26: 25 mg via ORAL

## 2022-07-26 MED ORDER — PERTUZ-TRASTUZ-HYALURON-ZZXF 60-60-2000 MG-MG-U/ML CHEMO ~~LOC~~ SOLN
10.0000 mL | Freq: Once | SUBCUTANEOUS | Status: AC
  Administered 2022-07-26: 10 mL via SUBCUTANEOUS
  Filled 2022-07-26: qty 10

## 2022-07-26 MED ORDER — ACETAMINOPHEN 325 MG PO TABS
650.0000 mg | ORAL_TABLET | Freq: Once | ORAL | Status: AC
  Administered 2022-07-26: 650 mg via ORAL

## 2022-08-16 ENCOUNTER — Encounter: Payer: Self-pay | Admitting: Internal Medicine

## 2022-08-16 ENCOUNTER — Inpatient Hospital Stay: Payer: 59

## 2022-08-16 ENCOUNTER — Other Ambulatory Visit: Payer: 59

## 2022-08-16 ENCOUNTER — Inpatient Hospital Stay: Payer: 59 | Attending: Radiation Oncology | Admitting: Internal Medicine

## 2022-08-16 VITALS — BP 144/64 | HR 72 | Temp 98.0°F | Resp 17 | Wt 256.0 lb

## 2022-08-16 DIAGNOSIS — C7A090 Malignant carcinoid tumor of the bronchus and lung: Secondary | ICD-10-CM | POA: Diagnosis not present

## 2022-08-16 DIAGNOSIS — Z5112 Encounter for antineoplastic immunotherapy: Secondary | ICD-10-CM | POA: Diagnosis present

## 2022-08-16 DIAGNOSIS — Z171 Estrogen receptor negative status [ER-]: Secondary | ICD-10-CM

## 2022-08-16 DIAGNOSIS — C773 Secondary and unspecified malignant neoplasm of axilla and upper limb lymph nodes: Secondary | ICD-10-CM | POA: Diagnosis present

## 2022-08-16 DIAGNOSIS — C50111 Malignant neoplasm of central portion of right female breast: Secondary | ICD-10-CM | POA: Insufficient documentation

## 2022-08-16 MED ORDER — ACETAMINOPHEN 325 MG PO TABS
650.0000 mg | ORAL_TABLET | Freq: Once | ORAL | Status: AC
  Administered 2022-08-16: 650 mg via ORAL

## 2022-08-16 MED ORDER — DIPHENHYDRAMINE HCL 25 MG PO CAPS
25.0000 mg | ORAL_CAPSULE | Freq: Once | ORAL | Status: AC
  Administered 2022-08-16: 25 mg via ORAL

## 2022-08-16 MED ORDER — COLCHICINE 0.6 MG PO TABS: 0.6000 mg | ORAL_TABLET | Freq: Every day | ORAL | 0 refills | Status: DC

## 2022-08-16 MED ORDER — PERTUZ-TRASTUZ-HYALURON-ZZXF 60-60-2000 MG-MG-U/ML CHEMO ~~LOC~~ SOLN
10.0000 mL | Freq: Once | SUBCUTANEOUS | Status: AC
  Administered 2022-08-16: 10 mL via SUBCUTANEOUS
  Filled 2022-08-16: qty 10

## 2022-08-16 NOTE — Assessment & Plan Note (Addendum)
#   04/20/2021: Right breast invasive mammary carcinoma s/p mastectomy and SNL, pT1c(m) pN1a, ER low positive, PR negative and HER2 amplified-stage II.  Currently status post  adjuvant TCH-P x6 cycles [last 72/27]; Dr.Chrystal- [s/p  01/28/2022]. Phesgo cycle #1 on 01/18/2022 [[facility]]  # Proceed with Phesgo every 3 weeks. [Total of 1 year- until May 2023; last in 3 weeks].  Tolerating well without any major side effects. March 13th, 2024- EF 55 to 60%.   # 07/13/2021: Status post FNA left lung nodule showing neoplastic cells consistent with low-grade neuroendocrine tumor. 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   PET APRIL 2023- left lower lobe pulmonary nodules measuring up to 13 mm.  Order dotatate PET scan for further evaluation.  Reluctantly agrees.  # Neuropathy/myalgias/hypomagnesemia: Grade 1-2 continue compliance with magnesium; exercising- stable.   # weight gain/PCOS/ steroids induced- DM- PP BG- 134.  Patient currently on metformin.  Counseled at length regarding stress elevation/obesity management. Over all stable.   # Left toe swelling/acute gout- ? Sec to phesgo vs others. On allopurinol; will try colchicine short course.  Reminded that she will get a short refill; and then she will need to follow-up/establish care with PCP.    # DISPOSITION:   # today Phesgo  # in 3 weeks- No labs- phesgo. # follow up in 6 weeks- MD; no labs- PET scan prior- Dr.B

## 2022-08-16 NOTE — Progress Notes (Signed)
Patient here for oncology follow-up appointment, concerns of Left foot swelling

## 2022-08-16 NOTE — Progress Notes (Signed)
[name]  Patient Care Team: Pcp, No as PCP - General Byrnett, Merrily Pew, MD as Consulting Physician (General Surgery)  CHIEF COMPLAINTS/PURPOSE OF CONSULTATION: breast cancer   Oncology History Overview Note  Final Diagnosis    1.  Lymph node, right axilla, excision:   -One lymph node, negative for carcinoma (0/1).   2. Sentinel lymph node, right axilla #1, excision:   -One lymph node, negative for carcinoma; multiple levels examined (0/1).   3. Sentinel lymph node, right axilla #2, excision:   -One lymph node, negative for carcinoma; multiple levels examined (0/1).   4. Sentinel lymph node, right axilla #3, excision:   -Two lymph nodes, negative for carcinoma; multiple levels examined (0/2).   5.  Lymph node, right axilla, excision:   -One lymph node, positive for carcinoma (1/1). -Metastatic focus measures greater than 3 mm. -No extranodal extension is seen.   6.  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 -Invasive focus measures 12 mm microscopically (measured on slide 6E). 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 -Margins are negative for in situ and invasive carcinoma; closest is posterior at 7 mm and greater than 10 mm respectively. -Usual ductal hyperplasia, cystic apocrine metaplasia, fibroadenomatoid changes and biopsy site changes present. -pT1c (sn)N1a M-not applicable; AJCC stage IIA. -Please see synoptic report below.    # 04/20/2021: [Dr.Rao- ]Right breast invasive mammary carcinoma; declined neoadjuvant chemotherapy.  S/p mastectomy and SNL, pT1c(m) pN1a, ER low positive, PR negative and HER2 amplified-stage II.  Currently status post   adjuvant TCH-P x6 cycles [last 72/27]; Dr.Chrystal- [s/p  01/28/2022]. Phesgo cycle 1 day 1   [01/18/2022]  # OCT 6th, 2023- Phesgo every 3 weeks for total of 11 more cycles. [Total of 1 year].   # 07/13/2021: Status post FNA left lung nodule showing neoplastic cells consistent with low-grade neuroendocrine tumor [[facility]]. 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 Awaiting  PET/CT scan per Dr. Dorena Dew.   # PCOS/DM-sec to steroids- metformin   Malignant neoplasm of central portion of right breast in female, estrogen receptor negative  05/11/2021 Cancer Staging   Staging form: Breast, AJCC 8th Edition - Pathologic stage from 05/11/2021: Stage IIA (pT1c, pN1a(sn), cM0, G3, ER-, PR-, HER2+) - Signed by Creig Hines, MD on 05/20/2021 Stage prefix: Initial diagnosis Method of lymph node assessment: Sentinel lymph node biopsy Multigene prognostic tests performed: None Histologic grading system: 3 grade system Residual tumor (R): R0 - None   05/20/2021 Initial Diagnosis   Malignant neoplasm of central portion of right breast in female, estrogen receptor negative (HCC)   08/07/2021 -  Chemotherapy   Patient is on Treatment Plan : BREAST DOCEtaxel + Trastuzumab + Pertuzumab (THP) q21d x 8 cycles / Trastuzumab + Pertuzumab q21d x 4 cycles      HISTORY OF PRESENTING ILLNESS: Ambulating independently.  Accompanied by significant other.   Allison Harper 52 y.o.  female history of ER/PR negative HER2/neu positive  Stage II right breast cancer status post mastectomy currently on adjuvant Traz-Perjeta/Phesgo is here for follow-up.  Patient expresses concerns of Left foot swelling.  Patient denies any leg cramps.  Continues to be on magnesium supplementation.   Review of  Systems  Constitutional:  Negative for chills, diaphoresis, fever, malaise/fatigue and weight loss.  HENT:  Negative for nosebleeds and sore throat.   Eyes:  Negative for double  vision.  Respiratory:  Negative for cough, hemoptysis, sputum production, shortness of breath and wheezing.   Cardiovascular:  Negative for chest pain, palpitations, orthopnea and leg swelling.  Gastrointestinal:  Negative for abdominal pain, blood in stool, constipation, diarrhea, heartburn, melena, nausea and vomiting.  Genitourinary:  Negative for dysuria, frequency and urgency.  Musculoskeletal:  Positive for joint pain and myalgias. Negative for back pain.  Skin: Negative.  Negative for itching and rash.  Neurological:  Negative for dizziness, tingling, focal weakness, weakness and headaches.  Endo/Heme/Allergies:  Does not bruise/bleed easily.  Psychiatric/Behavioral:  Negative for depression. The patient is not nervous/anxious and does not have insomnia.     MEDICAL HISTORY:  Past Medical History:  Diagnosis Date   Breast cancer    Breast screening, unspecified 05/06/2010   Obesity 05/06/2010   Other benign neoplasm of connective and other soft tissue of trunk, unspecified 05/06/2010    SURGICAL HISTORY: Past Surgical History:  Procedure Laterality Date   CHOLECYSTECTOMY     CYST EXCISION  2008   from back   LIPOMA EXCISION Left 09/2010   flank   TONSILLECTOMY      SOCIAL HISTORY: Social History   Socioeconomic History   Marital status: Single    Spouse name: Not on file   Number of children: Not on file   Years of education: Not on file   Highest education level: Not on file  Occupational History   Not on file  Tobacco Use   Smoking status: Never   Smokeless tobacco: Not on file  Substance and Sexual Activity   Alcohol use: Yes    Comment: ocassionally, 1-2 per month   Drug use: No   Sexual activity: Not on file  Other Topics Concern   Not on file  Social History Narrative   Not on file   Social Determinants of Health   Financial Resource Strain: Not on file  Food Insecurity: Not on file  Transportation Needs: Not on file  Physical Activity: Not on  file  Stress: Not on file  Social Connections: Not on file  Intimate Partner Violence: Not on file    FAMILY HISTORY: Family History  Problem Relation Age of Onset   Breast cancer Neg Hx     ALLERGIES:  is allergic to hydrocodone-acetaminophen.  MEDICATIONS:  Current Outpatient Medications  Medication Sig Dispense Refill   allopurinol (ZYLOPRIM) 100 MG tablet Take 100 mg by mouth daily.     colchicine 0.6 MG tablet Take 1 tablet (0.6 mg total) by mouth daily. 7 tablet 0   lidocaine-prilocaine (EMLA) cream SMARTSIG:1 Topical Daily     Magnesium Cl-Calcium Carbonate (SLOW MAGNESIUM/CALCIUM) 70-117 MG TBEC Take 1 tablet by mouth 2 (two) times daily. 60 tablet 6   metFORMIN (GLUCOPHAGE) 500 MG tablet Take by mouth.     Multiple Vitamins-Minerals (MULTIVITAMIN WITH MINERALS) tablet Take 1 tablet by mouth daily.     LORazepam (ATIVAN) 0.5 MG tablet Take 0.5 mg by mouth daily as needed. (Patient not taking: Reported on 01/30/2022)     No current facility-administered medications for this visit.   Facility-Administered Medications Ordered in Other Visits  Medication Dose Route Frequency Provider Last Rate Last Admin   heparin lock flush 100 UNIT/ML injection            Right mastectomy-well-healing.  Erythema noted from radiation.  No disclamation noted.  PHYSICAL EXAMINATION: ECOG PERFORMANCE STATUS: 1 - Symptomatic but completely ambulatory  Vitals:   08/16/22 1030  BP: (!) 144/64  Pulse: 72  Resp: 17  Temp: 98 F (36.7 C)  SpO2: 99%   Filed Weights   08/16/22 1030  Weight: 256 lb (116.1 kg)    Physical Exam Vitals and nursing note reviewed.  HENT:     Head: Normocephalic and atraumatic.     Mouth/Throat:     Pharynx: Oropharynx is clear.  Eyes:     Extraocular Movements: Extraocular movements intact.     Pupils: Pupils are equal, round, and reactive to light.  Cardiovascular:     Rate and Rhythm: Normal rate and regular rhythm.  Pulmonary:     Breath sounds:  Normal breath sounds.  Abdominal:     Palpations: Abdomen is soft.  Musculoskeletal:        General: Normal range of motion.     Cervical back: Normal range of motion.  Skin:    General: Skin is warm.  Neurological:     General: No focal deficit present.     Mental Status: She is alert and oriented to person, place, and time.  Psychiatric:        Behavior: Behavior normal.        Judgment: Judgment normal.    LABORATORY DATA:  I have reviewed the data as listed Lab Results  Component Value Date   WBC 7.2 07/26/2022   HGB 12.8 07/26/2022   HCT 40.1 07/26/2022   MCV 88.1 07/26/2022   PLT 288 07/26/2022   Recent Labs    05/03/22 1316 06/14/22 0816 07/26/22 1104  NA 138 135 135  K 4.2 3.7 4.1  CL 102 100 103  CO2 26 25 25   GLUCOSE 158* 219* 132*  BUN 12 12 16   CREATININE 0.79 0.67 0.64  CALCIUM 9.3 8.7* 9.0  GFRNONAA >60 >60 >60  PROT 7.4 7.0 7.9  ALBUMIN 4.0 3.8 4.4  AST 20 20 20   ALT 13 13 16   ALKPHOS 76 75 72  BILITOT 0.4 0.5 0.5    RADIOGRAPHIC STUDIES: I have personally reviewed the radiological images as listed and agreed with the findings in the report. No results found.   Malignant neoplasm of central portion of right breast in female, estrogen receptor negative (HCC) # 04/20/2021: Right breast invasive mammary carcinoma s/p mastectomy and SNL, pT1c(m) pN1a, ER low positive, PR negative and HER2 amplified-stage II.  Currently status post  adjuvant TCH-P x6 cycles [last 72/27]; Dr.Chrystal- [s/p  01/28/2022]. Phesgo cycle #1 on 01/18/2022 [[place]]  # Proceed with Phesgo every 3 weeks. [Total of 1 year- until May 2023; last in 3 weeks].  Tolerating well without any major side effects. March 13th, 2024- EF 55 to 60%.   # 07/13/2021: Status post FNA left lung nodule showing neoplastic cells consistent with low-grade neuroendocrine tumor. 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   PET APRIL 2023- left lower lobe pulmonary nodules measuring up to 13 mm.  Order dotatate PET scan for further evaluation.  Reluctantly agrees.  # Neuropathy/myalgias/hypomagnesemia: Grade 1-2 continue compliance with magnesium; exercising- stable.   # weight gain/PCOS/ steroids induced- DM- PP BG- 134.  Patient currently on metformin.  Counseled at length regarding stress elevation/obesity management. Over all stable.   # Left toe swelling/acute gout- ? Sec to phesgo vs others. On allopurinol;  will try colchicine short course.  Reminded that she will get a short refill; and then she will need to follow-up/establish care with PCP.    # DISPOSITION:   # today Phesgo  # in 3 weeks- No labs- phesgo. # follow up in 6 weeks- MD; no labs- PET scan prior- Dr.B   Above plan of care was discussed with patient/family in detail.  My contact information was given to the patient/family.     Earna Coder, MD 08/16/2022 12:05 PM

## 2022-08-18 ENCOUNTER — Other Ambulatory Visit: Payer: Self-pay

## 2022-09-03 ENCOUNTER — Ambulatory Visit: Payer: 59

## 2022-09-03 ENCOUNTER — Encounter: Payer: Self-pay | Admitting: Internal Medicine

## 2022-09-04 ENCOUNTER — Other Ambulatory Visit: Payer: Self-pay | Admitting: Internal Medicine

## 2022-09-04 NOTE — Progress Notes (Signed)
Appt- Changed to may 17th- as awaiting PET scan- may 9th.

## 2022-09-06 ENCOUNTER — Inpatient Hospital Stay: Payer: 59

## 2022-09-06 ENCOUNTER — Ambulatory Visit: Payer: 59

## 2022-09-06 ENCOUNTER — Inpatient Hospital Stay: Payer: 59 | Admitting: Internal Medicine

## 2022-09-06 VITALS — BP 143/63 | HR 68 | Temp 97.9°F | Resp 17 | Wt 255.6 lb

## 2022-09-06 DIAGNOSIS — Z171 Estrogen receptor negative status [ER-]: Secondary | ICD-10-CM | POA: Insufficient documentation

## 2022-09-06 DIAGNOSIS — C773 Secondary and unspecified malignant neoplasm of axilla and upper limb lymph nodes: Secondary | ICD-10-CM | POA: Insufficient documentation

## 2022-09-06 DIAGNOSIS — C50111 Malignant neoplasm of central portion of right female breast: Secondary | ICD-10-CM | POA: Insufficient documentation

## 2022-09-06 DIAGNOSIS — Z5112 Encounter for antineoplastic immunotherapy: Secondary | ICD-10-CM | POA: Insufficient documentation

## 2022-09-06 DIAGNOSIS — M791 Myalgia, unspecified site: Secondary | ICD-10-CM | POA: Insufficient documentation

## 2022-09-06 DIAGNOSIS — C7A090 Malignant carcinoid tumor of the bronchus and lung: Secondary | ICD-10-CM | POA: Insufficient documentation

## 2022-09-06 DIAGNOSIS — G629 Polyneuropathy, unspecified: Secondary | ICD-10-CM | POA: Insufficient documentation

## 2022-09-06 MED ORDER — DIPHENHYDRAMINE HCL 25 MG PO CAPS
25.0000 mg | ORAL_CAPSULE | Freq: Once | ORAL | Status: AC
  Administered 2022-09-06: 25 mg via ORAL
  Filled 2022-09-06: qty 1

## 2022-09-06 MED ORDER — PERTUZ-TRASTUZ-HYALURON-ZZXF 60-60-2000 MG-MG-U/ML CHEMO ~~LOC~~ SOLN
10.0000 mL | Freq: Once | SUBCUTANEOUS | Status: AC
  Administered 2022-09-06: 10 mL via SUBCUTANEOUS
  Filled 2022-09-06: qty 10

## 2022-09-06 MED ORDER — ACETAMINOPHEN 325 MG PO TABS
650.0000 mg | ORAL_TABLET | Freq: Once | ORAL | Status: AC
  Administered 2022-09-06: 650 mg via ORAL
  Filled 2022-09-06: qty 2

## 2022-09-06 NOTE — Patient Instructions (Signed)
[facility]  Discharge Instructions: Thank you for choosing [facility] to provide your oncology and hematology care.  If you have a lab appointment with the Cancer Center, please go directly to the Cancer Center and check in at the registration area.  Wear comfortable clothing and clothing appropriate for easy access to any Portacath or PICC line.   We strive to give you quality time with your provider. You may need to reschedule your appointment if you arrive late (15 or more minutes).  Arriving late affects you and other patients whose appointments are after yours.  Also, if you miss three or more appointments without notifying the office, you may be dismissed from the clinic at the provider's discretion.      For prescription refill requests, have your pharmacy contact our office and allow 72 hours for refills to be completed.    Today you received the following chemotherapy and/or immunotherapy agents Phesgo      To help prevent nausea and vomiting after your treatment, we encourage you to take your nausea medication as directed.  BELOW ARE SYMPTOMS THAT SHOULD BE REPORTED IMMEDIATELY: *FEVER GREATER THAN 100.4 F (38 C) OR HIGHER *CHILLS OR SWEATING *NAUSEA AND VOMITING THAT IS NOT CONTROLLED WITH YOUR NAUSEA MEDICATION *UNUSUAL SHORTNESS OF BREATH *UNUSUAL BRUISING OR BLEEDING *URINARY PROBLEMS (pain or burning when urinating, or frequent urination) *BOWEL PROBLEMS (unusual diarrhea, constipation, pain near the anus) TENDERNESS IN MOUTH AND THROAT WITH OR WITHOUT PRESENCE OF ULCERS (sore throat, sores in mouth, or a toothache) UNUSUAL RASH, SWELLING OR PAIN  UNUSUAL VAGINAL DISCHARGE OR ITCHING   Items with * indicate a potential emergency and should be followed up as soon as possible or go to the Emergency Department if any problems should occur.  Please show the CHEMOTHERAPY ALERT CARD or IMMUNOTHERAPY ALERT CARD at check-in to the  Emergency Department and triage nurse.  Should you have questions after your visit or need to cancel or reschedule your appointment, please contact [facility]  [phone] and follow the prompts.  Office hours are 8:00 a.m. to 4:30 p.m. Monday - Friday. Please note that voicemails left after 4:00 p.m. may not be returned until the following business day.  We are closed weekends and major holidays. You have access to a nurse at all times for urgent questions. Please call the main number to the clinic [phone] and follow the prompts.  For any non-urgent questions, you may also contact your provider using MyChart. We now offer e-Visits for anyone 7 and older to request care online for non-urgent symptoms. For details visit [email or web address].   Also download the MyChart app! Go to the app store, search "MyChart", open the app, select [facility], and log in with your MyChart username and password.

## 2022-09-12 ENCOUNTER — Ambulatory Visit
Admission: RE | Admit: 2022-09-12 | Discharge: 2022-09-12 | Disposition: A | Discharge status: Home / Self Care | Payer: 59 | Source: Ambulatory Visit | Attending: Internal Medicine | Admitting: Internal Medicine

## 2022-09-12 DIAGNOSIS — Z171 Estrogen receptor negative status [ER-]: Secondary | ICD-10-CM | POA: Insufficient documentation

## 2022-09-12 DIAGNOSIS — C7A090 Malignant carcinoid tumor of the bronchus and lung: Secondary | ICD-10-CM | POA: Diagnosis present

## 2022-09-12 DIAGNOSIS — D3A8 Other benign neuroendocrine tumors: Secondary | ICD-10-CM | POA: Insufficient documentation

## 2022-09-12 DIAGNOSIS — C50111 Malignant neoplasm of central portion of right female breast: Secondary | ICD-10-CM | POA: Insufficient documentation

## 2022-09-12 MED ORDER — DIPHENHYDRAMINE HCL 25 MG PO CAPS
25.0000 mg | ORAL_CAPSULE | Freq: Once | ORAL | Status: DC
  Filled 2022-09-12: qty 1

## 2022-09-12 MED ORDER — COPPER CU 64 DOTATATE 1 MCI/ML IV SOLN
4.0000 | Freq: Once | INTRAVENOUS | Status: AC
  Administered 2022-09-12: 4.2 via INTRAVENOUS

## 2022-09-12 MED ORDER — DIPHENHYDRAMINE HCL 25 MG PO CAPS
ORAL_CAPSULE | ORAL | Status: AC
  Filled 2022-09-12: qty 1

## 2022-09-23 ENCOUNTER — Encounter: Payer: Self-pay | Admitting: Internal Medicine

## 2022-09-23 ENCOUNTER — Ambulatory Visit
Admission: RE | Admit: 2022-09-23 | Discharge: 2022-09-23 | Disposition: A | Discharge status: Home / Self Care | Payer: 59 | Source: Ambulatory Visit | Attending: Radiation Oncology | Admitting: Radiation Oncology

## 2022-09-23 ENCOUNTER — Inpatient Hospital Stay (HOSPITAL_BASED_OUTPATIENT_CLINIC_OR_DEPARTMENT_OTHER): Payer: 59 | Admitting: Internal Medicine

## 2022-09-23 VITALS — BP 149/63 | HR 84 | Temp 97.5°F | Wt 256.0 lb

## 2022-09-23 DIAGNOSIS — Z171 Estrogen receptor negative status [ER-]: Secondary | ICD-10-CM

## 2022-09-23 DIAGNOSIS — Z5112 Encounter for antineoplastic immunotherapy: Secondary | ICD-10-CM | POA: Diagnosis not present

## 2022-09-23 DIAGNOSIS — C50111 Malignant neoplasm of central portion of right female breast: Secondary | ICD-10-CM

## 2022-09-23 NOTE — Assessment & Plan Note (Addendum)
#   04/20/2021: Right breast invasive mammary carcinoma s/p mastectomy and SNL, pT1c(m) pN1a, ER low positive, PR negative and HER2 amplified-stage II.  Currently status post  adjuvant TCH-P x6 cycles [last 72/27]; Dr.Chrystal- [s/p  01/28/2022]. Phesgo cycle #1 on 01/18/2022 [Allison Harper]  # s/p adjuvant Phesgo every 3 weeks. [Total of 1 year- until May 2024].  Tolerating well without any major side effects. March 13th, 2024- EF 55 to 60%.  Continue surveillance on a clinical basis.  # 07/13/2021: Status post FNA left lung nodule showing neoplastic cells consistent with low-grade neuroendocrine tumor. 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   PET APRIL 2023- left lower lobe pulmonary nodules measuring up to 13 mm.  MAY 9th, 2024- Mild radiotracer activity associated rounded nodules within the LEFT lung consistent with well differentiated neuroendocrine tumor tumor. No abnormal radiotracer activity in the abdomen pelvis. 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 Probable carcinoid type reaction- ?  Currently none.  Monitor for now   # Neuropathy/myalgias/hypomagnesemia: Grade 1-2 continue compliance with magnesium; exercising- stable.   # weight gain/PCOS/ steroids induced- DM- PP BG- 134.  Patient currently on metformin.  Counseled at length regarding stress elevation/obesity management. stable.   # Left toe swelling/acute gout- ? Sec to phesgo vs others. On allopurinol; will try colchicine short course. Stable.   # port/IV access- Stable; discussed re: pro and cons of keeping the port vs. Explantation.  Interested in having her port taken out.  This could be done when patient is having her lung surgery.   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

## 2022-09-23 NOTE — Progress Notes (Signed)
Radiation Oncology Follow up Note  Name: Allison Harper   Date:   09/23/2022 MRN:  [id number] DOB: [birth year]    This 52 y.o. female presents to the clinic today for 53-month follow-up status post right chest wall and peripheral lymphatic radiation therapy for ER/PR negative HER2/neu overexpressed stage IIa (pT2 N1 M0) invasive mammary carcinoma status post right modified radical mastectomy and limited axillary dissection.Marland Kitchen  REFERRING PROVIDER: No ref. provider found  HPI: Patient is a 52 year old female now out 7 months having completed right chest wall and peripheral lymphatic radiation therapy for stage IIa ER/PR negative HER2/neu overexpressed invasive mammary carcinoma.  Seen today in routine follow-up from a breast standpoint she is doing well specifically denies any chest wall tenderness.  She is recently been found to have a left lung nodule fine-needle aspiration is positive for low-grade neuroendocrine tumor PET scan showed minimal hypermetabolic activity and she is planning on seeing a thoracic surgeon for possible resection.  COMPLICATIONS OF TREATMENT: none  FOLLOW UP COMPLIANCE: keeps appointments   PHYSICAL EXAM:  There were no vitals taken for this visit. Patient is status post right modified radical mastectomy.  Chest wall is clear no evidence of mass or nodularity is noted left breast is free of dominant mass.  No axillary or supraclavicular adenopathy is identified.  Well-developed well-nourished patient in NAD. HEENT reveals PERLA, EOMI, discs not visualized.  Oral cavity is clear. No oral mucosal lesions are identified. Neck is clear without evidence of cervical or supraclavicular adenopathy. Lungs are clear to A&P. Cardiac examination is essentially unremarkable with regular rate and rhythm without murmur rub or thrill. Abdomen is benign with no organomegaly or masses noted. Motor sensory and DTR levels are equal and symmetric in the upper and lower extremities. Cranial nerves  II through XII are grossly intact. Proprioception is intact. No peripheral adenopathy or edema is identified. No motor or sensory levels are noted. Crude visual fields are within normal range.  RADIOLOGY RESULTS: CT and PET scan reviewed compatible with above-stated findings  PLAN: This time I assured the patient that the lung nodule is nothing to do with her breast cancer and should be amenable to resection.  She will have a conversation with thoracic surgery.  From a breast PAM standpoint she is doing well 7 months out with no evidence of disease.  I am pleased with her overall progress.  I have asked to see her back in 6 months for follow-up.  Patient is to call with any concerns.  I would like to take this opportunity to thank you for allowing me to participate in the care of your patient.Carmina Miller, MD

## 2022-09-23 NOTE — Progress Notes (Signed)
[facility] CONSULT NOTE  Patient Care Team: Pcp, No as PCP - General Byrnett, Merrily Pew, MD as Consulting Physician (General Surgery)  CHIEF COMPLAINTS/PURPOSE OF CONSULTATION: breast cancer   Oncology History Overview Note  Final Diagnosis    1.  Lymph node, right axilla, excision:   -One lymph node, negative for carcinoma (0/1).   2. Sentinel lymph node, right axilla #1, excision:   -One lymph node, negative for carcinoma; multiple levels examined (0/1).   3. Sentinel lymph node, right axilla #2, excision:   -One lymph node, negative for carcinoma; multiple levels examined (0/1).   4. Sentinel lymph node, right axilla #3, excision:   -Two lymph nodes, negative for carcinoma; multiple levels examined (0/2).   5.  Lymph node, right axilla, excision:   -One lymph node, positive for carcinoma (1/1). -Metastatic focus measures greater than 3 mm. -No extranodal extension is seen.   6.  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 -Invasive focus measures 12 mm microscopically (measured on slide 6E). 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 -Margins are negative for in situ and invasive carcinoma; closest is posterior at 7 mm and greater than 10 mm respectively. -Usual ductal hyperplasia, cystic apocrine metaplasia, fibroadenomatoid changes and biopsy site changes present. -pT1c (sn)N1a M-not applicable; AJCC stage IIA. -Please see synoptic report below.    # 04/20/2021: [Dr.Rao- ]Right breast invasive mammary carcinoma; declined neoadjuvant chemotherapy.  S/p mastectomy and SNL, pT1c(m) pN1a, ER low positive, PR negative and HER2 amplified-stage II.  Currently status post   adjuvant TCH-P x6 cycles [last 72/27]; Dr.Chrystal- [s/p  01/28/2022]. Phesgo cycle 1 day 1   [01/18/2022]  # OCT 6th, 2023- Phesgo every 3 weeks for total of 11 more cycles. [Total of 1 year].   # 07/13/2021: Status post FNA left lung nodule showing neoplastic cells consistent with low-grade neuroendocrine tumor [[facility]]. 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 Awaiting  PET/CT scan per Dr. Dorena Dew.   # PCOS/DM-sec to steroids- metformin   Malignant neoplasm of central portion of right breast in female, estrogen receptor negative (HCC)  05/11/2021 Cancer Staging   Staging form: Breast, AJCC 8th Edition - Pathologic stage from 05/11/2021: Stage IIA (pT1c, pN1a(sn), cM0, G3, ER-, PR-, HER2+) - Signed by Creig Hines, MD on 05/20/2021 Stage prefix: Initial diagnosis Method of lymph node assessment: Sentinel lymph node biopsy Multigene prognostic tests performed: None Histologic grading system: 3 grade system Residual tumor (R): R0 - None   05/20/2021 Initial Diagnosis   Malignant neoplasm of central portion of right breast in female, estrogen receptor negative (HCC)   08/07/2021 -  Chemotherapy   Patient is on Treatment Plan : BREAST DOCEtaxel + Trastuzumab + Pertuzumab (THP) q21d x 8 cycles / Trastuzumab + Pertuzumab q21d x 4 cycles      HISTORY OF PRESENTING ILLNESS: Ambulating independently.  Accompanied by significant other.   Allison Harper 52 y.o.  female history of ER/PR negative HER2/neu positive  Stage II right breast cancer status post mastectomy currently s/p  adjuvant Traz-Perjeta/Phesgo; history of left lung carcinoid is here for follow-up/reviews of the PET scan.   Patient had a reaction after the Dototate dye.  Resolved by itself.  Denies any worsening shortness of breath or cough.  No fever no chills.  Review of Systems  Constitutional:  Negative for chills, diaphoresis, fever, malaise/fatigue and weight loss.   HENT:  Negative for nosebleeds and sore throat.   Eyes:  Negative for double vision.  Respiratory:  Negative for cough, hemoptysis, sputum production, shortness of breath and wheezing.   Cardiovascular:  Negative for chest pain, palpitations, orthopnea and leg swelling.  Gastrointestinal:  Negative for abdominal pain, blood in stool, constipation, diarrhea, heartburn, melena, nausea and vomiting.  Genitourinary:  Negative for dysuria, frequency and urgency.  Musculoskeletal:  Positive for joint pain and myalgias. Negative for back pain.  Skin: Negative.  Negative for itching and rash.  Neurological:  Negative for dizziness, tingling, focal weakness, weakness and headaches.  Endo/Heme/Allergies:  Does not bruise/bleed easily.  Psychiatric/Behavioral:  Negative for depression. The patient is not nervous/anxious and does not have insomnia.     MEDICAL HISTORY:  Past Medical History:  Diagnosis Date   Breast cancer (HCC)    Breast screening, unspecified 05/06/2010   Obesity 05/06/2010   Other benign neoplasm of connective and other soft tissue of trunk, unspecified 05/06/2010    SURGICAL HISTORY: Past Surgical History:  Procedure Laterality Date   CHOLECYSTECTOMY     CYST EXCISION  2008   from back   LIPOMA EXCISION Left 09/2010   flank   TONSILLECTOMY      SOCIAL HISTORY: Social History   Socioeconomic History   Marital status: Single    Spouse name: Not on file   Number of children: Not on file   Years of education: Not on file   Highest education level: Not on file  Occupational History   Not on file  Tobacco Use   Smoking status: Never   Smokeless tobacco: Not on file  Substance and Sexual Activity   Alcohol use: Yes    Comment: ocassionally, 1-2 per month   Drug use: No   Sexual activity: Not on file  Other Topics Concern   Not on file  Social History Narrative   Not on file   Social Determinants of Health   Financial Resource Strain: Not on file  Food  Insecurity: Not on file  Transportation Needs: Not on file  Physical Activity: Not on file  Stress: Not on file  Social Connections: Not on file  Intimate Partner Violence: Not on file    FAMILY HISTORY: Family History  Problem Relation Age of Onset   Breast cancer Neg Hx     ALLERGIES:  is allergic to hydrocodone-acetaminophen and copper cu 64 dotatate.  MEDICATIONS:  Current Outpatient Medications  Medication Sig Dispense Refill   allopurinol (ZYLOPRIM) 100 MG tablet Take 100 mg by mouth daily.     colchicine 0.6 MG tablet Take 1 tablet (0.6 mg total) by mouth daily. 7 tablet 0   lidocaine-prilocaine (EMLA) cream SMARTSIG:1 Topical Daily     Magnesium Cl-Calcium Carbonate (SLOW MAGNESIUM/CALCIUM) 70-117 MG TBEC Take 1 tablet by mouth 2 (two) times daily. 60 tablet 6   metFORMIN (GLUCOPHAGE) 500 MG tablet Take by mouth.     Multiple Vitamins-Minerals (MULTIVITAMIN WITH MINERALS) tablet Take 1 tablet by mouth daily.     LORazepam (ATIVAN) 0.5 MG tablet Take 0.5 mg by mouth daily as needed. (Patient not taking: Reported on 01/30/2022)     No current facility-administered medications for this visit.   Facility-Administered Medications Ordered in Other Visits  Medication Dose Route Frequency Provider Last Rate Last Admin  heparin lock flush 100 UNIT/ML injection            Right mastectomy-well-healing.  Erythema noted from radiation.  No disclamation noted.  PHYSICAL EXAMINATION: ECOG PERFORMANCE STATUS: 1 - Symptomatic but completely ambulatory  Vitals:   09/23/22 0856  BP: (!) 149/63  Pulse: 84  Temp: (!) 97.5 F (36.4 C)  SpO2: 100%   Filed Weights   09/23/22 0856  Weight: 256 lb (116.1 kg)    Physical Exam Vitals and nursing note reviewed.  HENT:     Head: Normocephalic and atraumatic.     Mouth/Throat:     Pharynx: Oropharynx is clear.  Eyes:     Extraocular Movements: Extraocular movements intact.     Pupils: Pupils are equal, round, and reactive to  light.  Cardiovascular:     Rate and Rhythm: Normal rate and regular rhythm.  Pulmonary:     Breath sounds: Normal breath sounds.  Abdominal:     Palpations: Abdomen is soft.  Musculoskeletal:        General: Normal range of motion.     Cervical back: Normal range of motion.  Skin:    General: Skin is warm.  Neurological:     General: No focal deficit present.     Mental Status: She is alert and oriented to person, place, and time.  Psychiatric:        Behavior: Behavior normal.        Judgment: Judgment normal.    LABORATORY DATA:  I have reviewed the data as listed Lab Results  Component Value Date   WBC 7.2 07/26/2022   HGB 12.8 07/26/2022   HCT 40.1 07/26/2022   MCV 88.1 07/26/2022   PLT 288 07/26/2022   Recent Labs    05/03/22 1316 06/14/22 0816 07/26/22 1104  NA 138 135 135  K 4.2 3.7 4.1  CL 102 100 103  CO2 26 25 25   GLUCOSE 158* 219* 132*  BUN 12 12 16   CREATININE 0.79 0.67 0.64  CALCIUM 9.3 8.7* 9.0  GFRNONAA >60 >60 >60  PROT 7.4 7.0 7.9  ALBUMIN 4.0 3.8 4.4  AST 20 20 20   ALT 13 13 16   ALKPHOS 76 75 72  BILITOT 0.4 0.5 0.5    RADIOGRAPHIC STUDIES: I have personally reviewed the radiological images as listed and agreed with the findings in the report. NM PET DOTATATE SKULL BASE TO MID THIGH  Result Date: 09/12/2022 CLINICAL DATA:  Lung nodules biopsy with resulting evidence of low-grade neuroendocrine tumor. EXAM: NUCLEAR MEDICINE PET SKULL BASE TO THIGH TECHNIQUE: 4.2 mCi copper 64 DOTATATE was injected intravenously. Full-ring PET imaging was performed from the skull base to thigh after the radiotracer. CT data was obtained and used for attenuation correction and anatomic localization. COMPARISON:  None Available. FINDINGS: Liver activity equal 10.6. NECK No radiotracer activity in neck lymph nodes. Incidental CT findings: None CHEST Several rounded nodule in LEFT lung. Nodules have faint radiotracer activity. Example 13 mm nodule in the LEFT lower  lobe on image 47 with SUV max equal 2.6. Nodule in the superior segment LEFT lower lobe measuring 9 mm (image 40) with SUV max equal 2.8. No radiotracer avid mediastinal lymph nodes. Intense radiotracer activity noted throughout the thyroid gland is favored physiologic. Incidental CT finding:Port in the anterior chest wall with tip in distal SVC. ABDOMEN/PELVIS No abnormal radiotracer activity in the abdomen or pelvis. No abnormal radiotracer activity associated with the bowel or lymph nodes. Physiologic activity noted in the liver,  spleen, adrenal glands and kidneys. Incidental CT findings:None SKELETON No focal activity to suggest skeletal metastasis. Incidental CT findings:None IMPRESSION: 1. Mild radiotracer activity associated rounded nodules within the LEFT lung consistent with well differentiated neuroendocrine tumor tumor. Activity is relatively low (significantly less than liver activity) which is not uncommon for bronchogenic carcinoid. 2. No abnormal radiotracer activity in the abdomen pelvis. 3. Of note patient had a reaction upon administration DOTATATE radiotracer. Probable carcinoid type reaction. Electronically Signed   By: Genevive Bi M.D.   On: 09/12/2022 14:00     Malignant neoplasm of central portion of right breast in female, estrogen receptor negative (HCC) # 04/20/2021: Right breast invasive mammary carcinoma s/p mastectomy and SNL, pT1c(m) pN1a, ER low positive, PR negative and HER2 amplified-stage II.  Currently status post  adjuvant TCH-P x6 cycles [last 72/27]; Dr.Chrystal- [s/p  01/28/2022]. Phesgo cycle #1 on 01/18/2022 [[place]]  # s/p adjuvant Phesgo every 3 weeks. [Total of 1 year- until May 2024].  Tolerating well without any major side effects. March 13th, 2024- EF 55 to 60%.  Continue surveillance on a clinical basis.  # 07/13/2021: Status post FNA left lung nodule showing neoplastic cells consistent with low-grade neuroendocrine tumor. Low-grade neuroendocrine tumor of  the left lung: s/p prior evaluation with pulmonary [Dr.Singh; [place]] and thoracic surgery .   PET APRIL 2023- left lower lobe pulmonary nodules measuring up to 13 mm.  MAY 9th, 2024- Mild radiotracer activity associated rounded nodules within the LEFT lung consistent with well differentiated neuroendocrine tumor tumor. No abnormal radiotracer activity in the abdomen pelvis. 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 Probable carcinoid type reaction- ?  Currently none.  Monitor for now   # Neuropathy/myalgias/hypomagnesemia: Grade 1-2 continue compliance with magnesium; exercising- stable.   # weight gain/PCOS/ steroids induced- DM- PP BG- 134.  Patient currently on metformin.  Counseled at length regarding stress elevation/obesity management. stable.   # Left toe swelling/acute gout- ? Sec to phesgo vs others. On allopurinol; will try colchicine short course. Stable.   # port/IV access- Stable; discussed re: pro and cons of keeping the port vs. Explantation.  Interested in having her port taken out.  This could be done when patient is having her lung surgery.   # DISPOSITION:   # referral to Dr.Hendrickson re: left lung carcinoid # follow up in 3 months- MD; labs- cbc/cmp/mag- Dr.B  # I reviewed the blood work- with the patient in detail; also reviewed the imaging independently [as summarized above]; and with the patient in detail.    Above plan of care was discussed with patient/family in detail.  My contact information was given to the patient/family.     Earna Coder, MD 09/23/2022 9:40 AM

## 2022-09-24 ENCOUNTER — Other Ambulatory Visit: Payer: Self-pay

## 2022-09-24 ENCOUNTER — Telehealth: Payer: Self-pay | Admitting: *Deleted

## 2022-09-24 ENCOUNTER — Other Ambulatory Visit: Payer: Self-pay | Admitting: *Deleted

## 2022-09-24 DIAGNOSIS — C7A090 Malignant carcinoid tumor of the bronchus and lung: Secondary | ICD-10-CM

## 2022-09-24 NOTE — Telephone Encounter (Signed)
Received faxed refill requests from [facility] for zofran, compazine and Dukes mouthwash. Signed by MD and faxed back.

## 2022-09-24 NOTE — Telephone Encounter (Signed)
Received request from Dr Hendrickson's office to order PFT's for this patient. They have scheduled her appt on 10/09/22. Orders placed in epic. I reached out to Damita Lack to schedule.

## 2022-09-27 ENCOUNTER — Ambulatory Visit: Payer: 59 | Admitting: Internal Medicine

## 2022-10-08 ENCOUNTER — Ambulatory Visit: Payer: 59

## 2022-10-08 ENCOUNTER — Ambulatory Visit: Payer: 59 | Attending: Internal Medicine

## 2022-10-08 DIAGNOSIS — R942 Abnormal results of pulmonary function studies: Secondary | ICD-10-CM | POA: Insufficient documentation

## 2022-10-08 DIAGNOSIS — C7A090 Malignant carcinoid tumor of the bronchus and lung: Secondary | ICD-10-CM | POA: Insufficient documentation

## 2022-10-08 LAB — PULMONARY FUNCTION TEST ARMC ONLY
DL/VA % pred: 114 %
DL/VA: 4.72 ml/min/mmHg/L
DLCO unc % pred: 78 %
DLCO unc: 19.32 ml/min/mmHg
FEF 25-75 Post: 3.9 L/sec
FEF 25-75 Pre: 2.41 L/sec
FEF2575-%Change-Post: 61 %
FEF2575-%Pred-Post: 132 %
FEF2575-%Pred-Pre: 81 %
FEV1-%Change-Post: 10 %
FEV1-%Pred-Post: 78 %
FEV1-%Pred-Pre: 71 %
FEV1-Post: 2.54 L
FEV1-Pre: 2.29 L
FEV1FVC-%Change-Post: 8 %
FEV1FVC-%Pred-Pre: 102 %
FEV6-%Change-Post: 2 %
FEV6-%Pred-Post: 72 %
FEV6-%Pred-Pre: 70 %
FEV6-Post: 2.89 L
FEV6-Pre: 2.82 L
FEV6FVC-%Pred-Post: 102 %
FEV6FVC-%Pred-Pre: 102 %
FVC-%Change-Post: 2 %
FVC-%Pred-Post: 70 %
FVC-%Pred-Pre: 68 %
FVC-Post: 2.89 L
FVC-Pre: 2.82 L
Post FEV1/FVC ratio: 88 %
Post FEV6/FVC ratio: 100 %
Pre FEV1/FVC ratio: 81 %
Pre FEV6/FVC Ratio: 100 %
RV % pred: 34 %
RV: 0.72 L
TLC % pred: 73 %
TLC: 4.29 L

## 2022-10-08 MED ORDER — ALBUTEROL SULFATE (2.5 MG/3ML) 0.083% IN NEBU
2.5000 mg | INHALATION_SOLUTION | Freq: Once | RESPIRATORY_TRACT | Status: AC
  Administered 2022-10-08: 2.5 mg via RESPIRATORY_TRACT
  Filled 2022-10-08: qty 3

## 2022-10-09 ENCOUNTER — Encounter: Payer: Self-pay | Admitting: Thoracic Surgery (Cardiothoracic Vascular Surgery)

## 2022-10-09 ENCOUNTER — Institutional Professional Consult (permissible substitution): Payer: 59 | Admitting: Thoracic Surgery (Cardiothoracic Vascular Surgery)

## 2022-10-09 ENCOUNTER — Other Ambulatory Visit: Payer: Self-pay | Admitting: Thoracic Surgery (Cardiothoracic Vascular Surgery)

## 2022-10-09 VITALS — BP 147/86 | HR 82 | Resp 18 | Ht 69.0 in | Wt 255.0 lb

## 2022-10-09 DIAGNOSIS — D3A090 Benign carcinoid tumor of the bronchus and lung: Secondary | ICD-10-CM

## 2022-10-09 NOTE — Progress Notes (Signed)
PCP is Pcp, No Referring Provider is Earna Coder, *  Chief Complaint  Patient presents with   Consult    PET dotatate 5/9, PFTs 6/4    HPI: Ms. Amirian is sent for consultation regarding a carcinoid tumors of the left lower lobe.  Aanisah Trimble is a 52 year old woman with a history of breast cancer, peripheral neuropathy, and carcinoid tumors of the lung.  She was diagnosed with breast cancer in 2023.  During her workup she had a CT of the chest.  That was done at [facility].  The images were not available but the report noted to left lower lobe nodules measuring 1 cm and 1 x 1.3 cm respectively.  There were other smaller nodules present as well.  Nodules were not hypermetabolic on FDG PET.  She had bronchoscopy and biopsy which showed low-grade carcinoid tumor.  She completed therapy for her breast cancer.  She recently had a dotatate PET which showed low-grade activity in the left lower lobe lung nodules.  There was no significant change in size of the nodules that was compared to the report although I do not have the images to compare side-by-side.  She is feeling well she   Past Medical History:  Diagnosis Date   Breast cancer (HCC)    Breast screening, unspecified 05/06/2010   Obesity 05/06/2010   Other benign neoplasm of connective and other soft tissue of trunk, unspecified 05/06/2010    Past Surgical History:  Procedure Laterality Date   CHOLECYSTECTOMY     CYST EXCISION  2008   from back   LIPOMA EXCISION Left 09/2010   flank   TONSILLECTOMY      Family History  Problem Relation Age of Onset   Breast cancer Neg Hx     Social History Social History   Tobacco Use   Smoking status: Never  Substance Use Topics   Alcohol use: Yes    Comment: ocassionally, 1-2 per month   Drug use: No    Current Outpatient Medications  Medication Sig Dispense Refill   allopurinol (ZYLOPRIM) 100 MG tablet Take 100 mg by mouth daily.     Magnesium Cl-Calcium Carbonate (SLOW  MAGNESIUM/CALCIUM) 70-117 MG TBEC Take 1 tablet by mouth 2 (two) times daily. 60 tablet 6   metFORMIN (GLUCOPHAGE) 500 MG tablet Take by mouth.     Multiple Vitamins-Minerals (MULTIVITAMIN WITH MINERALS) tablet Take 1 tablet by mouth daily.     colchicine 0.6 MG tablet Take 1 tablet (0.6 mg total) by mouth daily. (Patient not taking: Reported on 10/09/2022) 7 tablet 0   No current facility-administered medications for this visit.   Facility-Administered Medications Ordered in Other Visits  Medication Dose Route Frequency Provider Last Rate Last Admin   heparin lock flush 100 UNIT/ML injection             Allergies  Allergen Reactions   Hydrocodone-Acetaminophen Itching and Rash   Copper Cu 64 Dotatate Diarrhea, Nausea Only, Rash and Other (See Comments)    Chest pain    Review of Systems  Constitutional:  Negative for activity change, fatigue and unexpected weight change.  HENT:  Negative for trouble swallowing and voice change.   Respiratory:  Positive for shortness of breath (With exertion, no recent change). Negative for cough and wheezing.   Cardiovascular:  Negative for chest pain.  Genitourinary:  Negative for difficulty urinating and dysuria.  Musculoskeletal:  Negative for arthralgias and myalgias.  Neurological:  Positive for numbness. Negative for seizures and syncope.  Hematological:  Negative for adenopathy. Does not bruise/bleed easily.  All other systems reviewed and are negative.   BP (!) 147/86 (BP Location: Left Arm, Patient Position: Sitting)   Pulse 82   Resp 18   Ht 5\' 9"  (1.753 m)   Wt 255 lb (115.7 kg)   SpO2 95% Comment: RA  BMI 37.66 kg/m  Physical Exam Vitals reviewed.  Constitutional:      Appearance: She is obese.  HENT:     Head: Normocephalic and atraumatic.  Eyes:     General: No scleral icterus. Cardiovascular:     Rate and Rhythm: Normal rate and regular rhythm.     Heart sounds: Murmur (2/6 systolic) heard.  Pulmonary:     Effort:  Pulmonary effort is normal. No respiratory distress.     Breath sounds: No wheezing.     Comments: Diminished breath sounds left base Abdominal:     General: There is no distension.     Palpations: Abdomen is soft.  Skin:    General: Skin is warm and dry.     Comments: Status post right mastectomy  Neurological:     General: No focal deficit present.     Mental Status: She is alert and oriented to person, place, and time.     Cranial Nerves: No cranial nerve deficit.     Motor: No weakness.    Diagnostic Tests: I reviewed the PET dotatate images which were available in our PACS system but not connected to this epic module.  1 x 1 cm and 1 x 1.3 cm left lower lobe lung nodules.  Mild uptake with dotatate.  Impression: Hartlyn Radaker is a 52 year old woman with a history of breast cancer, peripheral neuropathy, and carcinoid tumors of the lung.    Carcinoid tumors left lower lobe-has 2 carcinoid tumors 1 in the superior segment and 1 more basilar.  I explained to her that the treatment of these is surgical resection.  In her case I would favor lobectomy.  She has other smaller nodules as well.  I emphasized that these nodules had not changed significantly over the past year so there was no rush and she could think over her options.  We discussed the proposed operative procedure which would be a robotic assisted left lower lobectomy.  I informed her of the general nature of the procedure including the need for general anesthesia, the incisions to use, the use of the surgical robot, the use of a drainage tube postoperatively, the expected hospital stay, and the overall recovery.  I informed her of the indications, risks, benefits, and alternatives.  She understands the risks include, but not limited to death, MI, DVT, PE, bleeding, possible need for transfusion, infection, prolonged air leak, cardiac arrhythmias, as well as the possibility of other unforeseeable complications.  Elevated left  hemidiaphragm-not mentioned on imaging reports but is very obvious looking at the films.  Will get a sniff test to see if that is just eventration or if the diaphragm is in fact paralyzed.  Likely will need plication at the time of her lobectomy.  Plan: Sniff test to see if diaphragm is functional Robotic left VATS for left lower lobectomy and possible diaphragm plication She will call to schedule surgery  Loreli Slot, MD [facility] [phone]

## 2022-10-10 ENCOUNTER — Other Ambulatory Visit: Payer: Self-pay | Admitting: Thoracic Surgery (Cardiothoracic Vascular Surgery)

## 2022-10-10 DIAGNOSIS — D3A090 Benign carcinoid tumor of the bronchus and lung: Secondary | ICD-10-CM

## 2022-10-14 ENCOUNTER — Inpatient Hospital Stay: Admission: RE | Admit: 2022-10-14 | Payer: 59 | Source: Ambulatory Visit

## 2022-10-17 ENCOUNTER — Ambulatory Visit: Payer: 59

## 2022-10-18 ENCOUNTER — Other Ambulatory Visit: Payer: 59

## 2022-10-25 ENCOUNTER — Ambulatory Visit
Admission: RE | Admit: 2022-10-25 | Discharge: 2022-10-25 | Disposition: A | Discharge status: Home / Self Care | Payer: 59 | Source: Ambulatory Visit | Attending: Thoracic Surgery (Cardiothoracic Vascular Surgery) | Admitting: Thoracic Surgery (Cardiothoracic Vascular Surgery)

## 2022-10-25 DIAGNOSIS — D3A090 Benign carcinoid tumor of the bronchus and lung: Secondary | ICD-10-CM

## 2022-12-21 ENCOUNTER — Encounter: Payer: Self-pay | Admitting: Internal Medicine

## 2022-12-23 DIAGNOSIS — D3A090 Benign carcinoid tumor of the bronchus and lung: Secondary | ICD-10-CM | POA: Insufficient documentation

## 2022-12-24 ENCOUNTER — Inpatient Hospital Stay: Payer: 59

## 2022-12-24 ENCOUNTER — Inpatient Hospital Stay: Payer: 59 | Admitting: Internal Medicine

## 2022-12-24 ENCOUNTER — Encounter: Payer: Self-pay | Admitting: *Deleted

## 2022-12-24 ENCOUNTER — Other Ambulatory Visit: Payer: Self-pay | Admitting: *Deleted

## 2022-12-24 ENCOUNTER — Other Ambulatory Visit: Payer: Self-pay | Admitting: Thoracic Surgery (Cardiothoracic Vascular Surgery)

## 2022-12-24 DIAGNOSIS — D3A090 Benign carcinoid tumor of the bronchus and lung: Secondary | ICD-10-CM

## 2023-01-02 ENCOUNTER — Inpatient Hospital Stay: Payer: 59 | Attending: Internal Medicine | Admitting: Internal Medicine

## 2023-01-02 ENCOUNTER — Inpatient Hospital Stay: Payer: 59

## 2023-01-02 ENCOUNTER — Encounter: Payer: Self-pay | Admitting: Internal Medicine

## 2023-01-02 DIAGNOSIS — Z853 Personal history of malignant neoplasm of breast: Secondary | ICD-10-CM | POA: Diagnosis present

## 2023-01-02 DIAGNOSIS — C50111 Malignant neoplasm of central portion of right female breast: Secondary | ICD-10-CM

## 2023-01-02 DIAGNOSIS — C7A090 Malignant carcinoid tumor of the bronchus and lung: Secondary | ICD-10-CM | POA: Insufficient documentation

## 2023-01-02 DIAGNOSIS — Z171 Estrogen receptor negative status [ER-]: Secondary | ICD-10-CM

## 2023-01-02 LAB — CMP (CANCER CENTER ONLY)
ALT: 18 U/L (ref 0–44)
AST: 20 U/L (ref 15–41)
Albumin: 4.2 g/dL (ref 3.5–5.0)
Alkaline Phosphatase: 73 U/L (ref 38–126)
Anion gap: 8 (ref 5–15)
BUN: 15 mg/dL (ref 6–20)
CO2: 24 mmol/L (ref 22–32)
Calcium: 9.1 mg/dL (ref 8.9–10.3)
Chloride: 104 mmol/L (ref 98–111)
Creatinine: 0.7 mg/dL (ref 0.44–1.00)
GFR, Estimated: >60 mL/min (ref >60)
Glucose, Bld: 239 mg/dL — ABNORMAL HIGH (ref 70–99)
Potassium: 3.6 mmol/L (ref 3.5–5.1)
Sodium: 136 mmol/L (ref 135–145)
Total Bilirubin: 0.5 mg/dL (ref 0.3–1.2)
Total Protein: 7.2 g/dL (ref 6.5–8.1)

## 2023-01-02 LAB — CBC WITH DIFFERENTIAL (CANCER CENTER ONLY)
Abs Immature Granulocytes: 0.03 10*3/uL (ref 0.00–0.07)
Basophils Absolute: 0.1 10*3/uL (ref 0.0–0.1)
Basophils Relative: 1 %
Eosinophils Absolute: 0.3 10*3/uL (ref 0.0–0.5)
Eosinophils Relative: 4 %
HCT: 37.8 % (ref 36.0–46.0)
Hemoglobin: 12.2 g/dL (ref 12.0–15.0)
Immature Granulocytes: 0 %
Lymphocytes Relative: 34 %
Lymphs Abs: 2.4 10*3/uL (ref 0.7–4.0)
MCH: 27.7 pg (ref 26.0–34.0)
MCHC: 32.3 g/dL (ref 30.0–36.0)
MCV: 85.9 fL (ref 80.0–100.0)
Monocytes Absolute: 0.4 10*3/uL (ref 0.1–1.0)
Monocytes Relative: 6 %
Neutro Abs: 3.7 10*3/uL (ref 1.7–7.7)
Neutrophils Relative %: 55 %
Platelet Count: 245 10*3/uL (ref 150–400)
RBC: 4.4 MIL/uL (ref 3.87–5.11)
RDW: 13.4 % (ref 11.5–15.5)
WBC Count: 6.9 10*3/uL (ref 4.0–10.5)
nRBC: 0 % (ref 0.0–0.2)

## 2023-01-02 LAB — MAGNESIUM: Magnesium: 1.8 mg/dL (ref 1.7–2.4)

## 2023-01-02 MED ORDER — HEPARIN SOD (PORK) LOCK FLUSH 100 UNIT/ML IV SOLN
500.0000 [IU] | Freq: Once | INTRAVENOUS | Status: AC
  Administered 2023-01-02: 500 [IU] via INTRAVENOUS
  Filled 2023-01-02: qty 5

## 2023-01-02 NOTE — Progress Notes (Signed)
No concerns for the provider.

## 2023-01-02 NOTE — Assessment & Plan Note (Addendum)
#   04/20/2021: Right breast invasive mammary carcinoma s/p mastectomy and SNL, pT1c(m) pN1a, ER low positive, PR negative and HER2 amplified-stage II.  status post  adjuvant TCH-P x6 cycles [last 72/27]; Dr.Chrystal- [s/p  01/28/2022]. Phesgo cycle #1 on 01/18/2022 [[place]]; s/p adjuvant Phesgo every 3 weeks. [Total of 1 year- until May 2024].  March 13th, 2024- EF 55 to 60%. Intact uterus.   # No clinical evidence of recurrence. Continue surveillance on a clinical basis.Recommend UNI LAT LEFT screening Mammogram. # FEB 2023- LMP [chemo induced]- check estrogen/LH/FSH. #Discussed the role of anti-hormonal therapy mechanism of action; since patient is premenopausal recommend tamoxifen.  I would recommend tamoxifen for 5 years.  Long discussion regarding the potential adverse events on tamoxifen including but not limited to hot flashes, mood swings, thromboembolic events strokes and also small risk of uterine cancers. Vs- AI. Await Hormones levels.   # 07/13/2021: Lung carcinoid [ status post FNA left lung nodule  s/p prior evaluation with pulmonary [Dr.Singh; [place]] and thoracic surgery . MAY 9th, 2024- Mild radiotracer activity associated rounded nodules within the LEFT lung consistent with well differentiated neuroendocrine tumor tumor status post evaluation with Dr.Hendrickson re: left lung carcinoid.  Awaiting repeat imaging in September 2024. Awaiting surgery in sep, 2024.    # Neuropathy/myalgias/hypomagnesemia: Grade 1-2 continue compliance with magnesium; exercising- stable.   # weight gain/PCOS/ steroids induced- DM- PP BG- 134.  Patient currently on metformin.  Counseled at length regarding stress elevation/obesity management. stable.   # Left toe swelling/acute gout- ? Sec to phesgo vs others. On allopurinol; will try colchicine short course. Stable.   # port/IV access- Stable; discussed re: pro and cons of keeping the port vs. Explantation.  Interested in having her port taken out.  Will  refer to IR locally for explanation at next visit.    /Reaction after the Dototate dye # DISPOSITION:   # UNI LAT LEFT screening # follow up in 3 months- MD; labs- cbc/cmp/mag; estrogen/LH/ FSH. - Dr.B

## 2023-01-02 NOTE — Progress Notes (Signed)
[facility] CONSULT NOTE  Patient Care Team: Pcp, No as PCP - General Byrnett, Merrily Pew, MD as Consulting Physician (General Surgery) Earna Coder, MD as Consulting Physician (Oncology)  CHIEF COMPLAINTS/PURPOSE OF CONSULTATION: breast cancer   Oncology History Overview Note  Final Diagnosis    1.  Lymph node, right axilla, excision:   -One lymph node, negative for carcinoma (0/1).   2. Sentinel lymph node, right axilla #1, excision:   -One lymph node, negative for carcinoma; multiple levels examined (0/1).   3. Sentinel lymph node, right axilla #2, excision:   -One lymph node, negative for carcinoma; multiple levels examined (0/1).   4. Sentinel lymph node, right axilla #3, excision:   -Two lymph nodes, negative for carcinoma; multiple levels examined (0/2).   5.  Lymph node, right axilla, excision:   -One lymph node, positive for carcinoma (1/1). -Metastatic focus measures greater than 3 mm. -No extranodal extension is seen.   6.  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 -Invasive focus measures 12 mm microscopically (measured on slide 6E). 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 -Margins are negative for in situ and invasive carcinoma; closest is posterior at 7 mm and greater than 10 mm respectively. -Usual ductal hyperplasia, cystic apocrine metaplasia, fibroadenomatoid changes and biopsy site changes present. -pT1c (sn)N1a M-not applicable; AJCC stage IIA. -Please see synoptic report below.    # 04/20/2021: [Dr.Rao- ]Right breast invasive mammary carcinoma; declined neoadjuvant chemotherapy.  S/p mastectomy and SNL, pT1c(m) pN1a, ER low positive, PR  negative and HER2 amplified-stage II.  Currently status post  adjuvant TCH-P x6 cycles [last 72/27]; Dr.Chrystal- [s/p  01/28/2022]. Phesgo cycle 1 day 1   [01/18/2022]  # OCT 6th, 2023- Phesgo every 3 weeks for total of 11 more cycles. [Total of 1 year].   # 07/13/2021: Status post FNA left lung nodule showing neoplastic cells consistent with low-grade neuroendocrine tumor [[facility]]. 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 Awaiting  PET/CT scan per Dr. Dorena Dew.   # PCOS/DM-sec to steroids- metformin   Malignant neoplasm of central portion of right breast in female, estrogen receptor negative (HCC)  05/11/2021 Cancer Staging   Staging form: Breast, AJCC 8th Edition - Pathologic stage from 05/11/2021: Stage IIA (pT1c, pN1a(sn), cM0, G3, ER-, PR-, HER2+) - Signed by Creig Hines, MD on 05/20/2021 Stage prefix: Initial diagnosis Method of lymph node assessment: Sentinel lymph node biopsy Multigene prognostic tests performed: None Histologic grading system: 3 grade system Residual tumor (R): R0 - None   05/20/2021 Initial Diagnosis   Malignant neoplasm of central portion of right breast in female, estrogen receptor negative (HCC)   08/07/2021 -  Chemotherapy   Patient is on Treatment Plan : BREAST DOCEtaxel + Trastuzumab + Pertuzumab (THP) q21d x 8 cycles / Trastuzumab + Pertuzumab q21d x 4 cycles      HISTORY OF PRESENTING ILLNESS: Ambulating independently.  Accompanied by significant other.   Allison Harper 52 y.o.  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  In the interim patient was evaluated by thoracic surgery  for consideration of surgery of her left lung carcinoid.  Denies any worsening shortness of breath or cough.  No fever no chills.  Review of Systems   Constitutional:  Negative for chills, diaphoresis, fever, malaise/fatigue and weight loss.  HENT:  Negative for nosebleeds and sore throat.   Eyes:  Negative for double vision.  Respiratory:  Negative for cough, hemoptysis, sputum production, shortness of breath and wheezing.   Cardiovascular:  Negative for chest pain, palpitations, orthopnea and leg swelling.  Gastrointestinal:  Negative for abdominal pain, blood in stool, constipation, diarrhea, heartburn, melena, nausea and vomiting.  Genitourinary:  Negative for dysuria, frequency and urgency.  Musculoskeletal:  Positive for joint pain and myalgias. Negative for back pain.  Skin: Negative.  Negative for itching and rash.  Neurological:  Negative for dizziness, tingling, focal weakness, weakness and headaches.  Endo/Heme/Allergies:  Does not bruise/bleed easily.  Psychiatric/Behavioral:  Negative for depression. The patient is not nervous/anxious and does not have insomnia.     MEDICAL HISTORY:  Past Medical History:  Diagnosis Date   Breast cancer (HCC)    Breast screening, unspecified 05/06/2010   Obesity 05/06/2010   Other benign neoplasm of connective and other soft tissue of trunk, unspecified 05/06/2010    SURGICAL HISTORY: Past Surgical History:  Procedure Laterality Date   CHOLECYSTECTOMY     CYST EXCISION  2008   from back   LIPOMA EXCISION Left 09/2010   flank   TONSILLECTOMY      SOCIAL HISTORY: Social History   Socioeconomic History   Marital status: Single    Spouse name: Not on file   Number of children: Not on file   Years of education: Not on file   Highest education level: Not on file  Occupational History   Not on file  Tobacco Use   Smoking status: Never   Smokeless tobacco: Not on file  Substance and Sexual Activity   Alcohol use: Yes    Comment: ocassionally, 1-2 per month   Drug use: No   Sexual activity: Not on file  Other Topics Concern   Not on file  Social History Narrative   Not  on file   Social Determinants of Health   Financial Resource Strain: Not on file  Food Insecurity: No Food Insecurity (05/08/2021)   Received from [facility], [facility]   Hunger Vital Sign    Worried About Running Out of Food in the Last Year: Never true    Ran Out of Food in the Last Year: Never true  Transportation Needs: Not on file  Physical Activity: Not on file  Stress: Stress Concern Present (05/21/2021)   Received from [facility], [facility]   [employer] [facility]ational [facility] - Occupational Stress Questionnaire    Feeling of Stress : To some extent  Social Connections: Unknown (09/03/2021)   Received from [facility], [facility]   Social Network    Social Network: Not on file  Intimate Partner Violence: Unknown (08/10/2021)   Received from [facility], [facility]   HITS    Physically Hurt: Not on file    Insult or Talk Down To: Not on file    Threaten Physical Harm: Not on file    Scream or Curse: Not on file    FAMILY HISTORY: Family History  Problem Relation Age of Onset   Breast cancer Neg Hx     ALLERGIES:  is allergic to hydrocodone-acetaminophen and copper cu 64 dotatate.  MEDICATIONS:  Current Outpatient Medications  Medication Sig Dispense Refill   allopurinol (ZYLOPRIM) 100 MG tablet Take 100 mg by mouth daily.     colchicine 0.6 MG tablet Take 1 tablet (0.6 mg total) by mouth daily. 7 tablet 0   Magnesium Cl-Calcium Carbonate (SLOW MAGNESIUM/CALCIUM) 70-117 MG TBEC Take 1 tablet by mouth 2 (two) times daily. 60 tablet 6   metFORMIN (GLUCOPHAGE) 500 MG tablet Take by mouth.     Multiple Vitamins-Minerals (MULTIVITAMIN WITH MINERALS) tablet Take 1 tablet by mouth daily.     No current facility-administered medications for this visit.   Facility-Administered Medications Ordered in Other Visits  Medication Dose Route Frequency Provider Last Rate Last Admin   heparin lock flush 100 UNIT/ML injection            Right  mastectomy-well-healing.  Erythema noted from radiation.  No disclamation noted.  PHYSICAL EXAMINATION: ECOG PERFORMANCE STATUS: 1 - Symptomatic but completely ambulatory  Vitals:   01/02/23 0844  BP: (!) 150/88  Pulse: 80  Resp: 18  Temp: 97.7 F (36.5 C)  SpO2: 97%    Filed Weights   01/02/23 0844  Weight: 285 lb 12.8 oz (129.6 kg)    Physical Exam Vitals and nursing note reviewed.  HENT:     Head: Normocephalic and atraumatic.     Mouth/Throat:     Pharynx: Oropharynx is clear.  Eyes:     Extraocular Movements: Extraocular movements intact.     Pupils: Pupils are equal, round, and reactive to light.  Cardiovascular:     Rate and Rhythm: Normal rate and regular rhythm.  Pulmonary:     Breath sounds: Normal breath sounds.  Abdominal:     Palpations: Abdomen is soft.  Musculoskeletal:        General: Normal range of motion.     Cervical back: Normal range of motion.  Skin:    General: Skin is warm.  Neurological:     General: No focal deficit present.     Mental Status: She is alert and oriented to person, place, and time.  Psychiatric:        Behavior: Behavior normal.        Judgment: Judgment normal.    LABORATORY DATA:  I have reviewed the data as listed Lab Results  Component Value Date   WBC 6.9 01/02/2023   HGB 12.2 01/02/2023   HCT 37.8 01/02/2023   MCV 85.9 01/02/2023   PLT 245 01/02/2023   Recent Labs    06/14/22 0816 07/26/22 1104 01/02/23 0824  NA 135 135 136  K 3.7 4.1 3.6  CL 100 103 104  CO2 25 25 24   GLUCOSE 219* 132* 239*  BUN 12 16 15   CREATININE 0.67 0.64 0.70  CALCIUM 8.7* 9.0 9.1  GFRNONAA >60 >60 >60  PROT 7.0 7.9 7.2  ALBUMIN 3.8 4.4 4.2  AST 20 20 20   ALT 13 16 18   ALKPHOS 75 72 73  BILITOT 0.5 0.5 0.5    RADIOGRAPHIC STUDIES: I have personally reviewed the radiological images as listed and agreed with the findings in the report. No results found.   Malignant neoplasm of central portion of right breast in  female, estrogen receptor negative (HCC) # 04/20/2021: Right breast invasive mammary carcinoma s/p mastectomy and SNL, pT1c(m) pN1a, ER low positive, PR negative and HER2 amplified-stage II.  status post  adjuvant TCH-P x6 cycles [last 72/27]; Dr.Chrystal- [s/p  01/28/2022]. Phesgo cycle #1 on 01/18/2022 [[place]]; s/p adjuvant Phesgo every 3 weeks. [Total of 1 year- until  May 2024].  March 13th, 2024- EF 55 to 60%. Intact uterus.   # No clinical evidence of recurrence. Continue surveillance on a clinical basis.Recommend UNI LAT LEFT screening Mammogram. # FEB 2023- LMP [chemo induced]- check estrogen/LH/FSH. #Discussed the role of anti-hormonal therapy mechanism of action; since patient is premenopausal recommend tamoxifen.  I would recommend tamoxifen for 5 years.  Long discussion regarding the potential adverse events on tamoxifen including but not limited to hot flashes, mood swings, thromboembolic events strokes and also small risk of uterine cancers. Vs- AI. Await Hormones levels.   # 07/13/2021: Lung carcinoid [ status post FNA left lung nodule  s/p prior evaluation with pulmonary [Dr.Singh; [place]] and thoracic surgery . MAY 9th, 2024- Mild radiotracer activity associated rounded nodules within the LEFT lung consistent with well differentiated neuroendocrine tumor tumor status post evaluation with Dr.Hendrickson re: left lung carcinoid.  Awaiting repeat imaging in September 2024. Awaiting surgery in sep, 2024.    # Neuropathy/myalgias/hypomagnesemia: Grade 1-2 continue compliance with magnesium; exercising- stable.   # weight gain/PCOS/ steroids induced- DM- PP BG- 134.  Patient currently on metformin.  Counseled at length regarding stress elevation/obesity management. stable.   # Left toe swelling/acute gout- ? Sec to phesgo vs others. On allopurinol; will try colchicine short course. Stable.   # port/IV access- Stable; discussed re: pro and cons of keeping the port vs. Explantation.   Interested in having her port taken out.  Will refer to IR locally for explanation at next visit.    /Reaction after the Dototate dye # DISPOSITION:   # UNI LAT LEFT screening # follow up in 3 months- MD; labs- cbc/cmp/mag; estrogen/LH/ FSH. - Dr.B  Above plan of care was discussed with patient/family in detail.  My contact information was given to the patient/family.     Earna Coder, MD 01/02/2023 9:31 AM

## 2023-01-03 ENCOUNTER — Other Ambulatory Visit: Payer: Self-pay

## 2023-01-14 ENCOUNTER — Encounter: Payer: Self-pay | Admitting: Thoracic Surgery (Cardiothoracic Vascular Surgery)

## 2023-01-14 ENCOUNTER — Ambulatory Visit
Admission: RE | Admit: 2023-01-14 | Discharge: 2023-01-14 | Disposition: A | Discharge status: Home / Self Care | Payer: 59 | Source: Ambulatory Visit | Attending: Thoracic Surgery (Cardiothoracic Vascular Surgery) | Admitting: Thoracic Surgery (Cardiothoracic Vascular Surgery)

## 2023-01-14 ENCOUNTER — Ambulatory Visit: Payer: 59 | Admitting: Thoracic Surgery (Cardiothoracic Vascular Surgery)

## 2023-01-14 VITALS — BP 136/84 | HR 83 | Resp 18 | Ht 69.0 in | Wt 261.0 lb

## 2023-01-14 DIAGNOSIS — D3A090 Benign carcinoid tumor of the bronchus and lung: Secondary | ICD-10-CM

## 2023-01-14 NOTE — Progress Notes (Signed)
[address]       [name] [address]             [phone]    HPI: [name] returns to discuss surgery for carcinoid tumors of the left lower lobe.  Allison Harper is a 52 year old woman with a history of breast cancer, peripheral neuropathy, and carcinoid tumors of the lung.  Diagnosed with breast cancer in 2023.  As part of her workup she had a CT of the chest which showed to left lower lobe lung nodules.  The nodules were not hypermetabolic on FDG PET.  Bronchoscopy and biopsy showed low-grade carcinoid tumor.  She completed therapy for her breast cancer.  She then had a dotatate PET which showed low-grade activity in the left lower lobe lung nodules.  She feels well.  No chest pain, pressure, tightness, or shortness of breath.  Very anxious about the surgery.  Very anxious about pain management.  Past Medical History:  Diagnosis Date   Breast cancer (HCC)    Breast screening, unspecified 05/06/2010   Obesity 05/06/2010   Other benign neoplasm of connective and other soft tissue of trunk, unspecified 05/06/2010    Current Outpatient Medications  Medication Sig Dispense Refill   allopurinol (ZYLOPRIM) 100 MG tablet Take 100 mg by mouth daily.     colchicine 0.6 MG tablet Take 1 tablet (0.6 mg total) by mouth daily. 7 tablet 0   Magnesium Cl-Calcium Carbonate (SLOW MAGNESIUM/CALCIUM) 70-117 MG TBEC Take 1 tablet by mouth 2 (two) times daily. 60 tablet 6   Multiple Vitamins-Minerals (MULTIVITAMIN WITH MINERALS) tablet Take 1 tablet by mouth daily.     No current facility-administered medications for this visit.   Facility-Administered Medications Ordered in Other Visits  Medication Dose Route Frequency Provider Last Rate Last Admin   heparin lock flush 100 UNIT/ML injection             Physical Exam BP 136/84 (BP Location: Left Arm, Patient Position: Sitting)   Pulse 83   Resp 18   Ht 5\' 9"  (1.753 m)   Wt 261 lb (118.4 kg)   SpO2 97% Comment: RA   BMI 38.4 kg/m  52 year old woman in no acute distress Alert and oriented x 3 with no focal deficits No cervical or supraclavicular adenopathy Status post right mastectomy Cardiac regular rate and rhythm with a 2/6 murmur Lungs clear with equal breath sounds bilaterally No peripheral edema  Diagnostic Tests: CT CHEST WITHOUT CONTRAST   TECHNIQUE: Multidetector CT imaging of the chest was performed following the standard protocol without IV contrast.   RADIATION DOSE REDUCTION: This exam was performed according to the departmental dose-optimization program which includes automated exposure control, adjustment of the mA and/or kV according to patient size and/or use of iterative reconstruction technique.   COMPARISON:  PET-CT dated Sep 12, 2018   FINDINGS: Cardiovascular: Normal heart size. No pericardial effusion. Normal caliber thoracic aorta with mild calcified plaque. No visible coronary artery calcifications. Left chest wall port with tip near the superior cavoatrial junction.   Mediastinum/Nodes: Esophagus and thyroid are unremarkable. Surgical clips of the right axilla. No enlarged lymph nodes seen in the chest.   Lungs/Pleura: Central airways are patent. Mosaic attenuation. Mild subpleural reticular opacities of the right upper lobe, likely postradiation change. Stable bilateral solid pulmonary nodules. Right lower lobe solid pulmonary nodule measuring 4 mm (not as well seen on prior due to slice thickness) on series 5, image 77, left upper lobe  solid nodule measuring 12 x 9 mm on image 49, and left lower lobe pulmonary nodule measuring 8 x 8 mm on image 26.   Upper Abdomen: Prior cholecystectomy.  Hepatic steatosis.   Musculoskeletal: Prior right mastectomy. Asymmetric left breast soft tissue with associated biopsy marking clip, correlate with prior mammography. No aggressive appearing osseous lesions.   IMPRESSION: 1. Stable bilateral solid pulmonary nodules,  largest are located in the left lower lobe. Nodules demonstrated mild tracer uptake on prior Dotatate PET, consistent with well-differentiated neuroendocrine tumor. 2. 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 3. Hepatic steatosis. 4. Aortic Atherosclerosis (ICD10-I70.0).     Electronically Signed   By: Allegra Lai M.D.   On: 01/14/2023 10:35 I personally reviewed the CT images.  There are 2 dominant nodules in the left lower lobe (despite report above indicating upper lobe).  Smaller nodules elsewhere  Impression: Allison Harper is a 52 year old woman with a history of breast cancer, peripheral neuropathy, and carcinoid tumors of the lung.    Carcinoid tumors of the lung-has multiple lung nodules.  There are 2 dominant nodules in the left lower lobe.  These measure about a centimeter in about 1.3 cm respectively.  Biopsy has shown carcinoid tumor.  Low-grade uptake on dotatate PET.  That is not unusual with pulmonary carcinoid.  Treatment is surgical resection.  I suspect we will have to do a lobectomy, but we will assess to see if I can do sublobar resections.    I described the proposed operative procedure of a robotic assisted left lower lobectomy to Ms. [name].  We have had this discussion previously but went through it again in detail.  I informed her of the general nature of the procedure including the need for general anesthesia, the incisions to be used, the use of the surgical robot, use of a drainage tube postoperatively, the expected hospital stay, and the overall recovery.  I informed her of the indications, risks, benefits, and alternatives.  She understands the risks include, but not limited to death, MI, DVT, PE, bleeding, possible need for transfusion, prolonged air leak, cardiac arrhythmias, as well as possibility of other procedural  complications including chronic pain issues.  She has a great deal of concern about pain management.  I described our pain management regimen which includes intercostal nerve blocks, baseline Tylenol, gabapentin, and as needed narcotics.  She is very concerned about side effects from gabapentin.  I recommend she try that at a low dose and then we can stop it if she is having issues.  Breast cancer-stage IIa (T1, N1) ductal carcinoma.  Status postmastectomy and sick side goals of adjuvant TCH-P.  Plan: Robotic assisted left lower lobectomy on 01/23/2023  Loreli Slot, MD [facility] [phone]

## 2023-01-14 NOTE — Progress Notes (Signed)
      [address]       [name] [address]             [phone]     HPI:  Patient returns for routine postoperative follow-up having undergone  The patient's early postoperative recovery while in the hospital was notable for Since hospital discharge the patient reports   Current Outpatient Medications  Medication Sig Dispense Refill  . allopurinol (ZYLOPRIM) 100 MG tablet Take 100 mg by mouth daily.    . colchicine 0.6 MG tablet Take 1 tablet (0.6 mg total) by mouth daily. 7 tablet 0  . Magnesium Cl-Calcium Carbonate (SLOW MAGNESIUM/CALCIUM) 70-117 MG TBEC Take 1 tablet by mouth 2 (two) times daily. 60 tablet 6  . Multiple Vitamins-Minerals (MULTIVITAMIN WITH MINERALS) tablet Take 1 tablet by mouth daily.     No current facility-administered medications for this visit.   Facility-Administered Medications Ordered in Other Visits  Medication Dose Route Frequency Provider Last Rate Last Admin  . heparin lock flush 100 UNIT/ML injection             Physical Exam  Diagnostic Tests:   Impression:  Plan:   Loreli Slot, MD [facility] [phone]

## 2023-01-14 NOTE — H&P (View-Only) (Signed)
[address]       [name] [address]             [phone]    HPI: Ms. 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  Cyara Baisch is a 52 year old woman with a history of breast cancer, peripheral neuropathy, and carcinoid tumors of the lung.  Diagnosed with breast cancer in 2023.  As part of her workup she had a CT of the chest which showed to left lower lobe lung nodules.  The nodules were not hypermetabolic on FDG PET.  Bronchoscopy and biopsy showed low-grade carcinoid tumor.  She completed therapy for her breast cancer.  She then had a dotatate PET which showed low-grade activity in the left lower lobe lung nodules.  She feels well.  No chest pain, pressure, tightness, or shortness of breath.  Very anxious about the surgery.  Very anxious about pain management.  Past Medical History:  Diagnosis Date   Breast cancer (HCC)    Breast screening, unspecified 05/06/2010   Obesity 05/06/2010   Other benign neoplasm of connective and other soft tissue of trunk, unspecified 05/06/2010    Current Outpatient Medications  Medication Sig Dispense Refill   allopurinol (ZYLOPRIM) 100 MG tablet Take 100 mg by mouth daily.     colchicine 0.6 MG tablet Take 1 tablet (0.6 mg total) by mouth daily. 7 tablet 0   Magnesium Cl-Calcium Carbonate (SLOW MAGNESIUM/CALCIUM) 70-117 MG TBEC Take 1 tablet by mouth 2 (two) times daily. 60 tablet 6   Multiple Vitamins-Minerals (MULTIVITAMIN WITH MINERALS) tablet Take 1 tablet by mouth daily.     No current facility-administered medications for this visit.   Facility-Administered Medications Ordered in Other Visits  Medication Dose Route Frequency Provider Last Rate Last Admin   heparin lock flush 100 UNIT/ML injection             Physical Exam BP 136/84 (BP Location: Left Arm, Patient Position: Sitting)   Pulse 83   Resp 18   Ht 5\' 9"  (1.753 m)   Wt 261 lb (118.4 kg)   SpO2 97% Comment: RA   BMI 38.7 kg/m  52 year old woman in no acute distress Alert and oriented x 3 with no focal deficits No cervical or supraclavicular adenopathy Status post right mastectomy Cardiac regular rate and rhythm with a 2/6 murmur Lungs clear with equal breath sounds bilaterally No peripheral edema  Diagnostic Tests: CT CHEST WITHOUT CONTRAST   TECHNIQUE: Multidetector CT imaging of the chest was performed following the standard protocol without IV contrast.   RADIATION DOSE REDUCTION: This exam was performed according to the departmental dose-optimization program which includes automated exposure control, adjustment of the mA and/or kV according to patient size and/or use of iterative reconstruction technique.   COMPARISON:  PET-CT dated Sep 12, 2018   FINDINGS: Cardiovascular: Normal heart size. No pericardial effusion. Normal caliber thoracic aorta with mild calcified plaque. No visible coronary artery calcifications. Left chest wall port with tip near the superior cavoatrial junction.   Mediastinum/Nodes: Esophagus and thyroid are unremarkable. Surgical clips of the right axilla. No enlarged lymph nodes seen in the chest.   Lungs/Pleura: Central airways are patent. Mosaic attenuation. Mild subpleural reticular opacities of the right upper lobe, likely postradiation change. Stable bilateral solid pulmonary nodules. Right lower lobe solid pulmonary nodule measuring 4 mm (not as well seen on prior due to slice thickness) on series 5, image 77, left upper lobe  solid nodule measuring 12 x 9 mm on image 49, and left lower lobe pulmonary nodule measuring 8 x 8 mm on image 26.   Upper Abdomen: Prior cholecystectomy.  Hepatic steatosis.   Musculoskeletal: Prior right mastectomy. Asymmetric left breast soft tissue with associated biopsy marking clip, correlate with prior mammography. No aggressive appearing osseous lesions.   IMPRESSION: 1. Stable bilateral solid pulmonary nodules,  largest are located in the left lower lobe. Nodules demonstrated mild tracer uptake on prior Dotatate PET, consistent with well-differentiated neuroendocrine tumor. 2. 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 3. Hepatic steatosis. 4. Aortic Atherosclerosis (ICD10-I70.0).     Electronically Signed   By: Allegra Lai M.D.   On: 01/14/2023 10:35 I personally reviewed the CT images.  There are 2 dominant nodules in the left lower lobe (despite report above indicating upper lobe).  Smaller nodules elsewhere  Impression: Legna Laskowski is a 52 year old woman with a history of breast cancer, peripheral neuropathy, and carcinoid tumors of the lung.    Carcinoid tumors of the lung-has multiple lung nodules.  There are 2 dominant nodules in the left lower lobe.  These measure about a centimeter in about 1.3 cm respectively.  Biopsy has shown carcinoid tumor.  Low-grade uptake on dotatate PET.  That is not unusual with pulmonary carcinoid.  Treatment is surgical resection.  I suspect we will have to do a lobectomy, but we will assess to see if I can do sublobar resections.    I described the proposed operative procedure of a robotic assisted left lower lobectomy to Ms. Avedisian.  We have had this discussion previously but went through it again in detail.  I informed her of the general nature of the procedure including the need for general anesthesia, the incisions to be used, the use of the surgical robot, use of a drainage tube postoperatively, the expected hospital stay, and the overall recovery.  I informed her of the indications, risks, benefits, and alternatives.  She understands the risks include, but not limited to death, MI, DVT, PE, bleeding, possible need for transfusion, prolonged air leak, cardiac arrhythmias, as well as possibility of other procedural  complications including chronic pain issues.  She has a great deal of concern about pain management.  I described our pain management regimen which includes intercostal nerve blocks, baseline Tylenol, gabapentin, and as needed narcotics.  She is very concerned about side effects from gabapentin.  I recommend she try that at a low dose and then we can stop it if she is having issues.  Breast cancer-stage IIa (T1, N1) ductal carcinoma.  Status postmastectomy and sick side goals of adjuvant TCH-P.  Plan: Robotic assisted left lower lobectomy on 01/23/2023  Loreli Slot, MD [facility] [phone]

## 2023-01-20 ENCOUNTER — Telehealth: Payer: Self-pay

## 2023-01-20 DIAGNOSIS — C50911 Malignant neoplasm of unspecified site of right female breast: Secondary | ICD-10-CM | POA: Insufficient documentation

## 2023-01-20 NOTE — Telephone Encounter (Signed)
STD/LOA Forms completed and faxed to [place] (LOA) @ [phone]. Beginning LOA 01/23/23 through 03/25/23. DOS 01/23/23. Original forms mailed to pt's home address.

## 2023-01-21 ENCOUNTER — Other Ambulatory Visit (HOSPITAL_COMMUNITY): Payer: 59

## 2023-01-21 NOTE — Pre-Procedure Instructions (Signed)
Surgical Instructions   Your procedure is scheduled on September . Report to [facility] Main Entrance "A" at 5:30 A.M., then check in with the Admitting office. Any questions or running late day of surgery: call [phone]  Questions prior to your surgery date: call [phone], Monday-Friday, 8am-4pm. If you experience any cold or flu symptoms such as cough, fever, chills, shortness of breath, etc. between now and your scheduled surgery, please notify us at the above number.     Remember:  Do not eat or drink after midnight the night before your surgery    Take these medicines the morning of surgery with A SIP OF WATER: allopurinol (ZYLOPRIM)    One week prior to surgery, STOP taking any Aspirin (unless otherwise instructed by your surgeon) Aleve, Naproxen, Ibuprofen, Motrin, Advil, Goody's, BC's, all herbal medications, fish oil, and non-prescription vitamins.                     Do NOT Smoke (Tobacco/Vaping) for 24 hours prior to your procedure.  If you use a CPAP at night, you may bring your mask/headgear for your overnight stay.   You will be asked to remove any contacts, glasses, piercing's, hearing aid's, dentures/partials prior to surgery. Please bring cases for these items if needed.    Patients discharged the day of surgery will not be allowed to drive home, and someone needs to stay with them for 24 hours.  SURGICAL WAITING ROOM VISITATION Patients may have no more than 2 support people in the waiting area - these visitors may rotate.   Pre-op nurse will coordinate an appropriate time for 1 ADULT support person, who may not rotate, to accompany patient in pre-op.  Children under the age of 33 must have an adult with them who is not the patient and must remain in the main waiting area with an adult.  If the patient needs to stay at the hospital during part of their recovery, the visitor guidelines for inpatient rooms apply.  Please refer to the [facility] website  for the visitor guidelines for any additional information.   If you received a COVID test during your pre-op visit  it is requested that you wear a mask when out in public, stay away from anyone that may not be feeling well and notify your surgeon if you develop symptoms. If you have been in contact with anyone that has tested positive in the last 10 days please notify you surgeon.      Pre-operative CHG Bathing Instructions   You can play a key role in reducing the risk of infection after surgery. Your skin needs to be as free of germs as possible. You can reduce the number of germs on your skin by washing with CHG (chlorhexidine gluconate) soap before surgery. CHG is an antiseptic soap that kills germs and continues to kill germs even after washing.   DO NOT use if you have an allergy to chlorhexidine/CHG or antibacterial soaps. If your skin becomes reddened or irritated, stop using the CHG and notify one of our RNs at [phone].              TAKE A SHOWER THE NIGHT BEFORE SURGERY AND THE DAY OF SURGERY    Please keep in mind the following:  DO NOT shave, including legs and underarms, 48 hours prior to surgery.   You may shave your face before/day of surgery.  Place clean sheets on your bed the night before surgery Use a  clean washcloth (not used since being washed) for each shower. DO NOT sleep with pet's night before surgery.  CHG Shower Instructions:  Wash your face and private area with normal soap. If you choose to wash your hair, wash first with your normal shampoo.  After you use shampoo/soap, rinse your hair and body thoroughly to remove shampoo/soap residue.  Turn the water OFF and apply half the bottle of CHG soap to a CLEAN washcloth.  Apply CHG soap ONLY FROM YOUR NECK DOWN TO YOUR TOES (washing for 3-5 minutes)  DO NOT use CHG soap on face, private areas, open wounds, or sores.  Pay special attention to the area where your surgery is being performed.  If you are having  back surgery, having someone wash your back for you may be helpful. Wait 2 minutes after CHG soap is applied, then you may rinse off the CHG soap.  Pat dry with a clean towel  Put on clean pajamas    Additional instructions for the day of surgery: DO NOT APPLY any lotions, deodorants, cologne, or perfumes.   Do not wear jewelry or makeup Do not wear nail polish, gel polish, artificial nails, or any other type of covering on natural nails (fingers and toes) Do not bring valuables to the hospital. [facility] is not responsible for valuables/personal belongings. Put on clean/comfortable clothes.  Please brush your teeth.  Ask your nurse before applying any prescription medications to the skin.

## 2023-01-22 ENCOUNTER — Other Ambulatory Visit: Payer: Self-pay

## 2023-01-22 ENCOUNTER — Ambulatory Visit (HOSPITAL_COMMUNITY)
Admission: RE | Admit: 2023-01-22 | Discharge: 2023-01-22 | Disposition: A | Discharge status: Home / Self Care | Payer: 59 | Source: Ambulatory Visit | Attending: Thoracic Surgery (Cardiothoracic Vascular Surgery) | Admitting: Thoracic Surgery (Cardiothoracic Vascular Surgery)

## 2023-01-22 ENCOUNTER — Encounter (HOSPITAL_COMMUNITY)
Admission: RE | Admit: 2023-01-22 | Discharge: 2023-01-22 | Disposition: A | Discharge status: Home / Self Care | Payer: 59 | Source: Ambulatory Visit | Attending: Thoracic Surgery (Cardiothoracic Vascular Surgery) | Admitting: Thoracic Surgery (Cardiothoracic Vascular Surgery)

## 2023-01-22 ENCOUNTER — Encounter (HOSPITAL_COMMUNITY): Payer: Self-pay

## 2023-01-22 VITALS — BP 159/81 | HR 71 | Temp 98.3°F | Resp 18 | Ht 69.0 in | Wt 262.6 lb

## 2023-01-22 DIAGNOSIS — E669 Obesity, unspecified: Secondary | ICD-10-CM | POA: Insufficient documentation

## 2023-01-22 DIAGNOSIS — D3A090 Benign carcinoid tumor of the bronchus and lung: Secondary | ICD-10-CM

## 2023-01-22 DIAGNOSIS — Z6838 Body mass index (BMI) 38.0-38.9, adult: Secondary | ICD-10-CM | POA: Insufficient documentation

## 2023-01-22 DIAGNOSIS — D3A00 Benign carcinoid tumor of unspecified site: Secondary | ICD-10-CM | POA: Insufficient documentation

## 2023-01-22 DIAGNOSIS — Z1152 Encounter for screening for COVID-19: Secondary | ICD-10-CM | POA: Insufficient documentation

## 2023-01-22 DIAGNOSIS — Z853 Personal history of malignant neoplasm of breast: Secondary | ICD-10-CM | POA: Insufficient documentation

## 2023-01-22 DIAGNOSIS — Z01812 Encounter for preprocedural laboratory examination: Secondary | ICD-10-CM | POA: Insufficient documentation

## 2023-01-22 DIAGNOSIS — Z01818 Encounter for other preprocedural examination: Secondary | ICD-10-CM

## 2023-01-22 LAB — URINALYSIS, ROUTINE W REFLEX MICROSCOPIC
Bilirubin Urine: NEGATIVE
Glucose, UA: NEGATIVE mg/dL
Hgb urine dipstick: NEGATIVE
Ketones, ur: NEGATIVE mg/dL
Nitrite: NEGATIVE
Protein, ur: NEGATIVE mg/dL
Specific Gravity, Urine: 1.006 (ref 1.005–1.030)
pH: 6 (ref 5.0–8.0)

## 2023-01-22 LAB — APTT: aPTT: 34 s (ref 24–36)

## 2023-01-22 LAB — SURGICAL PCR SCREEN
MRSA, PCR: NEGATIVE
Staphylococcus aureus: NEGATIVE

## 2023-01-22 LAB — COMPREHENSIVE METABOLIC PANEL WITH GFR
ALT: 25 U/L (ref 0–44)
AST: 34 U/L (ref 15–41)
Albumin: 4 g/dL (ref 3.5–5.0)
Alkaline Phosphatase: 64 U/L (ref 38–126)
Anion gap: 13 (ref 5–15)
BUN: 7 mg/dL (ref 6–20)
CO2: 23 mmol/L (ref 22–32)
Calcium: 9 mg/dL (ref 8.9–10.3)
Chloride: 100 mmol/L (ref 98–111)
Creatinine, Ser: 0.73 mg/dL (ref 0.44–1.00)
GFR, Estimated: >60 mL/min (ref >60)
Glucose, Bld: 124 mg/dL — ABNORMAL HIGH (ref 70–99)
Potassium: 3.8 mmol/L (ref 3.5–5.1)
Sodium: 136 mmol/L (ref 135–145)
Total Bilirubin: 0.7 mg/dL (ref 0.3–1.2)
Total Protein: 7.2 g/dL (ref 6.5–8.1)

## 2023-01-22 LAB — CBC
HCT: 39.3 % (ref 36.0–46.0)
Hemoglobin: 12.8 g/dL (ref 12.0–15.0)
MCH: 28.3 pg (ref 26.0–34.0)
MCHC: 32.6 g/dL (ref 30.0–36.0)
MCV: 86.9 fL (ref 80.0–100.0)
Platelets: 254 10*3/uL (ref 150–400)
RBC: 4.52 MIL/uL (ref 3.87–5.11)
RDW: 13.6 % (ref 11.5–15.5)
WBC: 7.3 10*3/uL (ref 4.0–10.5)
nRBC: 0 % (ref 0.0–0.2)

## 2023-01-22 LAB — PROTIME-INR
INR: 0.9 (ref 0.8–1.2)
Prothrombin Time: 12.5 s (ref 11.4–15.2)

## 2023-01-22 LAB — SARS CORONAVIRUS 2 BY RT PCR: SARS Coronavirus 2 by RT PCR: NEGATIVE

## 2023-01-22 NOTE — Anesthesia Preprocedure Evaluation (Signed)
Anesthesia Evaluation  Patient identified by MRN, date of birth, ID band Patient awake    Reviewed: Allergy & Precautions, H&P , NPO status , Patient's Chart, lab work & pertinent test results  Airway Mallampati: II   Neck ROM: full    Dental   Pulmonary  LLL carcinoid   breath sounds clear to auscultation       Cardiovascular negative cardio ROS  Rhythm:regular Rate:Normal     Neuro/Psych    GI/Hepatic   Endo/Other    Renal/GU      Musculoskeletal  (+) Arthritis ,    Abdominal   Peds  Hematology   Anesthesia Other Findings   Reproductive/Obstetrics H/o breast CA                             Anesthesia Physical Anesthesia Plan  ASA: 3  Anesthesia Plan: General   Post-op Pain Management:    Induction: Intravenous  PONV Risk Score and Plan: 3 and Ondansetron, Dexamethasone, Treatment may vary due to age or medical condition and Midazolam  Airway Management Planned: Double Lumen EBT  Additional Equipment: Arterial line  Intra-op Plan:   Post-operative Plan: Extubation in OR  Informed Consent: I have reviewed the patients History and Physical, chart, labs and discussed the procedure including the risks, benefits and alternatives for the proposed anesthesia with the patient or authorized representative who has indicated his/her understanding and acceptance.     Dental advisory given  Plan Discussed with: CRNA, Anesthesiologist and Surgeon  Anesthesia Plan Comments: (PAT note written 01/22/2023 by Shonna Chock, PA-C.  )       Anesthesia Quick Evaluation

## 2023-01-22 NOTE — Progress Notes (Signed)
PCP - no Cardiologist - denies Oncologist- Earna Coder, MD   PPM/ICD - denies Device Orders - n/a Rep Notified - n/a  Chest x-ray - 01/22/2023 EKG - 01/22/2023 Stress Test - denies ECHO - 07/17/2022 Cardiac Cath - denies  Sleep Study - denies CPAP - n/a  Fasting Blood Sugar - no DM Checks Blood Sugar _____ times a day  Last dose of GLP1 agonist-  n/a GLP1 instructions: n/a  Blood Thinner Instructions: n/a Aspirin Instructions: n/a  ERAS Protcol - NPO PRE-SURGERY Ensure or G2- no  COVID TEST- rapid 01/22/2023   Anesthesia review: yes, cardiac studies (ECHO in 2024)  Patient denies shortness of breath, fever, cough and chest pain at PAT appointment   All instructions explained to the patient, with a verbal understanding of the material. Patient agrees to go over the instructions while at home for a better understanding. Patient also instructed to self quarantine after being tested for COVID-19. The opportunity to ask questions was provided.

## 2023-01-22 NOTE — Progress Notes (Signed)
Type and Screen will need to be redrawn DOS. Order placed.

## 2023-01-22 NOTE — Progress Notes (Signed)
Anesthesia Chart Review:  Case: [id number] Date/Time: 01/23/23 0745   Procedures:      XI ROBOTIC ASSISTED THORACOSCOPY-LEFT LOWER LOBECTOMY (Left: Chest)     possible XI ROBOTIC ASSISTED THORACOSCOPY DIAPHRAGMATIC PLICATION (Left)   Anesthesia type: General   Pre-op diagnosis: LLL CARCINOID TUMOR   [facility]: [facility] ROOM 10 / [facility] OR   Surgeons: Loreli Slot, MD       DISCUSSION: Patient is a 52 year old female scheduled for the above procedure.  History includes never smoker, breast cancer (s/p right mastectomy 04/20/21, chemoradiation), anemia, LLL carcinoid tumor.  BMI is consistent with obesity.  Per Progress Note by Dr. Dorris Fetch, "Allison Harper is a 52 year old woman with a history of breast cancer, peripheral neuropathy, and carcinoid tumors of the lung.  Diagnosed with breast cancer in 2023.  As part of her workup she had a CT of the chest which showed to left lower lobe lung nodules.  The nodules were not hypermetabolic on FDG PET.  Bronchoscopy and biopsy showed low-grade carcinoid tumor.   She completed therapy for her breast cancer.  She then had a dotatate PET which showed low-grade activity in the left lower lobe lung nodules."  12/23/2022 COVID-19 test negative.  01/22/23 preoperative CXR is still in process.  01/22/23 CMP is still in process. Anesthesia team to evaluate on the day of surgery. ABG and T&S for day of surgery.   VS: BP (!) 159/81   Pulse 71   Temp 36.8 C   Resp 18   Ht 5\' 9"  (1.753 m)   Wt 119.1 kg   SpO2 99%   BMI 38.78 kg/m   PROVIDERS: Pcp, No Louretta Shorten, MD is HEM-ONC Carmina Miller, MD is RAD-ONC  LABS: Available PAT lab results reviewed. CMP is still in process. (all labs ordered are listed, but only abnormal results are displayed)  Labs Reviewed  URINALYSIS, ROUTINE W REFLEX MICROSCOPIC - Abnormal; Notable for the following components:      Result Value   APPearance HAZY (*)    Leukocytes,Ua SMALL (*)    Bacteria, UA RARE  (*)    All other components within normal limits  SARS CORONAVIRUS 2 BY RT PCR  SURGICAL PCR SCREEN  CBC  COMPREHENSIVE METABOLIC PANEL  PROTIME-INR  APTT    PFTs 10/08/22: FVC 2.82 (68%), post 2.89 (70%). FEV1 2.29 (71%), post 2.54 (78%). DLCO unc 19.32 (78%).   IMAGES: CXR 01/22/23: In process  CT Chest 01/14/23: IMPRESSION: 1. Stable bilateral solid pulmonary nodules, largest are located in the left lower lobe. Nodules demonstrated mild tracer uptake on prior Dotatate PET, consistent with well-differentiated neuroendocrine tumor. 2. 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 3. Hepatic steatosis. 4. Aortic Atherosclerosis (ICD10-I70.0). ADDENDUM: Corrected report: Original report was generated with an error due to voice recognition software in findings section, corrected as follows: on series 5, image 77, left LOWER lobe solid nodule measuring 12 x 9 mm on image 49, and left lower lobe pulmonary nodule measuring 8 x 8 mm on image 26.   DG Sniff Test 10/25/22: IMPRESSION: Mild elevation of the left hemidiaphragm without evidence of diaphragmatic paresis.    EKG: 01/22/23: Normal sinus rhythm Septal infarct , age undetermined Abnormal ECG No previous ECGs available   CV: Echo 07/17/22: IMPRESSIONS   1. Left ventricular ejection fraction, by estimation, is 55 to 60%. The  left ventricle has normal function. The left ventricle  has no regional  wall motion abnormalities. There is mild left ventricular hypertrophy.  Left ventricular diastolic parameters  were normal. The average left ventricular global longitudinal strain is  -17.5 %. The global longitudinal strain is normal.   2. Right ventricular systolic function is normal. The right ventricular  size is normal.   3. The mitral valve is normal in structure. No evidence of mitral  valve  regurgitation. No evidence of mitral stenosis.   4. The aortic valve is normal in structure. Aortic valve regurgitation is  mild. No aortic stenosis is present.   Past Medical History:  Diagnosis Date   Anemia 2023   Breast cancer (HCC)    Breast screening, unspecified 05/06/2010   Obesity 05/06/2010   Other benign neoplasm of connective and other soft tissue of trunk, unspecified 05/06/2010    Past Surgical History:  Procedure Laterality Date   CHOLECYSTECTOMY     CYST EXCISION  05/06/2006   from back   LIPOMA EXCISION Left 09/04/2010   flank   MASTECTOMY Right 2022   TONSILLECTOMY      MEDICATIONS:  allopurinol (ZYLOPRIM) 100 MG tablet   colchicine 0.6 MG tablet   Magnesium Cl-Calcium Carbonate (SLOW MAGNESIUM/CALCIUM) 70-117 MG TBEC   Multiple Vitamins-Minerals (MULTIVITAMIN WITH MINERALS) tablet   OVER THE COUNTER MEDICATION   No current facility-administered medications for this encounter.    heparin lock flush 100 UNIT/ML injection    Shonna Chock, PA-C Surgical Short Stay/Anesthesiology [facility] Phone [phone] [facility] Phone [phone] 01/22/2023 4:56 PM

## 2023-01-22 NOTE — Progress Notes (Signed)
Surgical Instructions     Your procedure is scheduled tomorrow, on September 19 . Report to [facility] Main Entrance "A" at 5:30 A.M., then check in with the Admitting office. Any questions or running late day of surgery: call [phone]   Questions prior to your surgery date: call [phone], Monday-Friday, 8am-4pm. If you experience any cold or flu symptoms such as cough, fever, chills, shortness of breath, etc. between now and your scheduled surgery, please notify us at the above number.            Remember:       Do not eat or drink after midnight the night before your surgery          Take these medicines the morning of surgery with A SIP OF WATER: allopurinol (ZYLOPRIM)      One week prior to surgery, STOP taking any Aspirin (unless otherwise instructed by your surgeon) Aleve, Naproxen, Ibuprofen, Motrin, Advil, Goody's, BC's, all herbal medications, fish oil, and non-prescription vitamins.                     Do NOT Smoke (Tobacco/Vaping) for 24 hours prior to your procedure.   If you use a CPAP at night, you may bring your mask/headgear for your overnight stay.   You will be asked to remove any contacts, glasses, piercing's, hearing aid's, dentures/partials prior to surgery. Please bring cases for these items if needed.    Patients discharged the day of surgery will not be allowed to drive home, and someone needs to stay with them for 24 hours.   SURGICAL WAITING ROOM VISITATION Patients may have no more than 2 support people in the waiting area - these visitors may rotate.   Pre-op nurse will coordinate an appropriate time for 1 ADULT support person, who may not rotate, to accompany patient in pre-op.  Children under the age of 9 must have an adult with them who is not the patient and must remain in the main waiting area with an adult.   If the patient needs to stay at the hospital during part of their recovery, the visitor guidelines for inpatient rooms apply.    Please refer to the [facility] website for the visitor guidelines for any additional information.     If you received a COVID test during your pre-op visit  it is requested that you wear a mask when out in public, stay away from anyone that may not be feeling well and notify your surgeon if you develop symptoms. If you have been in contact with anyone that has tested positive in the last 10 days please notify you surgeon.         Pre-operative CHG Bathing Instructions    You can play a key role in reducing the risk of infection after surgery. Your skin needs to be as free of germs as possible. You can reduce the number of germs on your skin by washing with CHG (chlorhexidine gluconate) soap before surgery. CHG is an antiseptic soap that kills germs and continues to kill germs even after washing.    DO NOT use if you have an allergy to chlorhexidine/CHG or antibacterial soaps. If your skin becomes reddened or irritated, stop using the CHG and notify one of our RNs at [phone].               TAKE A SHOWER THE NIGHT BEFORE SURGERY AND THE DAY OF SURGERY     Please keep in mind  the following:  DO NOT shave, including legs and underarms, 48 hours prior to surgery.   You may shave your face before/day of surgery.  Place clean sheets on your bed the night before surgery Use a clean washcloth (not used since being washed) for each shower. DO NOT sleep with pet's night before surgery.   CHG Shower Instructions:  Wash your face and private area with normal soap. If you choose to wash your hair, wash first with your normal shampoo.  After you use shampoo/soap, rinse your hair and body thoroughly to remove shampoo/soap residue.  Turn the water OFF and apply half the bottle of CHG soap to a CLEAN washcloth.  Apply CHG soap ONLY FROM YOUR NECK DOWN TO YOUR TOES (washing for 3-5 minutes)  DO NOT use CHG soap on face, private areas, open wounds, or sores.  Pay special attention to the area where  your surgery is being performed.  If you are having back surgery, having someone wash your back for you may be helpful. Wait 2 minutes after CHG soap is applied, then you may rinse off the CHG soap.  Pat dry with a clean towel  Put on clean pajamas     Additional instructions for the day of surgery: DO NOT APPLY any lotions, deodorants, cologne, or perfumes.   Do not wear jewelry or makeup Do not wear nail polish, gel polish, artificial nails, or any other type of covering on natural nails (fingers and toes) Do not bring valuables to the hospital. [facility] is not responsible for valuables/personal belongings. Put on clean/comfortable clothes.  Please brush your teeth.  Ask your nurse before applying any prescription medications to the skin.

## 2023-01-23 ENCOUNTER — Other Ambulatory Visit: Payer: Self-pay

## 2023-01-23 ENCOUNTER — Inpatient Hospital Stay (HOSPITAL_COMMUNITY)
Admission: RE | Admit: 2023-01-23 | Discharge: 2023-01-26 | DRG: 164 | Disposition: A | Discharge status: Home / Self Care | Payer: 59 | Attending: Thoracic Surgery (Cardiothoracic Vascular Surgery) | Admitting: Thoracic Surgery (Cardiothoracic Vascular Surgery)

## 2023-01-23 ENCOUNTER — Inpatient Hospital Stay (HOSPITAL_COMMUNITY): Payer: 59 | Admitting: Anesthesiology

## 2023-01-23 ENCOUNTER — Inpatient Hospital Stay (HOSPITAL_COMMUNITY): Payer: 59

## 2023-01-23 ENCOUNTER — Encounter (HOSPITAL_COMMUNITY)
Admission: RE | Disposition: A | Discharge status: Home / Self Care | Payer: Self-pay | Source: Home / Self Care | Attending: Thoracic Surgery (Cardiothoracic Vascular Surgery)

## 2023-01-23 ENCOUNTER — Encounter (HOSPITAL_COMMUNITY): Payer: Self-pay | Admitting: Thoracic Surgery (Cardiothoracic Vascular Surgery)

## 2023-01-23 DIAGNOSIS — Z6838 Body mass index (BMI) 38.0-38.9, adult: Secondary | ICD-10-CM

## 2023-01-23 DIAGNOSIS — Z1152 Encounter for screening for COVID-19: Secondary | ICD-10-CM | POA: Diagnosis not present

## 2023-01-23 DIAGNOSIS — Z01812 Encounter for preprocedural laboratory examination: Secondary | ICD-10-CM | POA: Diagnosis not present

## 2023-01-23 DIAGNOSIS — G629 Polyneuropathy, unspecified: Secondary | ICD-10-CM | POA: Diagnosis present

## 2023-01-23 DIAGNOSIS — J9383 Other pneumothorax: Secondary | ICD-10-CM | POA: Diagnosis not present

## 2023-01-23 DIAGNOSIS — C7A092 Malignant carcinoid tumor of the stomach: Secondary | ICD-10-CM

## 2023-01-23 DIAGNOSIS — Z9221 Personal history of antineoplastic chemotherapy: Secondary | ICD-10-CM | POA: Diagnosis not present

## 2023-01-23 DIAGNOSIS — C3432 Malignant neoplasm of lower lobe, left bronchus or lung: Secondary | ICD-10-CM | POA: Diagnosis not present

## 2023-01-23 DIAGNOSIS — E669 Obesity, unspecified: Secondary | ICD-10-CM | POA: Diagnosis present

## 2023-01-23 DIAGNOSIS — D62 Acute posthemorrhagic anemia: Secondary | ICD-10-CM | POA: Diagnosis not present

## 2023-01-23 DIAGNOSIS — E871 Hypo-osmolality and hyponatremia: Secondary | ICD-10-CM | POA: Diagnosis present

## 2023-01-23 DIAGNOSIS — D3A090 Benign carcinoid tumor of the bronchus and lung: Secondary | ICD-10-CM | POA: Diagnosis present

## 2023-01-23 DIAGNOSIS — Z853 Personal history of malignant neoplasm of breast: Secondary | ICD-10-CM

## 2023-01-23 DIAGNOSIS — Z79899 Other long term (current) drug therapy: Secondary | ICD-10-CM | POA: Diagnosis not present

## 2023-01-23 DIAGNOSIS — Z9011 Acquired absence of right breast and nipple: Secondary | ICD-10-CM | POA: Diagnosis not present

## 2023-01-23 DIAGNOSIS — Z902 Acquired absence of lung [part of]: Principal | ICD-10-CM

## 2023-01-23 HISTORY — PX: PORT-A-CATH REMOVAL: SHX5289

## 2023-01-23 HISTORY — PX: LOBECTOMY: SHX5089

## 2023-01-23 HISTORY — PX: LYMPH NODE BIOPSY: SHX201

## 2023-01-23 LAB — BLOOD GAS, ARTERIAL
Acid-Base Excess: 0.1 mmol/L (ref 0.0–2.0)
Bicarbonate: 24.7 mmol/L (ref 20.0–28.0)
O2 Saturation: 98.8 %
Patient temperature: 37
pCO2 arterial: 39 mmHg (ref 32–48)
pH, Arterial: 7.41 (ref 7.35–7.45)
pO2, Arterial: 96 mmHg (ref 83–108)

## 2023-01-23 LAB — POCT I-STAT 7, (LYTES, BLD GAS, ICA,H+H)
Acid-base deficit: 3 mmol/L — ABNORMAL HIGH (ref 0.0–2.0)
Acid-base deficit: 5 mmol/L — ABNORMAL HIGH (ref 0.0–2.0)
Bicarbonate: 23.7 mmol/L (ref 20.0–28.0)
Bicarbonate: 22.2 mmol/L (ref 20.0–28.0)
Calcium, Ion: 1.2 mmol/L (ref 1.15–1.40)
Calcium, Ion: 1.05 mmol/L — ABNORMAL LOW (ref 1.15–1.40)
HCT: 36 % (ref 36.0–46.0)
HCT: 31 % — ABNORMAL LOW (ref 36.0–46.0)
Hemoglobin: 12.2 g/dL (ref 12.0–15.0)
Hemoglobin: 10.5 g/dL — ABNORMAL LOW (ref 12.0–15.0)
O2 Saturation: 95 %
O2 Saturation: 91 %
Patient temperature: 35.6
Potassium: 4.1 mmol/L (ref 3.5–5.1)
Potassium: 3.5 mmol/L (ref 3.5–5.1)
Sodium: 136 mmol/L (ref 135–145)
Sodium: 140 mmol/L (ref 135–145)
TCO2: 25 mmol/L (ref 22–32)
TCO2: 24 mmol/L (ref 22–32)
pCO2 arterial: 43.7 mmHg (ref 32–48)
pCO2 arterial: 51.7 mmHg — ABNORMAL HIGH (ref 32–48)
pH, Arterial: 7.336 — ABNORMAL LOW (ref 7.35–7.45)
pH, Arterial: 7.24 — ABNORMAL LOW (ref 7.35–7.45)
pO2, Arterial: 74 mmHg — ABNORMAL LOW (ref 83–108)
pO2, Arterial: 73 mmHg — ABNORMAL LOW (ref 83–108)

## 2023-01-23 LAB — ABO/RH: ABO/RH(D): AB POS

## 2023-01-23 LAB — TYPE AND SCREEN
ABO/RH(D): AB POS
Antibody Screen: NEGATIVE

## 2023-01-23 LAB — POCT PREGNANCY, URINE: Preg Test, Ur: NEGATIVE

## 2023-01-23 SURGERY — LOBECTOMY, LUNG, ROBOT-ASSISTED, USING VATS
Anesthesia: General | Site: Chest | Laterality: Left

## 2023-01-23 MED ORDER — PROPOFOL 10 MG/ML IV BOLUS
INTRAVENOUS | Status: DC | PRN
  Administered 2023-01-23 (×2): 100 mg via INTRAVENOUS
  Administered 2023-01-23: 200 mg via INTRAVENOUS

## 2023-01-23 MED ORDER — DEXTROSE-SODIUM CHLORIDE 5-0.45 % IV SOLN: INTRAVENOUS | Status: DC

## 2023-01-23 MED ORDER — DEXAMETHASONE SODIUM PHOSPHATE 10 MG/ML IJ SOLN
INTRAMUSCULAR | Status: AC
  Filled 2023-01-23: qty 1

## 2023-01-23 MED ORDER — FENTANYL CITRATE (PF) 250 MCG/5ML IJ SOLN
INTRAMUSCULAR | Status: AC
  Filled 2023-01-23: qty 5

## 2023-01-23 MED ORDER — ACETAMINOPHEN 500 MG PO TABS
1000.0000 mg | ORAL_TABLET | Freq: Four times a day (QID) | ORAL | Status: DC
  Administered 2023-01-23 – 2023-01-25 (×10): 1000 mg via ORAL
  Filled 2023-01-23 (×10): qty 2

## 2023-01-23 MED ORDER — PHENYLEPHRINE 80 MCG/ML (10ML) SYRINGE FOR IV PUSH (FOR BLOOD PRESSURE SUPPORT)
PREFILLED_SYRINGE | INTRAVENOUS | Status: DC | PRN
  Administered 2023-01-23 (×5): 80 ug via INTRAVENOUS
  Administered 2023-01-23: 40 ug via INTRAVENOUS
  Administered 2023-01-23: 120 ug via INTRAVENOUS

## 2023-01-23 MED ORDER — ONDANSETRON HCL 4 MG/2ML IJ SOLN: 4.0000 mg | Freq: Four times a day (QID) | INTRAMUSCULAR | Status: DC | PRN

## 2023-01-23 MED ORDER — KETOROLAC TROMETHAMINE 15 MG/ML IJ SOLN
15.0000 mg | Freq: Four times a day (QID) | INTRAMUSCULAR | Status: AC
  Administered 2023-01-23 – 2023-01-25 (×8): 15 mg via INTRAVENOUS
  Filled 2023-01-23 (×7): qty 1

## 2023-01-23 MED ORDER — ROCURONIUM BROMIDE 10 MG/ML (PF) SYRINGE
PREFILLED_SYRINGE | INTRAVENOUS | Status: DC | PRN
  Administered 2023-01-23: 20 mg via INTRAVENOUS
  Administered 2023-01-23: 50 mg via INTRAVENOUS
  Administered 2023-01-23 (×3): 20 mg via INTRAVENOUS

## 2023-01-23 MED ORDER — FENTANYL CITRATE (PF) 250 MCG/5ML IJ SOLN
INTRAMUSCULAR | Status: DC | PRN
  Administered 2023-01-23: 100 ug via INTRAVENOUS
  Administered 2023-01-23: 150 ug via INTRAVENOUS

## 2023-01-23 MED ORDER — GABAPENTIN 300 MG PO CAPS: 300.0000 mg | ORAL_CAPSULE | Freq: Two times a day (BID) | ORAL | Status: DC

## 2023-01-23 MED ORDER — OXYCODONE HCL 5 MG PO TABS
5.0000 mg | ORAL_TABLET | ORAL | Status: DC | PRN
  Administered 2023-01-23 – 2023-01-26 (×10): 10 mg via ORAL
  Filled 2023-01-23 (×10): qty 2

## 2023-01-23 MED ORDER — LACTATED RINGERS IV SOLN: INTRAVENOUS | Status: DC | PRN

## 2023-01-23 MED ORDER — OXYCODONE HCL 5 MG/5ML PO SOLN: 5.0000 mg | Freq: Once | ORAL | Status: DC | PRN

## 2023-01-23 MED ORDER — LABETALOL HCL 5 MG/ML IV SOLN
INTRAVENOUS | Status: DC | PRN
  Administered 2023-01-23: 5 mg via INTRAVENOUS

## 2023-01-23 MED ORDER — FENTANYL CITRATE (PF) 100 MCG/2ML IJ SOLN
INTRAMUSCULAR | Status: AC
  Filled 2023-01-23: qty 2

## 2023-01-23 MED ORDER — BUPIVACAINE HCL (PF) 0.5 % IJ SOLN
INTRAMUSCULAR | Status: AC
  Filled 2023-01-23: qty 30

## 2023-01-23 MED ORDER — CEFAZOLIN SODIUM-DEXTROSE 2-4 GM/100ML-% IV SOLN
2.0000 g | INTRAVENOUS | Status: AC
  Administered 2023-01-23: 2 g via INTRAVENOUS
  Filled 2023-01-23: qty 100

## 2023-01-23 MED ORDER — ONDANSETRON HCL 4 MG/2ML IJ SOLN
INTRAMUSCULAR | Status: AC
  Filled 2023-01-23: qty 2

## 2023-01-23 MED ORDER — CHLORHEXIDINE GLUCONATE CLOTH 2 % EX PADS
6.0000 | MEDICATED_PAD | Freq: Every day | CUTANEOUS | Status: DC
  Administered 2023-01-23: 6 via TOPICAL

## 2023-01-23 MED ORDER — LIDOCAINE 2% (20 MG/ML) 5 ML SYRINGE
INTRAMUSCULAR | Status: DC | PRN
  Administered 2023-01-23: 50 mg via INTRAVENOUS

## 2023-01-23 MED ORDER — CHLORHEXIDINE GLUCONATE 0.12 % MT SOLN
15.0000 mL | Freq: Once | OROMUCOSAL | Status: AC
  Administered 2023-01-23: 15 mL via OROMUCOSAL
  Filled 2023-01-23: qty 15

## 2023-01-23 MED ORDER — MIDAZOLAM HCL 5 MG/5ML IJ SOLN
INTRAMUSCULAR | Status: DC | PRN
  Administered 2023-01-23: 2 mg via INTRAVENOUS

## 2023-01-23 MED ORDER — ONDANSETRON HCL 4 MG/2ML IJ SOLN
INTRAMUSCULAR | Status: DC | PRN
  Administered 2023-01-23: 4 mg via INTRAVENOUS

## 2023-01-23 MED ORDER — COLCHICINE 0.6 MG PO TABS: 0.6000 mg | ORAL_TABLET | Freq: Every day | ORAL | Status: DC | PRN

## 2023-01-23 MED ORDER — CHLORHEXIDINE GLUCONATE CLOTH 2 % EX PADS
6.0000 | MEDICATED_PAD | Freq: Every day | CUTANEOUS | Status: DC
  Administered 2023-01-23 – 2023-01-25 (×4): 6 via TOPICAL

## 2023-01-23 MED ORDER — OXYCODONE HCL 5 MG PO TABS: 5.0000 mg | ORAL_TABLET | Freq: Once | ORAL | Status: DC | PRN

## 2023-01-23 MED ORDER — PHENYLEPHRINE HCL-NACL 20-0.9 MG/250ML-% IV SOLN
INTRAVENOUS | Status: DC | PRN
  Administered 2023-01-23: 10 ug/min via INTRAVENOUS

## 2023-01-23 MED ORDER — PROPOFOL 10 MG/ML IV BOLUS
INTRAVENOUS | Status: AC
  Filled 2023-01-23: qty 20

## 2023-01-23 MED ORDER — BUPIVACAINE LIPOSOME 1.3 % IJ SUSP
INTRAMUSCULAR | Status: AC
  Filled 2023-01-23: qty 20

## 2023-01-23 MED ORDER — KETOROLAC TROMETHAMINE 15 MG/ML IJ SOLN
INTRAMUSCULAR | Status: AC
  Filled 2023-01-23: qty 1

## 2023-01-23 MED ORDER — ALLOPURINOL 100 MG PO TABS
100.0000 mg | ORAL_TABLET | Freq: Every day | ORAL | Status: DC
  Administered 2023-01-24 – 2023-01-26 (×3): 100 mg via ORAL
  Filled 2023-01-23 (×3): qty 1

## 2023-01-23 MED ORDER — ROCURONIUM BROMIDE 10 MG/ML (PF) SYRINGE
PREFILLED_SYRINGE | INTRAVENOUS | Status: AC
  Filled 2023-01-23: qty 10

## 2023-01-23 MED ORDER — BISACODYL 5 MG PO TBEC
10.0000 mg | DELAYED_RELEASE_TABLET | Freq: Every day | ORAL | Status: DC
  Administered 2023-01-23 – 2023-01-26 (×4): 10 mg via ORAL
  Filled 2023-01-23 (×4): qty 2

## 2023-01-23 MED ORDER — DEXMEDETOMIDINE HCL IN NACL 80 MCG/20ML IV SOLN
INTRAVENOUS | Status: DC | PRN
  Administered 2023-01-23 (×2): 10 ug via INTRAVENOUS

## 2023-01-23 MED ORDER — FENTANYL CITRATE (PF) 100 MCG/2ML IJ SOLN
25.0000 ug | INTRAMUSCULAR | Status: DC | PRN
  Administered 2023-01-23 (×2): 25 ug via INTRAVENOUS
  Administered 2023-01-23: 50 ug via INTRAVENOUS

## 2023-01-23 MED ORDER — SODIUM CHLORIDE 0.9% IV SOLUTION
INTRAVENOUS | Status: AC | PRN
  Administered 2023-01-23: 1000 mL

## 2023-01-23 MED ORDER — 0.9 % SODIUM CHLORIDE (POUR BTL) OPTIME
TOPICAL | Status: DC | PRN
  Administered 2023-01-23: 1000 mL

## 2023-01-23 MED ORDER — CEFAZOLIN SODIUM-DEXTROSE 2-4 GM/100ML-% IV SOLN
2.0000 g | Freq: Three times a day (TID) | INTRAVENOUS | Status: AC
  Administered 2023-01-23 (×2): 2 g via INTRAVENOUS
  Filled 2023-01-23 (×2): qty 100

## 2023-01-23 MED ORDER — LACTATED RINGERS IV SOLN: INTRAVENOUS | Status: DC

## 2023-01-23 MED ORDER — DEXAMETHASONE SODIUM PHOSPHATE 10 MG/ML IJ SOLN
INTRAMUSCULAR | Status: DC | PRN
  Administered 2023-01-23: 10 mg via INTRAVENOUS

## 2023-01-23 MED ORDER — ORAL CARE MOUTH RINSE: 15.0000 mL | Freq: Once | OROMUCOSAL | Status: AC

## 2023-01-23 MED ORDER — PANTOPRAZOLE SODIUM 40 MG PO TBEC
40.0000 mg | DELAYED_RELEASE_TABLET | Freq: Every day | ORAL | Status: DC
  Administered 2023-01-24 – 2023-01-26 (×3): 40 mg via ORAL
  Filled 2023-01-23 (×3): qty 1

## 2023-01-23 MED ORDER — MIDAZOLAM HCL 2 MG/2ML IJ SOLN
INTRAMUSCULAR | Status: AC
  Filled 2023-01-23: qty 2

## 2023-01-23 MED ORDER — ALBUTEROL SULFATE (2.5 MG/3ML) 0.083% IN NEBU
2.5000 mg | INHALATION_SOLUTION | RESPIRATORY_TRACT | Status: DC
  Administered 2023-01-23: 2.5 mg via RESPIRATORY_TRACT
  Filled 2023-01-23: qty 3

## 2023-01-23 MED ORDER — ENOXAPARIN SODIUM 40 MG/0.4ML IJ SOSY
40.0000 mg | PREFILLED_SYRINGE | Freq: Every day | INTRAMUSCULAR | Status: DC
  Administered 2023-01-23 – 2023-01-26 (×4): 40 mg via SUBCUTANEOUS
  Filled 2023-01-23 (×4): qty 0.4

## 2023-01-23 MED ORDER — SODIUM CHLORIDE FLUSH 0.9 % IV SOLN
INTRAVENOUS | Status: DC | PRN
  Administered 2023-01-23: 100 mL

## 2023-01-23 MED ORDER — SENNOSIDES-DOCUSATE SODIUM 8.6-50 MG PO TABS
1.0000 | ORAL_TABLET | Freq: Every day | ORAL | Status: DC
  Administered 2023-01-23 – 2023-01-25 (×3): 1 via ORAL
  Filled 2023-01-23 (×3): qty 1

## 2023-01-23 MED ORDER — ADULT MULTIVITAMIN W/MINERALS CH
1.0000 | ORAL_TABLET | Freq: Every day | ORAL | Status: DC
  Administered 2023-01-24 – 2023-01-26 (×3): 1 via ORAL
  Filled 2023-01-23 (×3): qty 1

## 2023-01-23 MED ORDER — GABAPENTIN 300 MG PO CAPS
300.0000 mg | ORAL_CAPSULE | Freq: Every day | ORAL | Status: AC
  Administered 2023-01-23 – 2023-01-25 (×3): 300 mg via ORAL
  Filled 2023-01-23 (×3): qty 1

## 2023-01-23 MED ORDER — ACETAMINOPHEN 160 MG/5ML PO SOLN: 1000.0000 mg | Freq: Four times a day (QID) | ORAL | Status: DC

## 2023-01-23 MED ORDER — TRAMADOL HCL 50 MG PO TABS
50.0000 mg | ORAL_TABLET | Freq: Four times a day (QID) | ORAL | Status: DC | PRN
  Administered 2023-01-26: 100 mg via ORAL
  Filled 2023-01-23: qty 2

## 2023-01-23 MED ORDER — SUGAMMADEX SODIUM 200 MG/2ML IV SOLN
INTRAVENOUS | Status: DC | PRN
  Administered 2023-01-23: 200 mg via INTRAVENOUS

## 2023-01-23 SURGICAL SUPPLY — 87 items
ADH SKN CLS APL DERMABOND .7 (GAUZE/BANDAGES/DRESSINGS) ×2
BAG SPEC RTRVL C1550 15 (MISCELLANEOUS) ×1
CANISTER SUCT 3000ML PPV (MISCELLANEOUS) ×2 IMPLANT
CANNULA REDUCER 12-8 DVNC XI (CANNULA) ×2 IMPLANT
CNTNR URN SCR LID CUP LEK RST (MISCELLANEOUS) ×5 IMPLANT
CONN ST 1/4X3/8 BEN (MISCELLANEOUS) IMPLANT
CONT SPEC 4OZ STRL OR WHT (MISCELLANEOUS) ×7
DEFOGGER SCOPE WARMER CLEARIFY (MISCELLANEOUS) ×1 IMPLANT
DERMABOND ADVANCED .7 DNX12 (GAUZE/BANDAGES/DRESSINGS) ×1 IMPLANT
DRAIN CHANNEL 28F RND 3/8 FF (WOUND CARE) IMPLANT
DRAIN CHANNEL 32F RND 10.7 FF (WOUND CARE) IMPLANT
DRAPE ARM DVNC X/XI (DISPOSABLE) ×4 IMPLANT
DRAPE COLUMN DVNC XI (DISPOSABLE) ×1 IMPLANT
DRAPE CV SPLIT W-CLR ANES SCRN (DRAPES) ×1 IMPLANT
DRAPE HALF SHEET 40X57 (DRAPES) ×1 IMPLANT
DRAPE INCISE IOBAN 66X45 STRL (DRAPES) IMPLANT
DRAPE ORTHO SPLIT 77X108 STRL (DRAPES) ×1
DRAPE SURG ORHT 6 SPLT 77X108 (DRAPES) ×1 IMPLANT
ELECT BLADE 6.5 EXT (BLADE) ×1 IMPLANT
ELECT REM PT RETURN 9FT ADLT (ELECTROSURGICAL) ×1
ELECTRODE REM PT RTRN 9FT ADLT (ELECTROSURGICAL) ×1 IMPLANT
FORCEPS BPLR FENES DVNC XI (FORCEP) IMPLANT
FORCEPS BPLR LNG DVNC XI (INSTRUMENTS) IMPLANT
GAUZE KITTNER 4X5 RF (MISCELLANEOUS) ×2 IMPLANT
GAUZE SPONGE 4X4 12PLY STRL (GAUZE/BANDAGES/DRESSINGS) ×1 IMPLANT
GAUZE SPONGE 4X4 12PLY STRL LF (GAUZE/BANDAGES/DRESSINGS) IMPLANT
GLOVE SS BIOGEL STRL SZ 7.5 (GLOVE) ×2 IMPLANT
GLOVE SURG POLYISO LF SZ8 (GLOVE) ×1 IMPLANT
GOWN STRL REUS W/ TWL LRG LVL3 (GOWN DISPOSABLE) ×2 IMPLANT
GOWN STRL REUS W/ TWL XL LVL3 (GOWN DISPOSABLE) ×2 IMPLANT
GOWN STRL REUS W/TWL 2XL LVL3 (GOWN DISPOSABLE) ×1 IMPLANT
GOWN STRL REUS W/TWL LRG LVL3 (GOWN DISPOSABLE) ×3
GOWN STRL REUS W/TWL XL LVL3 (GOWN DISPOSABLE) ×3
GRASPER TIP-UP FEN DVNC XI (INSTRUMENTS) IMPLANT
HEMOSTAT SURGICEL 2X14 (HEMOSTASIS) ×3 IMPLANT
IRRIGATION STRYKERFLOW (MISCELLANEOUS) ×1 IMPLANT
IRRIGATOR STRYKERFLOW (MISCELLANEOUS) ×1
KIT BASIN OR (CUSTOM PROCEDURE TRAY) ×1 IMPLANT
KIT SUCTION CATH 14FR (SUCTIONS) IMPLANT
KIT TURNOVER KIT B (KITS) ×1 IMPLANT
NDL HYPO 25GX1X1/2 BEV (NEEDLE) ×1 IMPLANT
NDL SPNL 22GX3.5 QUINCKE BK (NEEDLE) ×1 IMPLANT
NEEDLE HYPO 25GX1X1/2 BEV (NEEDLE) ×1 IMPLANT
NEEDLE SPNL 22GX3.5 QUINCKE BK (NEEDLE) ×1 IMPLANT
NS IRRIG 1000ML POUR BTL (IV SOLUTION) ×2 IMPLANT
PACK CHEST (CUSTOM PROCEDURE TRAY) ×1 IMPLANT
PAD ARMBOARD 7.5X6 YLW CONV (MISCELLANEOUS) ×2 IMPLANT
PORT ACCESS TROCAR AIRSEAL 12 (TROCAR) ×1 IMPLANT
RELOAD STAPLE 45 2.5 WHT DVNC (STAPLE) IMPLANT
RELOAD STAPLE 45 3.5 BLU DVNC (STAPLE) IMPLANT
RELOAD STAPLE 45 4.3 GRN DVNC (STAPLE) IMPLANT
RELOAD STAPLER 2.5X45 WHT DVNC (STAPLE) ×3 IMPLANT
RELOAD STAPLER 3.5X45 BLU DVNC (STAPLE) ×3 IMPLANT
RELOAD STAPLER 4.3X45 GRN DVNC (STAPLE) ×3 IMPLANT
SCISSORS LAP 5X35 DISP (ENDOMECHANICALS) IMPLANT
SEAL UNIV 5-12 XI (MISCELLANEOUS) ×4 IMPLANT
SET TRI-LUMEN FLTR TB AIRSEAL (TUBING) ×1 IMPLANT
SOL ELECTROSURG ANTI STICK (MISCELLANEOUS) ×1
SOLUTION ELECTROSURG ANTI STCK (MISCELLANEOUS) ×1 IMPLANT
SPONGE INTESTINAL PEANUT (DISPOSABLE) IMPLANT
SPONGE TONSIL 1 RF SGL (DISPOSABLE) IMPLANT
STAPLER 45 SUREFORM CVD DVNC (STAPLE) IMPLANT
STAPLER RELOAD 2.5X45 WHT DVNC (STAPLE) ×3
STAPLER RELOAD 3.5X45 BLU DVNC (STAPLE) ×3
STAPLER RELOAD 4.3X45 GRN DVNC (STAPLE) ×3
SUT PROLENE 4 0 RB 1 (SUTURE)
SUT PROLENE 4-0 RB1 .5 CRCL 36 (SUTURE) IMPLANT
SUT SILK 1 MH (SUTURE) ×1 IMPLANT
SUT SILK 2 0 SH (SUTURE) ×1 IMPLANT
SUT SILK 2 0SH CR/8 30 (SUTURE) IMPLANT
SUT SILK 3 0SH CR/8 30 (SUTURE) IMPLANT
SUT VIC AB 1 CTX 36 (SUTURE) ×1
SUT VIC AB 1 CTX36XBRD ANBCTR (SUTURE) ×1 IMPLANT
SUT VIC AB 2-0 CTX 27 (SUTURE) IMPLANT
SUT VIC AB 2-0 CTX 36 (SUTURE) ×1 IMPLANT
SUT VIC AB 3-0 X1 27 (SUTURE) ×2 IMPLANT
SUT VICRYL 0 TIES 12 18 (SUTURE) ×1 IMPLANT
SUT VICRYL 0 UR6 27IN ABS (SUTURE) ×2 IMPLANT
SUT VICRYL 2 TP 1 (SUTURE) IMPLANT
SYR 20CC LL (SYRINGE) ×2 IMPLANT
SYSTEM RETRIEVAL ANCHOR 15 (MISCELLANEOUS) IMPLANT
SYSTEM SAHARA CHEST DRAIN ATS (WOUND CARE) ×1 IMPLANT
TAPE CLOTH 4X10 WHT NS (GAUZE/BANDAGES/DRESSINGS) ×1 IMPLANT
TAPE CLOTH SURG 4X10 WHT LF (GAUZE/BANDAGES/DRESSINGS) IMPLANT
TOWEL GREEN STERILE (TOWEL DISPOSABLE) ×1 IMPLANT
TRAY FOLEY MTR SLVR 16FR STAT (SET/KITS/TRAYS/PACK) ×1 IMPLANT
WATER STERILE IRR 1000ML POUR (IV SOLUTION) ×2 IMPLANT

## 2023-01-23 NOTE — Anesthesia Procedure Notes (Signed)
Arterial Line Insertion Start/End9/19/2024 7:05 AM, 01/23/2023 7:10 AM Performed by: Waynard Edwards, CRNA, CRNA  Patient location: Pre-op. Preanesthetic checklist: patient identified, IV checked, site marked, risks and benefits discussed, surgical consent, monitors and equipment checked, pre-op evaluation, timeout performed and anesthesia consent Lidocaine 1% used for infiltration Left, radial was placed Catheter size: 20 G Hand hygiene performed , maximum sterile barriers used  and Seldinger technique used Allen's test indicative of satisfactory collateral circulation Attempts: 1 Procedure performed without using ultrasound guided technique. Following insertion, dressing applied and Biopatch. Post procedure assessment: normal and unchanged  Patient tolerated the procedure well with no immediate complications.

## 2023-01-23 NOTE — Hospital Course (Addendum)
  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   Allison Harper is a 52 year old woman with a history of breast cancer, peripheral neuropathy, and carcinoid tumors of the lung.  Diagnosed with breast cancer in 2023.  As part of her workup she had a CT of the chest which showed to left lower lobe lung nodules.  The nodules were not hypermetabolic on FDG PET.  Bronchoscopy and biopsy showed low-grade carcinoid tumor.   She completed therapy for her breast cancer.  She then had a dotatate PET which showed low-grade activity in the left lower lobe lung nodules.   She feels well.  No chest pain, pressure, tightness, or shortness of breath.  Very anxious about the surgery.  Very anxious about pain management.  Dr. Dorris Fetch reviewed the patient and relevant studies and recommended robotic assisted resection.  Was admitted this hospitalization for the procedure.  Hospital course: The patient was admitted electively on 01/23/2023 and taken to the operating room at which time she underwent a robotic assisted left lower lobe lobectomy.  Additionally she had lymph node sampling and a intercostal nerve block.  She tolerated the procedure well and was taken to the postanesthesia care unit in stable condition.  Post operative Hospital course:  The patient has done well.  She has maintained stable vital signs and normal sinus rhythm.  Oxygen is being weaned in the usual manner with routine pulmonary hygiene and incentive spirometry.  Chest tubes drainage has been minor.  On postop day 1 she did have a small air leak.  Chest tube was kept to waterseal.  Daily chest x rays were obtained and remained stable. Chest tube was removed on 09/21. Follow-up chest x-ray revealed***.  Blood sugars were elevated and she was placed on a sliding scale.  She has previously been on Glucophage but has not taken recently;this has been resumed.  Will  need primary care follow-up regarding diabetes.  Incisions are healing well without evidence of infection.  She is tolerating increasing activities using standard rehab modalities.  Renal function has remained within normal limits.  She has a mild expected acute blood loss anemia which is being monitored clinically.

## 2023-01-23 NOTE — Transfer of Care (Signed)
Immediate Anesthesia Transfer of Care Note  Patient: Allison Harper  Procedure(s) Performed: XI ROBOTIC ASSISTED THORACOSCOPY-LEFT LOWER LOBECTOMY (Left: Chest) Intercostal Nerve Block (Left: Chest) REMOVAL PORT-A-CATH (Left: Chest) LYMPH NODE BIOPSY (Left: Chest)  Patient Location: PACU  Anesthesia Type:General  Level of Consciousness: drowsy  Airway & Oxygen Therapy: Patient Spontanous Breathing and Patient connected to face mask oxygen  Post-op Assessment: Report given to RN and Post -op Vital signs reviewed and stable  Post vital signs: Reviewed and stable  Last Vitals:  Vitals Value Taken Time  BP 134/74 01/23/23 1245  Temp 36.4 C 01/23/23 1237  Pulse 80 01/23/23 1300  Resp 18 01/23/23 1259  SpO2 93 % 01/23/23 1300  Vitals shown include unfiled device data.  Last Pain:  Vitals:   01/23/23 1237  TempSrc:   PainSc: Asleep         Complications: No notable events documented.

## 2023-01-23 NOTE — Plan of Care (Signed)
  Problem: Pain Managment: Goal: General experience of comfort will improve Outcome: Progressing   Problem: Safety: Goal: Ability to remain free from injury will improve Outcome: Progressing   Problem: Skin Integrity: Goal: Risk for impaired skin integrity will decrease Outcome: Progressing   

## 2023-01-23 NOTE — Anesthesia Procedure Notes (Signed)
Procedure Name: Intubation Date/Time: 01/23/2023 8:17 AM  Performed by: Camillia Herter, CRNAPre-anesthesia Checklist: Patient identified, Emergency Drugs available, Suction available and Patient being monitored Patient Re-evaluated:Patient Re-evaluated prior to induction Oxygen Delivery Method: Circle system utilized Preoxygenation: Pre-oxygenation with 100% oxygen Induction Type: IV induction Ventilation: Mask ventilation without difficulty Laryngoscope Size: Mac and 3 Grade View: Grade I Endobronchial tube: Left, Double lumen EBT, EBT position confirmed by fiberoptic bronchoscope and EBT position confirmed by auscultation and 37 Fr Number of attempts: 1 Airway Equipment and Method: Stylet Placement Confirmation: ETT inserted through vocal cords under direct vision, positive ETCO2 and breath sounds checked- equal and bilateral Secured at: 27 cm Tube secured with: Tape Dental Injury: Teeth and Oropharynx as per pre-operative assessment

## 2023-01-23 NOTE — Interval H&P Note (Signed)
History and Physical Interval Note:  01/23/2023 7:40 AM  Allison Harper  has presented today for surgery, with the diagnosis of LLL CARCINOID TUMOR.  The various methods of treatment have been discussed with the patient and family. After consideration of risks, benefits and other options for treatment, the patient has consented to  Procedure(s): XI ROBOTIC ASSISTED THORACOSCOPY-LEFT LOWER LOBECTOMY (Left) possible XI ROBOTIC ASSISTED THORACOSCOPY DIAPHRAGMATIC PLICATION (Left) as a surgical intervention.  The patient's history has been reviewed, patient examined, no change in status, stable for surgery.  I have reviewed the patient's chart and labs.  Questions were answered to the patient's satisfaction.     Loreli Slot

## 2023-01-23 NOTE — Progress Notes (Signed)
Pt arrived to room 2c08 from PACU. See assessment.

## 2023-01-23 NOTE — Plan of Care (Signed)
Problem: Education: Goal: Knowledge of disease or condition will improve Outcome: Progressing Goal: Knowledge of the prescribed therapeutic regimen will improve Outcome: Progressing   Problem: Activity: Goal: Risk for activity intolerance will decrease Outcome: Progressing   Problem: Cardiac: Goal: Will achieve and/or maintain hemodynamic stability Outcome: Progressing   Problem: Clinical Measurements: Goal: Postoperative complications will be avoided or minimized Outcome: Progressing   Problem: Respiratory: Goal: Respiratory status will improve Outcome: Progressing   Problem: Pain Management: Goal: Pain level will decrease Outcome: Progressing   Problem: Skin Integrity: Goal: Wound healing without signs and symptoms infection will improve Outcome: Progressing   Problem: Education: Goal: Knowledge of General Education information will improve Description: Including pain rating scale, medication(s)/side effects and non-pharmacologic comfort measures Outcome: Progressing   Problem: Health Behavior/Discharge Planning: Goal: Ability to manage health-related needs will improve Outcome: Progressing   Problem: Clinical Measurements: Goal: Ability to maintain clinical measurements within normal limits will improve Outcome: Progressing Goal: Will remain free from infection Outcome: Progressing Goal: Diagnostic test results will improve Outcome: Progressing Goal: Respiratory complications will improve Outcome: Progressing Goal: Cardiovascular complication will be avoided Outcome: Progressing   Problem: Activity: Goal: Risk for activity intolerance will decrease Outcome: Progressing   Problem: Nutrition: Goal: Adequate nutrition will be maintained Outcome: Progressing   Problem: Coping: Goal: Level of anxiety will decrease Outcome: Progressing   Problem: Elimination: Goal: Will not experience complications related to bowel motility Outcome: Progressing Goal:  Will not experience complications related to urinary retention Outcome: Progressing   Problem: Pain Managment: Goal: General experience of comfort will improve Outcome: Progressing

## 2023-01-23 NOTE — Brief Op Note (Addendum)
01/23/2023  11:37 AM  PATIENT:  Allison Harper  52 y.o. female  PRE-OPERATIVE DIAGNOSIS:  LEFT LOWER LOBE CARCINOID TUMORS  POST-OPERATIVE DIAGNOSIS:  LEFT LOWER LOBE CARCINOID TUMORS  PROCEDURE:  Procedure(s): XI ROBOTIC ASSISTED THORACOSCOPY-LEFT LOWER LOBECTOMY (Left) Intercostal Nerve Block (Left) REMOVAL PORT-A-CATH (Left)  SURGEON:  Surgeons and Role:    * Loreli Slot, MD - Primary  PHYSICIAN ASSISTANT: WAYNE GOLD PA-C   ANESTHESIA:   general  EBL:  100 ML  BLOOD ADMINISTERED:none  DRAINS:  28 F BLAKE DRAIN IN LEFT HEMITHORAX    LOCAL MEDICATIONS USED:  BUPIVICAINE  and  EXPAREL  SPECIMEN:  Source of Specimen:  LEFT LOWER LOBE AND LN SAMPLES  DISPOSITION OF SPECIMEN:  PATHOLOGY  COUNTS:  YES  TOURNIQUET:  * No tourniquets in log *  DICTATION: .Other Dictation: Dictation Number [id number]  PLAN OF CARE: Admit to inpatient   PATIENT DISPOSITION:  PACU - hemodynamically stable.   Delay start of Pharmacological VTE agent (>24hrs) due to surgical blood loss or risk of bleeding: no  COMPLICATIONS: NO KNOWN

## 2023-01-23 NOTE — Interval H&P Note (Signed)
History and Physical Interval Note: She also wishes to have her portacath removed- will do that today as well  01/23/2023 7:45 AM  Allison Harper  has presented today for surgery, with the diagnosis of LLL CARCINOID TUMOR.  The various methods of treatment have been discussed with the patient and family. After consideration of risks, benefits and other options for treatment, the patient has consented to  Procedure(s): XI ROBOTIC ASSISTED THORACOSCOPY-LEFT LOWER LOBECTOMY (Left) possible XI ROBOTIC ASSISTED THORACOSCOPY DIAPHRAGMATIC PLICATION (Left) as a surgical intervention.  The patient's history has been reviewed, patient examined, no change in status, stable for surgery.  I have reviewed the patient's chart and labs.  Questions were answered to the patient's satisfaction.     Loreli Slot

## 2023-01-24 ENCOUNTER — Encounter (HOSPITAL_COMMUNITY): Payer: Self-pay | Admitting: Thoracic Surgery (Cardiothoracic Vascular Surgery)

## 2023-01-24 ENCOUNTER — Inpatient Hospital Stay (HOSPITAL_COMMUNITY): Payer: 59

## 2023-01-24 LAB — GLUCOSE, CAPILLARY
Glucose-Capillary: 315 mg/dL — ABNORMAL HIGH (ref 70–99)
Glucose-Capillary: 222 mg/dL — ABNORMAL HIGH (ref 70–99)
Glucose-Capillary: 186 mg/dL — ABNORMAL HIGH (ref 70–99)
Glucose-Capillary: 165 mg/dL — ABNORMAL HIGH (ref 70–99)

## 2023-01-24 LAB — COMPREHENSIVE METABOLIC PANEL
ALT: 26 U/L (ref 0–44)
AST: 34 U/L (ref 15–41)
Albumin: 3.5 g/dL (ref 3.5–5.0)
Alkaline Phosphatase: 57 U/L (ref 38–126)
Anion gap: 14 (ref 5–15)
BUN: 10 mg/dL (ref 6–20)
CO2: 20 mmol/L — ABNORMAL LOW (ref 22–32)
Calcium: 8.7 mg/dL — ABNORMAL LOW (ref 8.9–10.3)
Chloride: 98 mmol/L (ref 98–111)
Creatinine, Ser: 0.87 mg/dL (ref 0.44–1.00)
GFR, Estimated: >60 mL/min (ref >60)
Glucose, Bld: 284 mg/dL — ABNORMAL HIGH (ref 70–99)
Potassium: 4.6 mmol/L (ref 3.5–5.1)
Sodium: 132 mmol/L — ABNORMAL LOW (ref 135–145)
Total Bilirubin: 0.4 mg/dL (ref 0.3–1.2)
Total Protein: 6.9 g/dL (ref 6.5–8.1)

## 2023-01-24 LAB — BASIC METABOLIC PANEL
Anion gap: 11 (ref 5–15)
BUN: 8 mg/dL (ref 6–20)
CO2: 18 mmol/L — ABNORMAL LOW (ref 22–32)
Calcium: 7.6 mg/dL — ABNORMAL LOW (ref 8.9–10.3)
Chloride: 98 mmol/L (ref 98–111)
Creatinine, Ser: 0.85 mg/dL (ref 0.44–1.00)
GFR, Estimated: >60 mL/min (ref >60)
Glucose, Bld: 780 mg/dL (ref 70–99)
Potassium: 4.1 mmol/L (ref 3.5–5.1)
Sodium: 126 mmol/L — ABNORMAL LOW (ref 135–145)

## 2023-01-24 LAB — CBC
HCT: 32.9 % — ABNORMAL LOW (ref 36.0–46.0)
Hemoglobin: 10.5 g/dL — ABNORMAL LOW (ref 12.0–15.0)
MCH: 28.5 pg (ref 26.0–34.0)
MCHC: 31.9 g/dL (ref 30.0–36.0)
MCV: 89.2 fL (ref 80.0–100.0)
Platelets: 185 10*3/uL (ref 150–400)
RBC: 3.69 MIL/uL — ABNORMAL LOW (ref 3.87–5.11)
RDW: 14 % (ref 11.5–15.5)
WBC: 11.3 10*3/uL — ABNORMAL HIGH (ref 4.0–10.5)
nRBC: 0 % (ref 0.0–0.2)

## 2023-01-24 MED ORDER — INSULIN ASPART 100 UNIT/ML IJ SOLN: 0.0000 [IU] | Freq: Every day | INTRAMUSCULAR | Status: DC

## 2023-01-24 MED ORDER — INSULIN ASPART 100 UNIT/ML IJ SOLN
0.0000 [IU] | Freq: Three times a day (TID) | INTRAMUSCULAR | Status: DC
  Administered 2023-01-24: 3 [IU] via SUBCUTANEOUS
  Administered 2023-01-24: 5 [IU] via SUBCUTANEOUS
  Administered 2023-01-25 – 2023-01-26 (×4): 3 [IU] via SUBCUTANEOUS

## 2023-01-24 MED ORDER — METOCLOPRAMIDE HCL 5 MG/ML IJ SOLN
10.0000 mg | Freq: Three times a day (TID) | INTRAMUSCULAR | Status: AC
  Administered 2023-01-24 (×3): 10 mg via INTRAVENOUS
  Filled 2023-01-24 (×3): qty 2

## 2023-01-24 MED ORDER — METFORMIN HCL ER 750 MG PO TB24
750.0000 mg | ORAL_TABLET | Freq: Every day | ORAL | Status: DC
  Administered 2023-01-25 – 2023-01-26 (×2): 750 mg via ORAL
  Filled 2023-01-24 (×2): qty 1

## 2023-01-24 MED ORDER — ALBUTEROL SULFATE (2.5 MG/3ML) 0.083% IN NEBU: 2.5000 mg | INHALATION_SOLUTION | RESPIRATORY_TRACT | Status: DC | PRN

## 2023-01-24 NOTE — Discharge Summary (Addendum)
Physician Discharge Summary       [address]       [name] [address]             [phone]    Patient ID: Allison Harper MRN: [id number] DOB/AGE: 1970/09/10 52 y.o.  Admit date: 01/23/2023 Discharge date: 01/26/2023  Admission Diagnoses: Low-grade neuroendocrine tumors of the lung  Discharge Diagnoses: Low-grade neuroendocrine tumors of the lung Principal Problem:   S/P partial lobectomy of lung Active Problems:   S/P lobectomy of lung   Consults: None  Procedure (s):  Xi robotic-assisted left lower lobectomy, intercostal nerve blocks, lymph node sampling and removal of Port-A-Cath by Dr. Dorris Fetch on 01/24/2023.  Pathology:Final result is pending     HPI: At time of thoracic surgical consultation  Ms. 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   Allison Harper is a 52 year old woman with a history of breast cancer, peripheral neuropathy, and carcinoid tumors of the lung.  Diagnosed with breast cancer in 2023.  As part of her workup she had a CT of the chest which showed to left lower lobe lung nodules.  The nodules were not hypermetabolic on FDG PET.  Bronchoscopy and biopsy showed low-grade carcinoid tumor.   She completed therapy for her breast cancer.  She then had a dotatate PET which showed low-grade activity in the left lower lobe lung nodules.   She feels well.  No chest pain, pressure, tightness, or shortness of breath.  Very anxious about the surgery.  Very anxious about pain management.  Dr. Dorris Fetch reviewed the patient and relevant studies and recommended robotic assisted resection.  Was admitted this hospitalization for the procedure.  Hospital course: The patient was admitted electively on 01/23/2023 and taken to the operating room at which time she underwent a robotic assisted left lower lobe lobectomy.  Additionally she had lymph node sampling and a intercostal nerve block.  She tolerated the  procedure well and was taken to the postanesthesia care unit in stable condition.  Post operative Hospital course:  The patient has done well.  She has maintained stable vital signs and normal sinus rhythm.  Oxygen is being weaned in the usual manner with routine pulmonary hygiene and incentive spirometry.  Chest tubes drainage has been minor.  On postop day 1 she did have a small air leak.  Chest tube was kept to waterseal.  Daily chest x rays were obtained and remained stable. Chest tube was removed on 09/21. Follow-up chest x-ray revealed small left apical pneumothorax.  Blood sugars were elevated and she was placed on a sliding scale.  She has previously been on Glucophage but has not taken recently;this has been resumed.  HGA1C was 9.6. She will need primary care follow-up regarding diabetes.  We distressed the importance of obtaining medical doctor as soon as possible. Incisions are healing well without evidence of infection.  She is tolerating increasing activities using standard rehab modalities.  Renal function has remained within normal limits.  She has a mild expected acute blood loss anemia which is being monitored clinically. She has been tolerating a diet. She is ambulating with good oxygenation on room air. All wounds are clean, dry, healing without signs of infection. PA/LAT CXR this am shows stable, small left apical pneumothorax. She is stable for discharge today.     Latest Vital Signs: Blood pressure (!) 141/74, pulse 86, temperature 97.9 F (36.6 C), temperature source Oral, resp. rate 20, height 5'  9" (1.753 m), weight 77.6 kg, SpO2 96%.  Physical Exam: Cardiovascular: RRR Pulmonary: Clear to auscultation  Abdomen: Soft, non tender, bowel sounds present. Extremities: No lower extremity edema. Wounds: Clean and dry.  No erythema or signs of infection.  Discharge Condition:Stable  Recent laboratory studies:  Lab Results  Component Value Date   WBC 11.4 (H) 01/25/2023    HGB 10.9 (L) 01/25/2023   HCT 34.3 (L) 01/25/2023   MCV 87.1 01/25/2023   PLT 215 01/25/2023   Lab Results  Component Value Date   NA 130 (L) 01/25/2023   K 4.5 01/25/2023   CL 98 01/25/2023   CO2 21 (L) 01/25/2023   CREATININE 0.93 01/25/2023   GLUCOSE 199 (H) 01/25/2023      Diagnostic Studies: DG Chest 2 View  Result Date: 01/26/2023 CLINICAL DATA:  [id number] Pneumothorax on left [id number]. EXAM: CHEST - 2 VIEW COMPARISON:  01/25/2023. FINDINGS: Redemonstration of small left apical pneumothorax, essentially unchanged when compared to the prior exam. There are persistent patchy areas of increased interstitial markings throughout bilateral lungs. Stable postsurgical changes in the left lung with elevated left hemidiaphragm. No acute consolidation or major lung collapse. Bilateral costophrenic angles are clear. Stable cardio-mediastinal silhouette. No acute osseous abnormalities. The soft tissues are within normal limits. IMPRESSION: *Unchanged small left apical pneumothorax. Electronically Signed   By: Jules Schick M.D.   On: 01/26/2023 08:15   DG Chest Port 1 View  Result Date: 01/25/2023 CLINICAL DATA:  Chest tube removal EXAM: PORTABLE CHEST 1 VIEW COMPARISON:  9:23 a.m. FINDINGS: Interval left chest tube removal. Small left apical pneumothorax appears stable. Status post partial left lung resection with stable elevation of the left hemidiaphragm. Lung volumes are small. No pneumothorax on the right. No pleural effusion. Cardiac size within normal limits. Pulmonary vascularity is normal. No acute bone abnormality. Surgical clips noted within the right axilla. IMPRESSION: 1. Interval left chest tube removal. Stable small left apical pneumothorax. 2. Stable postsurgical changes. Electronically Signed   By: Helyn Numbers M.D.   On: 01/25/2023 17:59   DG CHEST PORT 1 VIEW  Result Date: 01/25/2023 CLINICAL DATA:  Pneumothorax EXAM: PORTABLE CHEST 1 VIEW COMPARISON:  Chest radiograph 1 day prior  FINDINGS: The left apical chest tube is stable in position The cardiomediastinal silhouette is stable There is unchanged asymmetric elevation of the left hemidiaphragm. A small left pneumothorax is again seen in the medial left hemithorax, unchanged. There is no new or worsening focal airspace disease. There is no pleural effusion there is no right pneumothorax There is no acute osseous abnormality. IMPRESSION: Unchanged small left pneumothorax with chest tube in place. Electronically Signed   By: Lesia Hausen M.D.   On: 01/25/2023 10:29   DG Chest Port 1 View  Result Date: 01/24/2023 CLINICAL DATA:  Status post lobectomy of lung. EXAM: PORTABLE CHEST 1 VIEW COMPARISON:  Radiograph yesterday. FINDINGS: Postsurgical change in the left hemithorax with chain sutures and volume loss. Small pneumothorax is seen medially in the upper lung zone. Left chest tube remains in place directed towards the apex. Again seen subcutaneous emphysema in the left chest wall. No focal right lung opacity. Stable heart size and mediastinal contours. No large pleural effusion. There is mild gaseous gastric distension. IMPRESSION: Postsurgical change in the left hemithorax. Small pneumothorax with left chest tube in place. Electronically Signed   By: Narda Rutherford M.D.   On: 01/24/2023 08:48   DG Chest 2 View  Result Date: 01/23/2023 CLINICAL DATA:  Preoperative assessment for left lower lobectomy, history of carcinoid tumor EXAM: CHEST - 2 VIEW COMPARISON:  01/14/2023 FINDINGS: Frontal and lateral views of the chest demonstrate left chest wall port tip in the superior vena cava. Cardiac silhouette is unremarkable. 1.3 cm left lower lobe nodule corresponds to largest nodule on preceding CT. The smaller nodules within the left lower lobe and right lower lobe seen on prior CT are not well visualized by x-ray. No acute airspace disease, effusion, or pneumothorax. Postsurgical changes from right mastectomy. IMPRESSION: 1. 1.3 cm left  lower lobe nodule corresponding to CT findings. The smaller nodule seen in the bilateral lower lobes on prior CT are not apparent by x-ray. Electronically Signed   By: Sharlet Salina M.D.   On: 01/23/2023 17:07   DG Chest Port 1 View  Result Date: 01/23/2023 CLINICAL DATA:  Left lower lobectomy, history of carcinoid tumor EXAM: PORTABLE CHEST 1 VIEW COMPARISON:  01/23/2023 FINDINGS: Single frontal view of the chest demonstrates stable enlargement the cardiac silhouette. Volume loss left hemithorax and elevation of the left hemidiaphragm consistent with left lower lobectomy. Left chest tube unchanged. Lung volumes are diminished, with no airspace disease, effusion, or pneumothorax. Subcutaneous gas is seen within the left chest wall, tracking into the left supraclavicular region. Interval removal of the left chest wall port. No acute bony abnormalities. IMPRESSION: 1. Postsurgical changes from left lower lobectomy. 2. Stable left chest tube, with continued subcutaneous gas in the left chest wall consistent with recent surgical intervention. No evidence of pneumothorax. Electronically Signed   By: Sharlet Salina M.D.   On: 01/23/2023 17:05   DG Chest 1 View  Result Date: 01/23/2023 CLINICAL DATA:  Rule out foreign body EXAM: CHEST  1 VIEW COMPARISON:  None Available. FINDINGS: ETT is in the left mainstem bronchus. Left-sided chest tube. Cholecystectomy clips. No large pleural effusion or evidence of pneumothorax. Postsurgical changes of left lobectomy. No evidence of metallic foreign body overlying the region of the chest. Small linear metallic staples overlying the upper right hemithorax. A few nonspecific hyperdensities are seen in the left breast soft tissues. IMPRESSION: 1. No evidence of metallic foreign body overlying the region of the chest. 2. A few nonspecific hyperdensities are seen in the left breast soft tissues. 3. ETT is in the left mainstem bronchus. Results were called by telephone at the time  of interpretation on 01/23/2023 at 12:26 pm to OR. Electronically Signed   By: Allegra Lai M.D.   On: 01/23/2023 12:29   CT CHEST WO CONTRAST  Addendum Date: 01/15/2023   ADDENDUM REPORT: 01/15/2023 08:27 ADDENDUM: Corrected report: Original report was generated with an error due to voice recognition software in findings section, corrected as follows: on series 5, image 77, left LOWER lobe solid nodule measuring 12 x 9 mm on image 49, and left lower lobe pulmonary nodule measuring 8 x 8 mm on image 26. Electronically Signed   By: Allegra Lai M.D.   On: 01/15/2023 08:27   Result Date: 01/15/2023 CLINICAL DATA:  Carcinoid tumor of the left lung; * Tracking Code: BO * EXAM: CT CHEST WITHOUT CONTRAST TECHNIQUE: Multidetector CT imaging of the chest was performed following the standard protocol without IV contrast. RADIATION DOSE REDUCTION: This exam was performed according to the departmental dose-optimization program which includes automated exposure control, adjustment of the mA and/or kV according to patient size and/or use of iterative reconstruction technique. COMPARISON:  PET-CT dated Sep 12, 2018 FINDINGS: Cardiovascular: Normal heart size. No pericardial effusion.  Normal caliber thoracic aorta with mild calcified plaque. No visible coronary artery calcifications. Left chest wall port with tip near the superior cavoatrial junction. Mediastinum/Nodes: Esophagus and thyroid are unremarkable. Surgical clips of the right axilla. No enlarged lymph nodes seen in the chest. Lungs/Pleura: Central airways are patent. Mosaic attenuation. Mild subpleural reticular opacities of the right upper lobe, likely postradiation change. Stable bilateral solid pulmonary nodules. Right lower lobe solid pulmonary nodule measuring 4 mm (not as well seen on prior due to slice thickness) on series 5, image 77, left upper lobe solid nodule measuring 12 x 9 mm on image 49, and left lower lobe pulmonary nodule measuring 8 x 8 mm  on image 26. Upper Abdomen: Prior cholecystectomy.  Hepatic steatosis. Musculoskeletal: Prior right mastectomy. Asymmetric left breast soft tissue with associated biopsy marking clip, correlate with prior mammography. No aggressive appearing osseous lesions. IMPRESSION: 1. Stable bilateral solid pulmonary nodules, largest are located in the left lower lobe. Nodules demonstrated mild tracer uptake on prior Dotatate PET, consistent with well-differentiated neuroendocrine tumor. 2. 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 3. Hepatic steatosis. 4. Aortic Atherosclerosis (ICD10-I70.0). Electronically Signed: By: Allegra Lai M.D. On: 01/14/2023 10:35         Discharge Medications: Allergies as of 01/26/2023       Reactions   Copper Cu 64 Dotatate Diarrhea, Nausea Only, Rash, Other (See Comments)   Chest pain   Hydrocodone-acetaminophen Itching, Rash        Medication List     TAKE these medications    allopurinol 100 MG tablet Commonly known as: ZYLOPRIM Take 100 mg by mouth daily.   colchicine 0.6 MG tablet Take 1 tablet (0.6 mg total) by mouth daily as needed (gout flares).   gabapentin 300 MG capsule Commonly known as: NEURONTIN Take 1 capsule (300 mg total) by mouth at bedtime.   metFORMIN 750 MG 24 hr tablet Commonly known as: GLUCOPHAGE-XR Take 1 tablet (750 mg total) by mouth daily with breakfast.   multivitamin with minerals tablet Take 1 tablet by mouth daily.   OVER THE COUNTER MEDICATION Take 2 capsules by mouth daily. [place] Tail Mushrooms   oxyCODONE 5 MG immediate release tablet Commonly known as: Oxy IR/ROXICODONE Take 1 tablet (5 mg total) by mouth every 6 (six) hours as needed for severe pain.   Slow Magnesium/Calcium 70-117 MG Tbec Generic drug: Magnesium Cl-Calcium Carbonate Take 1 tablet by mouth 2 (two) times  daily.        Follow Up Appointments:  Follow-up Information     Loreli Slot, MD Follow up.   Specialty: Cardiothoracic Surgery Why: Please see discharge paperwork for details of follow-up appointment with Dr. Dorris Fetch. Contact information: [address] [employer] [address] [place] [address] [phone]         [facility] Follow up.   Why: On the date you are scheduled to see Dr. Dorris Fetch in the office please obtain a chest x-ray at [facility] 1 hour prior to his appointment. Contact information: [address] [place] [address]        PCP Follow up.   Why: Please obtain a PCP for further diabetes management and surveillance of HGA1C 9.6                Signed: Donielle M ZimmermanPA-C 01/27/2023, 8:11 AM

## 2023-01-24 NOTE — Op Note (Signed)
NAMEJAZ, MARKWELL MEDICAL RECORD NO: [id number] ACCOUNT NO: [id number] DATE OF BIRTH: [birth year] FACILITY: [facility] LOCATION: [facility] PHYSICIAN: Salvatore Decent. 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  PROCEDURE:   Xi robotic-assisted left lower lobectomy, Lymph node sampling, Intercostal nerve blocks,  and  Removal of Port-A-Cath.  SURGEON:  Salvatore Decent. Dorris Fetch, MD  ASSISTANT:  Gershon Crane, PA-C.  ANESTHESIA:  General.  FINDINGS:  Unable to perform a wedge resection of the central carcinoid with an adequate margin.  Therefore, lobectomy performed.  Fracture of robotic instrument necessitated a chest x-ray, no foreign body seen.  Port-A-Cath removed intact without difficulty.  CLINICAL NOTE: Ms. Legacy is a 52 year old woman with a history of breast cancer and recently discovered carcinoid tumors of the lung.  She completed therapy for her breast cancer and wished to have the carcinoid tumors removed.  These showed low-grade activity on PET.  She understood the nodules were likely benign, but wished to have resection regardless.  The indications, risks, benefits, and alternatives were discussed in detail with the patient.  She understood and accepted the risks and agreed to proceed.  She was noted to have an elevated left hemidiaphragm on x-ray, but a sniff test demonstrated the diaphragm was functioning and not paralyzed.  OPERATIVE NOTE: This Piel was brought to the preoperative holding area on 01/23/2023. The anesthesia service established intravenous access and placed an arterial blood pressure monitoring line.  She was taken to the operating room and anesthetized and intubated with a double lumen endotracheal tube.  Intravenous antibiotics were administered.  Sequential compression devices were placed on the calves for DVT  prophylaxis and a Foley catheter was placed.  She was placed in a right lateral decubitus position.  A Bair hugger was placed for active warming.   The left chest was prepped and draped in the usual sterile fashion.  Single lung ventilation of the right lung was initiated and was tolerated well throughout the procedure.  A timeout was performed.  A solution containing 20 mL of liposomal bupivacaine, 30 mL of 0.5% bupivacaine and 50 mL of saline was prepared.  This solution was used for local at the incision sites as well as for the intercostal nerve blocks, and also used for local at the site of the Port-A-Cath removal.  An incision was made in the eighth interspace in approximately the midaxillary line, an 8 mm robotic port was inserted.  The thoracoscope was advanced into the chest.  After confirming intrapleural placement, carbon dioxide was insufflated per protocol.  A 12 mm port was placed in the eighth interspace anterior to the camera port.  Intercostal nerve blocks then were performed from the third to the tenth interspace injecting 10 mL of the bupivacaine solution into a subpleural plane at each level.  A 12 mm AirSeal port was placed in the tenth interspace posterolaterally.  Two additional eighth interspace robotic ports were placed, the robot was deployed.  The camera arm was docked, and targeting was performed.  The remaining arms were docked.  The robotic instruments were inserted with thoracoscopic visualization.  The anatomy was a little distorted as the lung was pushed superiorly and medially due to the eventration of the left hemidiaphragm.  The inferior pulmonary ligament was divided using bipolar cautery. Lymph nodes were sampled where possible, but there was a paucity of lymph  nodes in the usual locations, possibly related to her previous chemotherapy.  The pleural reflection was divided at the hilum posteriorly.  The lung was retracted inferiorly and the pleural reflection was divided  superiorly at the hilum.  A level 5 node was removed anteriorly.  The phrenic nerve was identified and no cautery was used in its vicinity.  The fissure then was inspected.  There were no lymph nodes of note in the fissure. The pulmonary artery was identified.  A dissection plane was developed over that and the fissure was completed using the robotic stapler.  At this point, and attempt was made to identify the nodules.  The more central, medial, larger nodule was visualized, but there was no way to resect it with an adequate margin without sacrificing the pulmonary artery branches.  Therefore, the decision was made to proceed with lobectomy as discussed with the patient preoperatively.  The inferior pulmonary vein was encircled and divided with the robotic stapler.  Next, the  superior segmental pulmonary artery branches were encircled and divided followed by the basilar segmental vessels.  The stapler was placed across the left lower lobe bronchus at its origin and closed.  A test inflation showed good aeration of the upper lobe.  The stapler was fired transecting the lower lobe bronchus.  The vessel loop and sponges placed during the dissection were removed.  The chest was copiously irrigated with saline.  A test inflation to 30 cm water revealed good aeration of the left upper lobe with no apparent air leaks.  The robotic instruments were removed.  The robot was undocked.  The anterior eighth interspace incision was lengthened to approximately 4 cm.  A 15 mm endoscopic retrieval bag was placed into the chest.  The lower lobe was manipulated into the bag and then removed.  No frozen section was done as the nodules were clearly distinct and away from the margins. Both nodules were palpable in the specimen.  The thoracoscope was readvanced into the chest. All port sites and incisions were inspected for hemostasis as were the staple lines.  A 28-French Blake drain was placed through the original port incision  directed to the apex and secured with #1 Silk suture.  Dual lung ventilation was resumed.  The remaining incisions were closed in standard fashion.  The chest tube was placed to a Pleur-Evac on waterseal.  She was placed back in a supine position.  She was extubated in the operating room and taken to the postanesthetic care unit in good condition.  All sponge, needle and instrument counts were correct at the end of the procedure.  Experienced assistance was necessary for this case due to surgical complexity.  Gershon Crane, [place] served as the first Geophysicist/field seismologist providing assistance with port placement, robot docking and undocking, specimen retrieval, suctioning and instrument exchange.   PUS D: 01/24/2023 2:06:03 pm T: 01/24/2023 5:09:00 pm  JOB: [id number]/ [id number]

## 2023-01-24 NOTE — Plan of Care (Signed)
Problem: Education: Goal: Knowledge of disease or condition will improve Outcome: Progressing Goal: Knowledge of the prescribed therapeutic regimen will improve Outcome: Progressing   Problem: Activity: Goal: Risk for activity intolerance will decrease Outcome: Progressing   Problem: Cardiac: Goal: Will achieve and/or maintain hemodynamic stability Outcome: Progressing   Problem: Clinical Measurements: Goal: Postoperative complications will be avoided or minimized Outcome: Progressing   Problem: Respiratory: Goal: Respiratory status will improve Outcome: Progressing   Problem: Pain Management: Goal: Pain level will decrease Outcome: Progressing   Problem: Skin Integrity: Goal: Wound healing without signs and symptoms infection will improve Outcome: Progressing   Problem: Education: Goal: Knowledge of General Education information will improve Description: Including pain rating scale, medication(s)/side effects and non-pharmacologic comfort measures Outcome: Progressing   Problem: Health Behavior/Discharge Planning: Goal: Ability to manage health-related needs will improve Outcome: Progressing   Problem: Clinical Measurements: Goal: Ability to maintain clinical measurements within normal limits will improve Outcome: Progressing Goal: Will remain free from infection Outcome: Progressing Goal: Diagnostic test results will improve Outcome: Progressing Goal: Respiratory complications will improve Outcome: Progressing Goal: Cardiovascular complication will be avoided Outcome: Progressing   Problem: Activity: Goal: Risk for activity intolerance will decrease Outcome: Progressing   Problem: Nutrition: Goal: Adequate nutrition will be maintained Outcome: Progressing   Problem: Coping: Goal: Level of anxiety will decrease Outcome: Progressing   Problem: Elimination: Goal: Will not experience complications related to bowel motility Outcome: Progressing Goal:  Will not experience complications related to urinary retention Outcome: Progressing   Problem: Pain Managment: Goal: General experience of comfort will improve Outcome: Progressing   Problem: Safety: Goal: Ability to remain free from injury will improve Outcome: Progressing   Problem: Skin Integrity: Goal: Risk for impaired skin integrity will decrease Outcome: Progressing   Problem: Education: Goal: Ability to describe self-care measures that may prevent or decrease complications (Diabetes Survival Skills Education) will improve Outcome: Progressing Goal: Individualized Educational Video(s) Outcome: Progressing   Problem: Coping: Goal: Ability to adjust to condition or change in health will improve Outcome: Progressing   Problem: Fluid Volume: Goal: Ability to maintain a balanced intake and output will improve Outcome: Progressing   Problem: Health Behavior/Discharge Planning: Goal: Ability to identify and utilize available resources and services will improve Outcome: Progressing Goal: Ability to manage health-related needs will improve Outcome: Progressing   Problem: Metabolic: Goal: Ability to maintain appropriate glucose levels will improve Outcome: Progressing   Problem: Nutritional: Goal: Maintenance of adequate nutrition will improve Outcome: Progressing Goal: Progress toward achieving an optimal weight will improve Outcome: Progressing   Problem: Skin Integrity: Goal: Risk for impaired skin integrity will decrease Outcome: Progressing   Problem: Tissue Perfusion: Goal: Adequacy of tissue perfusion will improve Outcome: Progressing

## 2023-01-24 NOTE — Progress Notes (Signed)
Mobility Specialist Progress Note:   01/24/23 1210  Mobility  Activity Ambulated with assistance in hallway  Level of Assistance Contact guard assist, steadying assist  Assistive Device Front wheel walker  Activity Response Tolerated well  Mobility Referral Yes  $Mobility charge 1 Mobility  Mobility Specialist Start Time (ACUTE ONLY) 1145  Mobility Specialist Stop Time (ACUTE ONLY) 1200  Mobility Specialist Time Calculation (min) (ACUTE ONLY) 15 min   Pre Mobility: 86 HR During Mobility: 93 HR  Post Mobility: 85 HR  Pt received in room with NT requesting to ambulate. C/o feeling slightly lightheaded and SOB. Pursed lip breathing encouraged. Towards EOS pt stated that she feels better from walking. Pt left in BR asymptomatic with all needs met. Family present. RN notified.   Leory Plowman  Mobility Specialist Please contact via [employer] office at [phone]

## 2023-01-24 NOTE — Progress Notes (Addendum)
1 Day Post-Op Procedure(s) (LRB): XI ROBOTIC ASSISTED THORACOSCOPY-LEFT LOWER LOBECTOMY (Left) Intercostal Nerve Block (Left) REMOVAL PORT-A-CATH (Left) LYMPH NODE BIOPSY (Left) Subjective: Feels generally well, some pain , some abdominal fullness  Objective: Vital signs in last 24 hours: Temp:  [97.6 F (36.4 C)-98.4 F (36.9 C)] 98.3 F (36.8 C) (09/20 0418) Pulse Rate:  [70-95] 79 (09/20 0418) Cardiac Rhythm: Normal sinus rhythm (09/19 1953) Resp:  [14-25] 15 (09/20 0418) BP: (104-151)/(54-84) 104/54 (09/20 0418) SpO2:  [90 %-98 %] 96 % (09/20 0418)  Hemodynamic parameters for last 24 hours:    Intake/Output from previous day: 09/19 0701 - 09/20 0700 In: 3095.4 [P.O.:1200; I.V.:1787.6; IV Piggyback:107.8] Out: 3900 [Urine:3550; Blood:100; Chest Tube:250] Intake/Output this shift: No intake/output data recorded.  General appearance: alert, cooperative, and no distress Heart: regular rate and rhythm Lungs: somewhat coarse throughout Abdomen: soft, non tender, some distension Extremities: no edema Wound: incis healing well  Lab Results: Recent Labs    01/22/23 1530 01/23/23 0923 01/23/23 1026 01/24/23 0252  WBC 7.3  --   --  11.3*  HGB 12.8   < > 10.5* 10.5*  HCT 39.3   < > 31.0* 32.9*  PLT 254  --   --  185   < > = values in this interval not displayed.   BMET:  Recent Labs    01/22/23 1530 01/23/23 0923 01/23/23 1026 01/24/23 0252  NA 136   < > 140 126*  K 3.8   < > 3.5 4.1  CL 100  --   --  98  CO2 23  --   --  18*  GLUCOSE 124*  --   --  780*  BUN 7  --   --  8  CREATININE 0.73  --   --  0.85  CALCIUM 9.0  --   --  7.6*   < > = values in this interval not displayed.    PT/INR:  Recent Labs    01/22/23 1530  LABPROT 12.5  INR 0.9   ABG    Component Value Date/Time   PHART 7.240 (L) 01/23/2023 1026   HCO3 22.2 01/23/2023 1026   TCO2 24 01/23/2023 1026   ACIDBASEDEF 5.0 (H) 01/23/2023 1026   O2SAT 91 01/23/2023 1026   CBG (last 3)   Recent Labs    01/24/23 0414  GLUCAP 315*    Meds Scheduled Meds:  acetaminophen  1,000 mg Oral Q6H   Or   acetaminophen (TYLENOL) oral liquid 160 mg/5 mL  1,000 mg Oral Q6H   albuterol  2.5 mg Nebulization Q4H while awake   allopurinol  100 mg Oral Daily   bisacodyl  10 mg Oral Daily   Chlorhexidine Gluconate Cloth  6 each Topical Daily   enoxaparin (LOVENOX) injection  40 mg Subcutaneous Daily   gabapentin  300 mg Oral QHS   Followed by   Melene Muller ON 01/26/2023] gabapentin  300 mg Oral BID   ketorolac  15 mg Intravenous Q6H   multivitamin with minerals  1 tablet Oral Daily   pantoprazole  40 mg Oral Daily   senna-docusate  1 tablet Oral QHS   Continuous Infusions: PRN Meds:.colchicine, ondansetron (ZOFRAN) IV, oxyCODONE, traMADol  Xrays DG Chest 2 View  Result Date: 01/23/2023 CLINICAL DATA:  Preoperative assessment for left lower lobectomy, history of carcinoid tumor EXAM: CHEST - 2 VIEW COMPARISON:  01/14/2023 FINDINGS: Frontal and lateral views of the chest demonstrate left chest wall port tip in the superior vena cava. Cardiac  silhouette is unremarkable. 1.3 cm left lower lobe nodule corresponds to largest nodule on preceding CT. The smaller nodules within the left lower lobe and right lower lobe seen on prior CT are not well visualized by x-ray. No acute airspace disease, effusion, or pneumothorax. Postsurgical changes from right mastectomy. IMPRESSION: 1. 1.3 cm left lower lobe nodule corresponding to CT findings. The smaller nodule seen in the bilateral lower lobes on prior CT are not apparent by x-ray. Electronically Signed   By: Sharlet Salina M.D.   On: 01/23/2023 17:07   DG Chest Port 1 View  Result Date: 01/23/2023 CLINICAL DATA:  Left lower lobectomy, history of carcinoid tumor EXAM: PORTABLE CHEST 1 VIEW COMPARISON:  01/23/2023 FINDINGS: Single frontal view of the chest demonstrates stable enlargement the cardiac silhouette. Volume loss left hemithorax and elevation  of the left hemidiaphragm consistent with left lower lobectomy. Left chest tube unchanged. Lung volumes are diminished, with no airspace disease, effusion, or pneumothorax. Subcutaneous gas is seen within the left chest wall, tracking into the left supraclavicular region. Interval removal of the left chest wall port. No acute bony abnormalities. IMPRESSION: 1. Postsurgical changes from left lower lobectomy. 2. Stable left chest tube, with continued subcutaneous gas in the left chest wall consistent with recent surgical intervention. No evidence of pneumothorax. Electronically Signed   By: Sharlet Salina M.D.   On: 01/23/2023 17:05   DG Chest 1 View  Result Date: 01/23/2023 CLINICAL DATA:  Rule out foreign body EXAM: CHEST  1 VIEW COMPARISON:  None Available. FINDINGS: ETT is in the left mainstem bronchus. Left-sided chest tube. Cholecystectomy clips. No large pleural effusion or evidence of pneumothorax. Postsurgical changes of left lobectomy. No evidence of metallic foreign body overlying the region of the chest. Small linear metallic staples overlying the upper right hemithorax. A few nonspecific hyperdensities are seen in the left breast soft tissues. IMPRESSION: 1. No evidence of metallic foreign body overlying the region of the chest. 2. A few nonspecific hyperdensities are seen in the left breast soft tissues. 3. ETT is in the left mainstem bronchus. Results were called by telephone at the time of interpretation on 01/23/2023 at 12:26 pm to OR. Electronically Signed   By: Allegra Lai M.D.   On: 01/23/2023 12:29    Assessment/Plan: S/P Procedure(s) (LRB): XI ROBOTIC ASSISTED THORACOSCOPY-LEFT LOWER LOBECTOMY (Left) Intercostal Nerve Block (Left) REMOVAL PORT-A-CATH (Left) LYMPH NODE BIOPSY (Left) POD#1  1 afeb, VSS s BP 104-151 range, sinus rhythm 2 O2 sats good on 2 liters [place] 3 CT 250 ml recorded, no air leak , some tidaling- leave to H2O seal for now 4 excellent UOP 5 BS 315-  DM and not  on any home meds,previously on metformin  will add SSI and check Hg A1c- IVF stopped, likely resume glucophage but will need primary care to follow long term 6 labs pending 7 CXR - small left apical space- large gas bubble in stomach- will add reglan 8 lovenox for dvt ppx 9 routine pulm hygiene, rehab modalities   LOS: 1 day    Rowe Clack PA-C Pager [phone] 01/24/2023

## 2023-01-24 NOTE — Progress Notes (Signed)
Lab called with critical glucose 780.  CBG checked 315.  Lab draw same arm as D5 1/2NS @ 100cc/hr.  Lab called to redraw.  Patient asymptomatic.  States she had been on metformin but oncologist took her off 2 weeks ago and told her to follow up with primary.

## 2023-01-25 ENCOUNTER — Inpatient Hospital Stay (HOSPITAL_COMMUNITY): Payer: 59

## 2023-01-25 LAB — COMPREHENSIVE METABOLIC PANEL
ALT: 20 U/L (ref 0–44)
AST: 29 U/L (ref 15–41)
Albumin: 3.4 g/dL — ABNORMAL LOW (ref 3.5–5.0)
Alkaline Phosphatase: 56 U/L (ref 38–126)
Anion gap: 11 (ref 5–15)
BUN: 16 mg/dL (ref 6–20)
CO2: 21 mmol/L — ABNORMAL LOW (ref 22–32)
Calcium: 8.3 mg/dL — ABNORMAL LOW (ref 8.9–10.3)
Chloride: 98 mmol/L (ref 98–111)
Creatinine, Ser: 0.93 mg/dL (ref 0.44–1.00)
GFR, Estimated: >60 mL/min (ref >60)
Glucose, Bld: 199 mg/dL — ABNORMAL HIGH (ref 70–99)
Potassium: 4.5 mmol/L (ref 3.5–5.1)
Sodium: 130 mmol/L — ABNORMAL LOW (ref 135–145)
Total Bilirubin: 0.9 mg/dL (ref 0.3–1.2)
Total Protein: 6.4 g/dL — ABNORMAL LOW (ref 6.5–8.1)

## 2023-01-25 LAB — CBC
HCT: 34.3 % — ABNORMAL LOW (ref 36.0–46.0)
Hemoglobin: 10.9 g/dL — ABNORMAL LOW (ref 12.0–15.0)
MCH: 27.7 pg (ref 26.0–34.0)
MCHC: 31.8 g/dL (ref 30.0–36.0)
MCV: 87.1 fL (ref 80.0–100.0)
Platelets: 215 10*3/uL (ref 150–400)
RBC: 3.94 MIL/uL (ref 3.87–5.11)
RDW: 13.9 % (ref 11.5–15.5)
WBC: 11.4 10*3/uL — ABNORMAL HIGH (ref 4.0–10.5)
nRBC: 0 % (ref 0.0–0.2)

## 2023-01-25 LAB — HEMOGLOBIN A1C
Hgb A1c MFr Bld: 9.6 % — ABNORMAL HIGH (ref 4.8–5.6)
Mean Plasma Glucose: 228.82 mg/dL

## 2023-01-25 LAB — GLUCOSE, CAPILLARY
Glucose-Capillary: 165 mg/dL — ABNORMAL HIGH (ref 70–99)
Glucose-Capillary: 185 mg/dL — ABNORMAL HIGH (ref 70–99)
Glucose-Capillary: 174 mg/dL — ABNORMAL HIGH (ref 70–99)
Glucose-Capillary: 175 mg/dL — ABNORMAL HIGH (ref 70–99)

## 2023-01-25 NOTE — Discharge Instructions (Signed)
[facility] [id number]   Robot-Assisted Thoracic Surgery, Care After The following information offers guidance on how to care for yourself after your procedure. Your health care provider may also give you more specific instructions. If you have problems or questions, contact your health care provider. What can I expect after the procedure? After the procedure, it is common to have: Some pain and aches in the area of your surgical incisions. Pain when breathing in (inhaling) and coughing. Tiredness (fatigue). Trouble sleeping. Constipation. Follow these instructions at home: Medicines Take over-the-counter and prescription medicines only as told by your health care provider. If you were prescribed an antibiotic medicine, take it as told by your health care provider. Do not stop taking the antibiotic even if you start to feel better. Talk with your health care provider about safe and effective ways to manage pain after your procedure. Pain management should fit your specific health needs. Take pain medicine before pain becomes severe. Relieving and controlling your pain will make breathing easier for you. Ask your health care provider if the medicine prescribed to you requires you to avoid driving or using machinery. Eating and drinking Follow instructions from your health care provider about eating or drinking restrictions. These will vary depending on what procedure you had. Your health care provider may recommend: A liquid diet or soft diet for the first few days. Meals that are smaller and more frequent. A diet of fruits, vegetables, whole grains, and low-fat proteins. Limiting foods that are high in fat and processed sugar, including fried or sweet foods. Incision care Follow instructions from your health care provider about how to take care of your incisions. Make sure you: Wash your hands with soap and water for at least 20 seconds before and after you change  your bandage (dressing). If soap and water are not available, use hand sanitizer. Change your dressing as told by your health care provider. Leave stitches (sutures), skin glue, or adhesive strips in place. These skin closures may need to stay in place for 2 weeks or longer. If adhesive strip edges start to loosen and curl up, you may trim the loose edges. Do not remove adhesive strips completely unless your health care provider tells you to do that. Check your incision area every day for signs of infection. Check for: Redness, swelling, or more pain. Fluid or blood. Warmth. Pus or a bad smell. Activity Return to your normal activities as told by your health care provider. Ask your health care provider what activities are safe for you. Ask your health care provider when it is safe for you to drive. Do not lift anything that is heavier than 10 lb (4.5 kg), or the limit that you are told, until your health care provider says that it is safe. Rest as told by your health care provider. Avoid sitting for a long time without moving. Get up to take short walks every 1-2 hours. This is important to improve blood flow and breathing. Ask for help if you feel weak or unsteady. Do exercises as told by your health care provider. Pneumonia prevention  Do deep breathing exercises and cough regularly as directed. This helps clear mucus and opens your lungs. Doing this helps prevent lung infection (pneumonia). If you were given an incentive spirometer, use it as told. An incentive spirometer is a tool that measures how well you are filling your lungs with each breath. Coughing may hurt less if you try to support your chest. This is  called splinting. Try one of these when you cough: Hold a pillow against your chest. Place the palms of both hands on top of your incision area. Do not use any products that contain nicotine or tobacco. These products include cigarettes, chewing tobacco, and vaping devices, such as  e-cigarettes. If you need help quitting, ask your health care provider. Avoid secondhand smoke. General instructions If you have a drainage tube: Follow instructions from your health care provider about how to take care of it. Do not travel by airplane after your tube is removed until your health care provider tells you it is safe. You may need to take these actions to prevent or treat constipation: Drink enough fluid to keep your urine pale yellow. Take over-the-counter or prescription medicines. Eat foods that are high in fiber, such as beans, whole grains, and fresh fruits and vegetables. Limit foods that are high in fat and processed sugars, such as fried or sweet foods. Keep all follow-up visits. This is important. Contact a health care provider if: You have redness, swelling, or more pain around an incision. You have fluid or blood coming from an incision. An incision feels warm to the touch. You have pus or a bad smell coming from an incision. You have a fever. You cannot eat or drink without vomiting. Your pain medicine is not controlling your pain. Get help right away if: You have chest pain. Your heart is beating quickly. You have trouble breathing. You have trouble speaking. You are confused. You feel weak or dizzy, or you faint. These symptoms may represent a serious problem that is an emergency. Do not wait to see if the symptoms will go away. Get medical help right away. Call your local emergency services (911 in the [place] Do not drive yourself to the hospital. Summary Talk with your health care provider about safe and effective ways to manage pain after your procedure. Pain management should fit your specific health needs. Return to your normal activities as told by your health care provider. Ask your health care provider what activities are safe for you. Do deep breathing exercises and cough regularly as directed. This helps to clear mucus and prevent pneumonia. If it  hurts to cough, ease pain by holding a pillow against your chest or by placing the palms of both hands over your incisions. This information is not intended to replace advice given to you by your health care provider. Make sure you discuss any questions you have with your health care provider. Document Revised: 01/13/2020 Document Reviewed: 01/14/2020 Elsevier Patient Education  2024 [employer].  Information on my medicine - ELIQUIS (apixaban)  Why was Eliquis prescribed for you? Eliquis was prescribed for you to reduce the risk of a blood clot forming that can cause a stroke if you have a medical condition called atrial fibrillation (a type of irregular heartbeat).  What do You need to know about Eliquis ? Take your Eliquis TWICE DAILY - one tablet in the morning and one tablet in the evening with or without food. If you have difficulty swallowing the tablet whole please discuss with your pharmacist how to take the medication safely.  Take Eliquis exactly as prescribed by your doctor and DO NOT stop taking Eliquis without talking to the doctor who prescribed the medication.  Stopping may increase your risk of developing a stroke.  Refill your prescription before you run out.  After discharge, you should have regular check-up appointments with your healthcare provider that is prescribing your

## 2023-01-25 NOTE — Progress Notes (Addendum)
      [address]       [name] [address]             [phone]       2 Days Post-Op Procedure(s) (LRB): XI ROBOTIC ASSISTED THORACOSCOPY-LEFT LOWER LOBECTOMY (Left) Intercostal Nerve Block (Left) REMOVAL PORT-A-CATH (Left) LYMPH NODE BIOPSY (Left)  Subjective: Patient had nausea yesterday but no longer has. She walked two times.  Objective: Vital signs in last 24 hours: Temp:  [97.9 F (36.6 C)-98.3 F (36.8 C)] 98 F (36.7 C) (09/21 0822) Pulse Rate:  [73-80] 80 (09/21 0822) Cardiac Rhythm: Normal sinus rhythm (09/21 0700) Resp:  [18-20] 18 (09/20 2347) BP: (119-139)/(68-86) 125/86 (09/21 0822) SpO2:  [92 %-97 %] 97 % (09/21 0822)    Intake/Output from previous day: 09/20 0701 - 09/21 0700 In: 610.1 [P.O.:240; I.V.:370.1] Out: 200 [Chest Tube:200]   Physical Exam:  Cardiovascular: RRR Pulmonary: Clear to auscultation on right and somewhat coarse/rub on left Abdomen: Soft, non tender, bowel sounds present. Extremities: Trace bilateral lower extremity edema. Wounds: Clean and dry.  No erythema or signs of infection. Chest Tube: to water seal, no air leak  Lab Results: CBC: Recent Labs    01/24/23 0252 01/25/23 0226  WBC 11.3* 11.4*  HGB 10.5* 10.9*  HCT 32.9* 34.3*  PLT 185 215   BMET:  Recent Labs    01/24/23 0646 01/25/23 0226  NA 132* 130*  K 4.6 4.5  CL 98 98  CO2 20* 21*  GLUCOSE 284* 199*  BUN 10 16  CREATININE 0.87 0.93  CALCIUM 8.7* 8.3*    PT/INR:  Recent Labs    01/22/23 1530  LABPROT 12.5  INR 0.9   ABG:  INR: Will add last result for INR, ABG once components are confirmed Will add last 4 CBG results once components are confirmed  Assessment/Plan:  1. CV - SR. 2.  Pulmonary - On room air. Chest tube with 200 cc last 24 hours. CXR this am not ordered so will order. If CXR stable, will likely remove chest tube. Encourage incentive spirometer. Await final pathology result. 3. DM-CBGs 186/165/165.  Previously on Metformin which will start soon. Will check HGA1C .  4. Hyponatremia-sodium this am 130 5. Expected post op blood loss anemia-H and H this am stable at 10.9 and 34.3 6. Likely home in am if chest xray stable  Donielle M ZimmermanPA-C 01/25/2023,9:06 AM   Chart reviewed, patient examined, agree with above. CXR ok and I don't see any air leak even with repeated coughing. Will remove chest tube today and repeat CXR after tube removal and PA/lateral in am. Plan home tomorrow if no changes.

## 2023-01-25 NOTE — Plan of Care (Signed)
Problem: Education: Goal: Knowledge of disease or condition will improve Outcome: Progressing Goal: Knowledge of the prescribed therapeutic regimen will improve Outcome: Progressing   Problem: Activity: Goal: Risk for activity intolerance will decrease Outcome: Progressing   Problem: Cardiac: Goal: Will achieve and/or maintain hemodynamic stability Outcome: Progressing   Problem: Clinical Measurements: Goal: Postoperative complications will be avoided or minimized Outcome: Progressing   Problem: Respiratory: Goal: Respiratory status will improve Outcome: Progressing   Problem: Pain Management: Goal: Pain level will decrease Outcome: Progressing   Problem: Skin Integrity: Goal: Wound healing without signs and symptoms infection will improve Outcome: Progressing   Problem: Education: Goal: Knowledge of General Education information will improve Description: Including pain rating scale, medication(s)/side effects and non-pharmacologic comfort measures Outcome: Progressing   Problem: Health Behavior/Discharge Planning: Goal: Ability to manage health-related needs will improve Outcome: Progressing   Problem: Clinical Measurements: Goal: Ability to maintain clinical measurements within normal limits will improve Outcome: Progressing Goal: Will remain free from infection Outcome: Progressing Goal: Diagnostic test results will improve Outcome: Progressing Goal: Respiratory complications will improve Outcome: Progressing Goal: Cardiovascular complication will be avoided Outcome: Progressing   Problem: Activity: Goal: Risk for activity intolerance will decrease Outcome: Progressing   Problem: Nutrition: Goal: Adequate nutrition will be maintained Outcome: Progressing   Problem: Coping: Goal: Level of anxiety will decrease Outcome: Progressing   Problem: Elimination: Goal: Will not experience complications related to bowel motility Outcome: Progressing Goal:  Will not experience complications related to urinary retention Outcome: Progressing   Problem: Pain Managment: Goal: General experience of comfort will improve Outcome: Progressing   Problem: Safety: Goal: Ability to remain free from injury will improve Outcome: Progressing   Problem: Skin Integrity: Goal: Risk for impaired skin integrity will decrease Outcome: Progressing   Problem: Education: Goal: Ability to describe self-care measures that may prevent or decrease complications (Diabetes Survival Skills Education) will improve Outcome: Progressing Goal: Individualized Educational Video(s) Outcome: Progressing   Problem: Coping: Goal: Ability to adjust to condition or change in health will improve Outcome: Progressing   Problem: Fluid Volume: Goal: Ability to maintain a balanced intake and output will improve Outcome: Progressing   Problem: Health Behavior/Discharge Planning: Goal: Ability to identify and utilize available resources and services will improve Outcome: Progressing Goal: Ability to manage health-related needs will improve Outcome: Progressing   Problem: Metabolic: Goal: Ability to maintain appropriate glucose levels will improve Outcome: Progressing   Problem: Nutritional: Goal: Maintenance of adequate nutrition will improve Outcome: Progressing Goal: Progress toward achieving an optimal weight will improve Outcome: Progressing   Problem: Skin Integrity: Goal: Risk for impaired skin integrity will decrease Outcome: Progressing   Problem: Tissue Perfusion: Goal: Adequacy of tissue perfusion will improve Outcome: Progressing

## 2023-01-25 NOTE — Progress Notes (Signed)
Chest tube removed. DG Chest Port 1 View ordered. Pt tolerated fine, no complains. Vitals wdls.

## 2023-01-26 ENCOUNTER — Inpatient Hospital Stay (HOSPITAL_COMMUNITY): Payer: 59

## 2023-01-26 LAB — GLUCOSE, CAPILLARY
Glucose-Capillary: 189 mg/dL — ABNORMAL HIGH (ref 70–99)
Glucose-Capillary: 168 mg/dL — ABNORMAL HIGH (ref 70–99)

## 2023-01-26 MED ORDER — METFORMIN HCL ER 750 MG PO TB24: 750.0000 mg | ORAL_TABLET | Freq: Every day | ORAL | 1 refills | Status: DC

## 2023-01-26 MED ORDER — OXYCODONE HCL 5 MG PO TABS: 5.0000 mg | ORAL_TABLET | Freq: Four times a day (QID) | ORAL | 0 refills | Status: DC | PRN

## 2023-01-26 MED ORDER — COLCHICINE 0.6 MG PO TABS: 0.6000 mg | ORAL_TABLET | Freq: Every day | ORAL | Status: DC | PRN

## 2023-01-26 MED ORDER — GABAPENTIN 300 MG PO CAPS: 300.0000 mg | ORAL_CAPSULE | Freq: Every day | ORAL | 0 refills | Status: DC

## 2023-01-26 NOTE — Progress Notes (Signed)
Mobility Specialist Progress Note:   01/26/23 0900  Mobility  Activity Ambulated independently in hallway  Level of Assistance Modified independent, requires aide device or extra time  Assistive Device None  Distance Ambulated (ft) 425 ft  Activity Response Tolerated well  Mobility Referral Yes  $Mobility charge 1 Mobility  Mobility Specialist Start Time (ACUTE ONLY) 0910  Mobility Specialist Stop Time (ACUTE ONLY) 0923  Mobility Specialist Time Calculation (min) (ACUTE ONLY) 13 min    Pre Mobility: 96 HR During Mobility: 110 HR Post Mobility:  107 HR  Pt received in chair, agreeable to mobility. C/o some soreness when breathing d/t prior chest tube. Asymptomatic throughout. Pt left in chair with call bell and all needs met.  D'Vante Earlene Plater Mobility Specialist Please contact via Special educational needs teacher or Rehab office at [phone]

## 2023-01-26 NOTE — Progress Notes (Signed)
      [address]       [place] [address]             [phone]       3 Days Post-Op Procedure(s) (LRB): XI ROBOTIC ASSISTED THORACOSCOPY-LEFT LOWER LOBECTOMY (Left) Intercostal Nerve Block (Left) REMOVAL PORT-A-CATH (Left) LYMPH NODE BIOPSY (Left)  Subjective: Patient just finished walking. She has no complaint this am and would like to go home.  Objective: Vital signs in last 24 hours: Temp:  [97.7 F (36.5 C)-99.1 F (37.3 C)] 97.8 F (36.6 C) (09/22 0823) Pulse Rate:  [79-96] 86 (09/22 0427) Cardiac Rhythm: Normal sinus rhythm (09/22 0700) Resp:  [16-20] 19 (09/22 0823) BP: (119-146)/(68-80) 125/68 (09/22 0823) SpO2:  [90 %-98 %] 96 % (09/22 0427)    Intake/Output from previous day: 09/21 0701 - 09/22 0700 In: 120 [P.O.:120] Out: 1 [Stool:1]   Physical Exam:  Cardiovascular: RRR Pulmonary: Clear to auscultation  Abdomen: Soft, non tender, bowel sounds present. Extremities: No lower extremity edema. Wounds: Clean and dry.  No erythema or signs of infection.   Lab Results: CBC: Recent Labs    01/24/23 0252 01/25/23 0226  WBC 11.3* 11.4*  HGB 10.5* 10.9*  HCT 32.9* 34.3*  PLT 185 215   BMET:  Recent Labs    01/24/23 0646 01/25/23 0226  NA 132* 130*  K 4.6 4.5  CL 98 98  CO2 20* 21*  GLUCOSE 284* 199*  BUN 10 16  CREATININE 0.87 0.93  CALCIUM 8.7* 8.3*    PT/INR:  No results for input(s): "LABPROT", "INR" in the last 72 hours.  ABG:  INR: Will add last result for INR, ABG once components are confirmed Will add last 4 CBG results once components are confirmed  Assessment/Plan:  1. CV - SR. 2.  Pulmonary - On room air. Chest tube removed yesterday. CXR this am shows stable, small left apical pneumothorax. Encourage incentive spirometer. Await final pathology result. 3. DM-CBGs 174/175/189. Previously on Metformin which will  restart. HGA1C 9.6. She will need close medical follow up after discharge. 4.  Hyponatremia-sodium this am 130 5. Expected post op blood loss anemia-H and H this am stable at 10.9 and 34.3 6. Discharge  Stepfanie Yott M ZimmermanPA-C 01/26/2023,8:53 AM

## 2023-01-26 NOTE — Anesthesia Postprocedure Evaluation (Signed)
Anesthesia Post Note  Patient: Allison Harper  Procedure(s) Performed: XI ROBOTIC ASSISTED THORACOSCOPY-LEFT LOWER LOBECTOMY (Left: Chest) Intercostal Nerve Block (Left: Chest) REMOVAL PORT-A-CATH (Left: Chest) LYMPH NODE BIOPSY (Left: Chest)     Patient location during evaluation: PACU Anesthesia Type: General Level of consciousness: awake and alert Pain management: pain level controlled Vital Signs Assessment: post-procedure vital signs reviewed and stable Respiratory status: spontaneous breathing, nonlabored ventilation, respiratory function stable and patient connected to nasal cannula oxygen Cardiovascular status: blood pressure returned to baseline and stable Postop Assessment: no apparent nausea or vomiting Anesthetic complications: no   No notable events documented.  Last Vitals:  Vitals:   01/26/23 0427 01/26/23 0823  BP: 137/74 125/68  Pulse: 86   Resp: 20 19  Temp: 36.5 C 36.6 C  SpO2: 96%     Last Pain:  Vitals:   01/26/23 0918  TempSrc:   PainSc: 3                  Denita Lun S

## 2023-01-27 ENCOUNTER — Telehealth: Payer: Self-pay

## 2023-01-27 LAB — SURGICAL PATHOLOGY

## 2023-01-27 NOTE — Telephone Encounter (Signed)
DATE CHANGE ON LOA: Beginning LOA is 01/22/23 through 03/25/23/ DOS 01/23/23/ 8 week post-op recovery./form faxed to [email or web address]@ [phone].

## 2023-01-31 ENCOUNTER — Other Ambulatory Visit: Payer: Self-pay | Admitting: Thoracic Surgery (Cardiothoracic Vascular Surgery)

## 2023-01-31 DIAGNOSIS — D3A090 Benign carcinoid tumor of the bronchus and lung: Secondary | ICD-10-CM

## 2023-02-03 ENCOUNTER — Ambulatory Visit: Payer: Self-pay | Admitting: Thoracic Surgery (Cardiothoracic Vascular Surgery)

## 2023-02-04 ENCOUNTER — Ambulatory Visit
Admission: RE | Admit: 2023-02-04 | Discharge: 2023-02-04 | Disposition: A | Discharge status: Home / Self Care | Payer: 59 | Source: Ambulatory Visit | Attending: Thoracic Surgery (Cardiothoracic Vascular Surgery) | Admitting: Thoracic Surgery (Cardiothoracic Vascular Surgery)

## 2023-02-04 ENCOUNTER — Encounter: Payer: Self-pay | Admitting: Thoracic Surgery (Cardiothoracic Vascular Surgery)

## 2023-02-04 ENCOUNTER — Ambulatory Visit (INDEPENDENT_AMBULATORY_CARE_PROVIDER_SITE_OTHER): Payer: Self-pay | Admitting: Thoracic Surgery (Cardiothoracic Vascular Surgery)

## 2023-02-04 VITALS — BP 137/77 | HR 76 | Resp 20 | Ht 69.0 in | Wt 254.4 lb

## 2023-02-04 DIAGNOSIS — D3A090 Benign carcinoid tumor of the bronchus and lung: Secondary | ICD-10-CM

## 2023-02-04 DIAGNOSIS — Z902 Acquired absence of lung [part of]: Secondary | ICD-10-CM

## 2023-02-04 NOTE — Progress Notes (Signed)
[address]       [name] [address]             [phone]     HPI: [name] returns for follow-up after recent left lower lobectomy.  Allison Harper is a 52 year old woman with a history of breast cancer, peripheral neuropathy, and carcinoid tumors of the lung.  She was found to have a left lower lobe lung nodules back in 2023.  No significant activity on FDG PET.  Bronchoscopy showed low-grade carcinoid tumors.  Dotatate PET showed low-grade activity.  We discussed observation versus resection and she very strongly wanted to proceed with resection.  I did a robotic assisted left lower lobectomy on 01/23/2023.  Unfortunately the more central nodule is not amenable to a wedge resection.  She tolerated the procedure well and went home on day 3.  She has some pain.  She really has not been taking oxycodone much at all.  She sometimes takes it at night before she goes to bed.  She is very concerned about running out and not being able to access it if needed.  Also has occasionally been using Naprosyn.  Past Medical History:  Diagnosis Date   Anemia 2023   Breast cancer (HCC)    Breast screening, unspecified 05/06/2010   Obesity 05/06/2010   Other benign neoplasm of connective and other soft tissue of trunk, unspecified 05/06/2010     Current Outpatient Medications  Medication Sig Dispense Refill   allopurinol (ZYLOPRIM) 100 MG tablet Take 100 mg by mouth daily.     colchicine 0.6 MG tablet Take 1 tablet (0.6 mg total) by mouth daily as needed (gout flares).     gabapentin (NEURONTIN) 300 MG capsule Take 1 capsule (300 mg total) by mouth at bedtime. 30 capsule 0   Magnesium Cl-Calcium Carbonate (SLOW MAGNESIUM/CALCIUM) 70-117 MG TBEC Take 1 tablet by mouth 2 (two) times daily. 60 tablet 6   metFORMIN (GLUCOPHAGE-XR) 750 MG 24 hr tablet Take 1 tablet (750 mg total) by mouth daily with breakfast. 30 tablet 1   Multiple Vitamins-Minerals (MULTIVITAMIN WITH  MINERALS) tablet Take 1 tablet by mouth daily.     OVER THE COUNTER MEDICATION Take 2 capsules by mouth daily. [place] Tail Mushrooms     oxyCODONE (OXY IR/ROXICODONE) 5 MG immediate release tablet Take 1 tablet (5 mg total) by mouth every 6 (six) hours as needed for severe pain. 30 tablet 0   No current facility-administered medications for this visit.   Facility-Administered Medications Ordered in Other Visits  Medication Dose Route Frequency Provider Last Rate Last Admin   heparin lock flush 100 UNIT/ML injection             Physical Exam BP 137/77 (BP Location: Left Arm, Patient Position: Sitting, Cuff Size: Normal)   Pulse 76   Resp 20   Ht 5\' 9"  (1.753 m)   Wt 254 lb 6.4 oz (115.4 kg)   SpO2 97% Comment: RA  BMI 37.41 kg/m  52 year old woman with no acute distress Alert and oriented x 3 with no focal deficits Incisions clean dry and intact with small seroma Lungs absent breath sounds left base, otherwise clear Cardiac regular rate and rhythm  Diagnostic Tests: I personally reviewed the chest x-ray which shows postoperative changes from left lower lobectomy.  No effusions or infiltrates.  Impression: Allison Harper is a 52 year old woman with a history of breast cancer, peripheral neuropathy, and carcinoid tumors of the lung.  Status  post left lower lobectomy for carcinoid tumors.  Overall doing well.  Postoperative pain-taking gabapentin 300 mg nightly and then only using oxycodone about once a day at the most.  I recommended that she use Tylenol on a scheduled basis.  She can supplement that with her nonsteroidal anti-inflammatory choice, which is naproxen.  Continue gabapentin at the current dose.  I encouraged her to then use the oxycodone when she needs it for pain.  If she runs out and needs additional medication we are happy to prescribe that for her.  She just needs to contact us to let us know.  I advised her not to drive until she is having less pain.  When her pain is  under control and she is not having to take narcotics during the day she can start driving locally and then gradually increase time and distance.  No restrictions on her activities otherwise.  However, I cautioned her to build back into her activities gradually over time.  Her follow-up for the carcinoids can be done in conjunction with her breast cancer follow-up with Dr. Donneta Romberg.  Plan: Follow-up with Dr. Donneta Romberg as scheduled. Return in 6 weeks with PA and lateral chest x-ray  Loreli Slot, MD [facility] [phone]

## 2023-02-11 ENCOUNTER — Telehealth: Payer: Self-pay | Admitting: *Deleted

## 2023-02-11 ENCOUNTER — Other Ambulatory Visit: Payer: Self-pay | Admitting: Physician Assistant

## 2023-02-11 MED ORDER — OXYCODONE HCL 5 MG PO TABS: 5.0000 mg | ORAL_TABLET | Freq: Four times a day (QID) | ORAL | 0 refills | Status: DC | PRN

## 2023-02-11 NOTE — Progress Notes (Signed)
Pt is s/p robotic assisted left lower lobectomy on 09/9. She has called stating she has run out of Oxycodone and is requesting more. She saw Dr. Dorris Fetch last week and his note states we may refill her Oxycodone when she runs out. I will send Oxycodone 5mg  Q6H to the [facility] on [address]   Allison Harper, [place]

## 2023-02-11 NOTE — Telephone Encounter (Signed)
Patient contacted the office requesting refill of oxycodone. Refill sent per B. Stehler, PA to patient's preferred pharmacy. VM left making patient aware of refill.

## 2023-02-15 ENCOUNTER — Other Ambulatory Visit: Payer: Self-pay

## 2023-03-12 ENCOUNTER — Ambulatory Visit (INDEPENDENT_AMBULATORY_CARE_PROVIDER_SITE_OTHER): Payer: 59 | Admitting: Physician Assistant

## 2023-03-12 ENCOUNTER — Ambulatory Visit (HOSPITAL_COMMUNITY): Payer: 59

## 2023-03-12 ENCOUNTER — Other Ambulatory Visit (HOSPITAL_COMMUNITY)
Admission: RE | Admit: 2023-03-12 | Discharge: 2023-03-12 | Disposition: A | Discharge status: Home / Self Care | Payer: 59 | Source: Ambulatory Visit | Attending: Physician Assistant | Admitting: Physician Assistant

## 2023-03-12 ENCOUNTER — Encounter: Payer: Self-pay | Admitting: Physician Assistant

## 2023-03-12 VITALS — BP 122/83 | HR 62 | Temp 97.1°F | Ht 69.0 in | Wt 256.6 lb

## 2023-03-12 DIAGNOSIS — N950 Postmenopausal bleeding: Secondary | ICD-10-CM | POA: Diagnosis present

## 2023-03-12 DIAGNOSIS — Z124 Encounter for screening for malignant neoplasm of cervix: Secondary | ICD-10-CM | POA: Insufficient documentation

## 2023-03-12 DIAGNOSIS — C50911 Malignant neoplasm of unspecified site of right female breast: Secondary | ICD-10-CM | POA: Diagnosis not present

## 2023-03-12 DIAGNOSIS — M1000 Idiopathic gout, unspecified site: Secondary | ICD-10-CM | POA: Diagnosis not present

## 2023-03-12 DIAGNOSIS — Z7984 Long term (current) use of oral hypoglycemic drugs: Secondary | ICD-10-CM

## 2023-03-12 DIAGNOSIS — E1165 Type 2 diabetes mellitus with hyperglycemia: Secondary | ICD-10-CM

## 2023-03-12 DIAGNOSIS — Z1329 Encounter for screening for other suspected endocrine disorder: Secondary | ICD-10-CM

## 2023-03-12 DIAGNOSIS — E782 Mixed hyperlipidemia: Secondary | ICD-10-CM

## 2023-03-12 DIAGNOSIS — Z1731 Human epidermal growth factor receptor 2 positive status: Secondary | ICD-10-CM

## 2023-03-12 LAB — MICROALBUMIN / CREATININE URINE RATIO
Creatinine,U: 16.4 mg/dL
Microalb Creat Ratio: 4.3 mg/g (ref 0.0–30.0)
Microalb, Ur: <0.7 mg/dL (ref 0.0–1.9)

## 2023-03-12 LAB — POCT URINALYSIS DIPSTICK
Bilirubin, UA: NEGATIVE
Blood, UA: NEGATIVE
Glucose, UA: NEGATIVE
Ketones, UA: NEGATIVE
Leukocytes, UA: NEGATIVE
Nitrite, UA: NEGATIVE
Protein, UA: NEGATIVE
Spec Grav, UA: <=1.005 — AB (ref 1.010–1.025)
Urobilinogen, UA: 0.2 U/dL — AB
pH, UA: 6 (ref 5.0–8.0)

## 2023-03-12 MED ORDER — METFORMIN HCL ER 750 MG PO TB24
750.0000 mg | ORAL_TABLET | Freq: Every day | ORAL | 3 refills | Status: DC
Start: 2023-03-12 — End: 2023-04-15

## 2023-03-12 MED ORDER — COLCHICINE 0.6 MG PO TABS: 0.6000 mg | ORAL_TABLET | Freq: Every day | ORAL | 2 refills | Status: AC | PRN

## 2023-03-12 MED ORDER — ALLOPURINOL 100 MG PO TABS
100.0000 mg | ORAL_TABLET | Freq: Every day | ORAL | 3 refills | Status: DC
Start: 2023-03-12 — End: 2023-04-15

## 2023-03-12 NOTE — Patient Instructions (Signed)
Welcome to [facility]! It was a pleasure meeting you today.  Please schedule fasting labs with [facility] office, then schedule a follow-up with me a few weeks after to go over results (this can also be a VIRTUAL appointment with me if more convenient).  You will be contacted to schedule a PELVIC ULTRASOUND.  Your medications have been refilled as requested.   PLEASE NOTE:  If you had any LAB tests please let us know if you have not heard back within a few days. You may see your results on MyChart before we have a chance to review them but we will give you a call once they are reviewed by [place]. If we ordered any REFERRALS today, please let us know if you have not heard from their office within the next two weeks. Let us know through MyChart if you are needing REFILLS, or have your pharmacy send [place] the request. You can also use MyChart to communicate with me or any office staff.  Please try these tips to maintain a healthy lifestyle:  Eat most of your calories during the day when you are active. Eliminate processed foods including packaged sweets (pies, cakes, cookies), reduce intake of potatoes, white bread, white pasta, and white rice. Look for whole grain options, oat flour or almond flour.  Each meal should contain half fruits/vegetables, one quarter protein, and one quarter carbs (no bigger than a computer mouse).  Cut down on sweet beverages. This includes juice, soda, and sweet tea. Also watch fruit intake, though this is a healthier sweet option, it still contains natural sugar! Limit to 3 servings daily.  Drink at least 1 glass of water with each meal and aim for at least 8 glasses (64 ounces) per day.  Exercise at least 150 minutes every week to the best of your ability.    Take Care,  Nancye Grumbine, PA-C

## 2023-03-12 NOTE — Progress Notes (Signed)
Subjective:    Patient ID: Allison Harper, female    DOB: [birth year], 52 y.o.   MRN: [id number]  Chief Complaint  Patient presents with   Anemia   Breast Cancer    Pt would like breast exam   Diabetes   Establish Care    Pt would like pap smear - pt had breakthrough bleeding on 02/22/23 for 9 days and has not had a cycle in 2 years, pt aware due for colonoscopy, has never had one.    HPI Discussed the use of AI scribe software for clinical note transcription with the patient, who gave verbal consent to proceed.  History of Present Illness   Allison Harper, a patient with a history of breast and lung cancer, PCOS, insulin resistance, and gout, presents with abnormal vaginal bleeding after a two-year period of amenorrhea. She reports the bleeding lasted for about nine days, starting in mid-October. The bleeding was initially strong, then light, and then ceased, only to start again. She is concerned about the abnormal bleeding and is anxious to understand if it was a period or indicative of another issue.  In addition to the abnormal bleeding, she reports a history of gout, which is managed with allopurinol and colchicine as needed. She also has insulin resistance, managed with metformin. She has recently undergone a mastectomy and lobectomy for breast and lung cancer, respectively. She reports experiencing lymphedema post-mastectomy and is considering lymphatic massages as a potential treatment.  She also mentions a history of PCOS, diagnosed in her twenties, but reports that no treatment was initiated at the time. She was later put on metformin due to insulin resistance. She stopped taking metformin for a period due to personal beliefs about medication use but has since resumed taking it, particularly after a spike in blood sugar levels post-lobectomy.       Past Medical History:  Diagnosis Date   Anemia 2023   Breast cancer (HCC)    Breast screening, unspecified 05/06/2010   Diabetes mellitus  without complication (HCC)    Obesity 05/06/2010   Other benign neoplasm of connective and other soft tissue of trunk, unspecified 05/06/2010    Past Surgical History:  Procedure Laterality Date   CHOLECYSTECTOMY     CYST EXCISION  05/06/2006   from back   LIPOMA EXCISION Left 09/04/2010   flank   LYMPH NODE BIOPSY Left 01/23/2023   Procedure: LYMPH NODE BIOPSY;  Surgeon: Loreli Slot, MD;  Location: [facility] OR;  Service: Thoracic;  Laterality: Left;   MASTECTOMY Right 2022   PORT-A-CATH REMOVAL Left 01/23/2023   Procedure: REMOVAL PORT-A-CATH;  Surgeon: Loreli Slot, MD;  Location: [facility] OR;  Service: Thoracic;  Laterality: Left;   TONSILLECTOMY      Family History  Problem Relation Age of Onset   Breast cancer Neg Hx     Social History   Tobacco Use   Smoking status: Never  Vaping Use   Vaping status: Never Used  Substance Use Topics   Alcohol use: Yes    Comment: ocassionally, 1-2 per month   Drug use: No     Allergies  Allergen Reactions   Copper Cu 64 Dotatate Diarrhea, Nausea Only, Rash and Other (See Comments)    Chest pain   Hydrocodone-Acetaminophen Itching and Rash    Review of Systems NEGATIVE UNLESS OTHERWISE INDICATED IN HPI      Objective:     BP 122/83   Pulse 62   Temp (!) 97.1 F (36.2 C)  Ht 5\' 9"  (1.753 m)   Wt 256 lb 9.6 oz (116.4 kg)   SpO2 98%   BMI 37.89 kg/m   Wt Readings from Last 3 Encounters:  03/12/23 256 lb 9.6 oz (116.4 kg)  02/04/23 254 lb 6.4 oz (115.4 kg)  01/23/23 171 lb (77.6 kg)    BP Readings from Last 3 Encounters:  03/12/23 122/83  02/04/23 137/77  01/26/23 (!) 141/74     Physical Exam Vitals and nursing note reviewed. Exam conducted with a chaperone present.  Constitutional:      Appearance: Normal appearance. She is obese. She is not toxic-appearing.  HENT:     Head: Normocephalic and atraumatic.  Eyes:     Extraocular Movements: Extraocular movements intact.     Conjunctiva/sclera:  Conjunctivae normal.     Pupils: Pupils are equal, round, and reactive to light.  Neck:     Thyroid: No thyroid mass, thyromegaly or thyroid tenderness.  Cardiovascular:     Rate and Rhythm: Normal rate and regular rhythm.     Pulses: Normal pulses.     Heart sounds: Normal heart sounds.  Pulmonary:     Effort: Pulmonary effort is normal.     Breath sounds: Normal breath sounds.  Chest:     Chest wall: No mass.  Breasts:    Right: Absent. No mass or tenderness.     Left: Normal. No swelling, bleeding, inverted nipple, mass, nipple discharge, skin change or tenderness.     Comments: Surgical scarring noted left lateral chest  Abdominal:     General: Abdomen is flat. Bowel sounds are normal.     Palpations: Abdomen is soft.  Genitourinary:    General: Normal vulva.     Labia:        Right: No rash, tenderness or lesion.        Left: No rash, tenderness or lesion.      Vagina: Normal.     Cervix: Normal.     Uterus: Normal.      Adnexa: Right adnexa normal and left adnexa normal.  Musculoskeletal:     Cervical back: Normal range of motion and neck supple.  Lymphadenopathy:     Cervical: No cervical adenopathy.     Upper Body:     Right upper body: No supraclavicular, axillary or pectoral adenopathy.     Left upper body: No supraclavicular, axillary or pectoral adenopathy.     Lower Body: No right inguinal adenopathy. No left inguinal adenopathy.  Skin:    General: Skin is warm and dry.     Findings: No lesion.  Neurological:     General: No focal deficit present.     Mental Status: She is alert and oriented to person, place, and time.  Psychiatric:        Mood and Affect: Mood normal.        Behavior: Behavior normal.        Thought Content: Thought content normal.        Judgment: Judgment normal.        Assessment & Plan:  HER2-positive carcinoma of right breast (HCC) -     FSH/LH; Future -     Estradiol; Future -     VITAMIN D 25 Hydroxy (Vit-D Deficiency,  Fractures); Future -     US PELVIC COMPLETE WITH TRANSVAGINAL; Future  Type 2 diabetes mellitus with hyperglycemia, without long-term current use of insulin (HCC) -     metFORMIN HCl ER; Take 1 tablet (750 mg  total) by mouth daily with breakfast.  Dispense: 90 tablet; Refill: 3 -     CBC with Differential/Platelet; Future -     Comprehensive metabolic panel; Future -     Hemoglobin A1c; Future -     POCT urinalysis dipstick -     Microalbumin / creatinine urine ratio  Idiopathic gout, unspecified chronicity, unspecified site -     Colchicine; Take 1 tablet (0.6 mg total) by mouth daily as needed (gout flares).  Dispense: 30 tablet; Refill: 2 -     Allopurinol; Take 1 tablet (100 mg total) by mouth daily. Take for gout prevention.  Dispense: 90 tablet; Refill: 3 -     CBC with Differential/Platelet; Future -     Uric acid; Future  Thyroid disorder screen -     TSH; Future  Mixed hyperlipidemia -     Lipid panel; Future  Postmenopausal bleeding -     FSH/LH; Future -     Estradiol; Future -     POCT urinalysis dipstick -     US PELVIC COMPLETE WITH TRANSVAGINAL; Future -     Cytology - PAP  Encounter for Papanicolaou smear of cervix -     Cytology - PAP    Assessment and Plan    Post-Menopausal Bleeding Patient with a history of breast and lung cancer, now presenting with vaginal bleeding after two years of amenorrhea. No pain or bowel changes reported. -Perform pelvic exam and Pap smear today. -Order pelvic ultrasound to further evaluate the cause of bleeding.  Breast Cancer Patient with a history of right mastectomy for breast cancer, now with lymphedema and scar tissue. No new lumps or skin changes reported. -Continue follow-up with oncologist in November. -Consider lymphatic massages for lymphedema management.  Gout Patient with a history of gout, currently managed with allopurinol 100mg  daily and colchicine 0.6mg  as needed. No recent flares reported. -Continue  current medication regimen. Refilled today.  -Check uric acid level to ensure effective dosing.  Diabetes Patient with a history of insulin resistance, currently managed with metformin. Recent spike in glucose levels reported post-lobectomy. -Order fasting labs to establish a baseline and monitor glucose levels. -Continue metformin XR 750 mg daily as prescribed. Lab Results  Component Value Date   HGBA1C 9.6 (H) 01/25/2023    General Health Maintenance -Consider colonoscopy for cancer screening. -Consider mammogram or ultrasound screening for breast cancer surveillance, in discussion with oncologist. -Schedule follow-up appointment in 3-4 weeks to discuss diabetes management and other health concerns once fasting labs are completed.        Time Spent: 55 minutes of total time was spent on the date of the encounter performing the following actions: chart review prior to seeing the patient, obtaining history, performing a medically necessary exam, counseling on the treatment plan, placing orders, and documenting in our EHR.       Yarethzy Croak M Javar Eshbach, PA-C

## 2023-03-13 ENCOUNTER — Other Ambulatory Visit: Payer: Self-pay

## 2023-03-17 ENCOUNTER — Other Ambulatory Visit: Payer: Self-pay | Admitting: Thoracic Surgery (Cardiothoracic Vascular Surgery)

## 2023-03-17 DIAGNOSIS — D3A090 Benign carcinoid tumor of the bronchus and lung: Secondary | ICD-10-CM

## 2023-03-17 LAB — CYTOLOGY - PAP
Chlamydia: NEGATIVE
Comment: NEGATIVE
Comment: NEGATIVE
Comment: NEGATIVE
Comment: NORMAL
Diagnosis: NEGATIVE
Diagnosis: REACTIVE
High risk HPV: NEGATIVE
Neisseria Gonorrhea: NEGATIVE
Trichomonas: NEGATIVE

## 2023-03-18 ENCOUNTER — Encounter: Payer: Self-pay | Admitting: Physician Assistant

## 2023-03-18 ENCOUNTER — Ambulatory Visit
Admission: RE | Admit: 2023-03-18 | Discharge: 2023-03-18 | Disposition: A | Discharge status: Home / Self Care | Payer: 59 | Source: Ambulatory Visit | Attending: Thoracic Surgery (Cardiothoracic Vascular Surgery) | Admitting: Thoracic Surgery (Cardiothoracic Vascular Surgery)

## 2023-03-18 ENCOUNTER — Encounter: Payer: Self-pay | Admitting: Thoracic Surgery (Cardiothoracic Vascular Surgery)

## 2023-03-18 ENCOUNTER — Ambulatory Visit (INDEPENDENT_AMBULATORY_CARE_PROVIDER_SITE_OTHER): Payer: Self-pay | Admitting: Thoracic Surgery (Cardiothoracic Vascular Surgery)

## 2023-03-18 VITALS — BP 137/84 | HR 71 | Resp 20 | Ht 69.0 in | Wt 256.0 lb

## 2023-03-18 DIAGNOSIS — D3A090 Benign carcinoid tumor of the bronchus and lung: Secondary | ICD-10-CM

## 2023-03-18 MED ORDER — GABAPENTIN 300 MG PO CAPS: 300.0000 mg | ORAL_CAPSULE | Freq: Two times a day (BID) | ORAL | 3 refills | Status: DC

## 2023-03-18 NOTE — Progress Notes (Signed)
[address]       [name] [address]             [phone]     HPI: [name] returns for a scheduled follow-up visit after recent left lower lobectomy.  Allison Harper is a 52 year old woman with a past medical history significant for obesity, type 2 diabetes, breast cancer, gout, peripheral neuropathy, and carcinoid tumors of the lung status post left lower lobectomy.  She underwent robotic assisted left lower lobectomy on 01/23/2023.  She had 2 carcinoid tumors.  The more central and was not amenable to a wedge.  She did well initially and went home on day 3.  I saw her back on 02/04/2023.  She was doing pretty well at that time but was having some incisional pain.  Her pain has persisted and she is also concerned because of some paresthesias numbness and swelling at the left costal margin.  No longer using oxycodone.  She ran out of gabapentin.  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, unspecified 05/06/2010     Current Outpatient Medications  Medication Sig Dispense Refill   allopurinol (ZYLOPRIM) 100 MG tablet Take 1 tablet (100 mg total) by mouth daily. Take for gout prevention. 90 tablet 3   colchicine 0.6 MG tablet Take 1 tablet (0.6 mg total) by mouth daily as needed (gout flares). 30 tablet 2   metFORMIN (GLUCOPHAGE-XR) 750 MG 24 hr tablet Take 1 tablet (750 mg total) by mouth daily with breakfast. 90 tablet 3   Multiple Vitamins-Minerals (MULTIVITAMIN WITH MINERALS) tablet Take 1 tablet by mouth daily.     OVER THE COUNTER MEDICATION Take 2 capsules by mouth daily. [place] Tail Mushrooms     gabapentin (NEURONTIN) 300 MG capsule Take 1 capsule (300 mg total) by mouth 2 (two) times daily. 60 capsule 3   Magnesium Cl-Calcium Carbonate  (SLOW MAGNESIUM/CALCIUM) 70-117 MG TBEC Take 1 tablet by mouth 2 (two) times daily. (Patient not taking: Reported on 03/18/2023) 60 tablet 6   No current facility-administered medications for this visit.   Facility-Administered Medications Ordered in Other Visits  Medication Dose Route Frequency Provider Last Rate Last Admin   heparin lock flush 100 UNIT/ML injection             Physical Exam BP 137/84 (BP Location: Left Arm, Patient Position: Sitting, Cuff Size: Large)   Pulse 71   Resp 20   Ht 5\' 9"  (1.753 m)   Wt 256 lb (116.1 kg)   SpO2 96% Comment: RA  BMI 37.48 kg/m  52 year old woman in no acute distress Alert and oriented x 3 with no focal deficits Lungs diminished breath sounds at left base otherwise clear Incisions well-healed Laxity of the abdominal musculature left upper quadrant  Diagnostic Tests: I personally reviewed her chest x-ray images.  Status post left lower lobectomy.  Slightly improved expansion of the left upper lobe.  Impression: Allison Harper is a 52 year old woman with a past medical history significant for obesity, type 2 diabetes, breast cancer, gout, peripheral neuropathy, and carcinoid tumors of the lung status post left lower lobectomy.  Status post left lower lobectomy-doing well from a respiratory perspective.  She does have some intercostal neuralgia pain and also has abdominal  bulging that frequently occurs with this type of operation.  I tried to reassure her that this is very common and not a sign of something going wrong.  No longer taking narcotics but does use Tylenol and naproxen.  I do think she would probably benefit from being on gabapentin a little longer and probably at a higher dose somata start her on 300 mg p.o. twice daily, 60 tablets, 3 refills.  There are no restrictions on her activities.  She will not injure herself by engaging in physical activities but it could cause her pain to increase.  She may return to work full-time as  scheduled  Plan: Gabapentin 300 mg twice daily Follow-up with Dr. Donneta Romberg as scheduled Return in 2 months with PA lateral chest x-ray  Loreli Slot, MD [facility] [phone]

## 2023-03-19 ENCOUNTER — Other Ambulatory Visit (INDEPENDENT_AMBULATORY_CARE_PROVIDER_SITE_OTHER): Payer: 59

## 2023-03-19 DIAGNOSIS — Z1731 Human epidermal growth factor receptor 2 positive status: Secondary | ICD-10-CM

## 2023-03-19 DIAGNOSIS — E1165 Type 2 diabetes mellitus with hyperglycemia: Secondary | ICD-10-CM | POA: Diagnosis not present

## 2023-03-19 DIAGNOSIS — E782 Mixed hyperlipidemia: Secondary | ICD-10-CM

## 2023-03-19 DIAGNOSIS — Z7984 Long term (current) use of oral hypoglycemic drugs: Secondary | ICD-10-CM | POA: Diagnosis not present

## 2023-03-19 DIAGNOSIS — Z1329 Encounter for screening for other suspected endocrine disorder: Secondary | ICD-10-CM | POA: Diagnosis not present

## 2023-03-19 DIAGNOSIS — M1000 Idiopathic gout, unspecified site: Secondary | ICD-10-CM | POA: Diagnosis not present

## 2023-03-19 DIAGNOSIS — N950 Postmenopausal bleeding: Secondary | ICD-10-CM

## 2023-03-19 DIAGNOSIS — C50911 Malignant neoplasm of unspecified site of right female breast: Secondary | ICD-10-CM

## 2023-03-19 LAB — COMPREHENSIVE METABOLIC PANEL
ALT: 15 U/L (ref 0–35)
AST: 22 U/L (ref 0–37)
Albumin: 4.2 g/dL (ref 3.5–5.2)
Alkaline Phosphatase: 71 U/L (ref 39–117)
BUN: 10 mg/dL (ref 6–23)
CO2: 26 meq/L (ref 19–32)
Calcium: 9.5 mg/dL (ref 8.4–10.5)
Chloride: 102 meq/L (ref 96–112)
Creatinine, Ser: 0.69 mg/dL (ref 0.40–1.20)
GFR: 99.01 mL/min (ref >60.00)
Glucose, Bld: 161 mg/dL — ABNORMAL HIGH (ref 70–99)
Potassium: 4.3 meq/L (ref 3.5–5.1)
Sodium: 138 meq/L (ref 135–145)
Total Bilirubin: 0.5 mg/dL (ref 0.2–1.2)
Total Protein: 7.1 g/dL (ref 6.0–8.3)

## 2023-03-19 LAB — CBC WITH DIFFERENTIAL/PLATELET
Basophils Absolute: 0.1 10*3/uL (ref 0.0–0.1)
Basophils Relative: 0.9 % (ref 0.0–3.0)
Eosinophils Absolute: 0.3 10*3/uL (ref 0.0–0.7)
Eosinophils Relative: 4.6 % (ref 0.0–5.0)
HCT: 39.2 % (ref 36.0–46.0)
Hemoglobin: 12.6 g/dL (ref 12.0–15.0)
Lymphocytes Relative: 31.5 % (ref 12.0–46.0)
Lymphs Abs: 2.1 10*3/uL (ref 0.7–4.0)
MCHC: 32.2 g/dL (ref 30.0–36.0)
MCV: 87.3 fL (ref 78.0–100.0)
Monocytes Absolute: 0.5 10*3/uL (ref 0.1–1.0)
Monocytes Relative: 6.9 % (ref 3.0–12.0)
Neutro Abs: 3.8 10*3/uL (ref 1.4–7.7)
Neutrophils Relative %: 56.1 % (ref 43.0–77.0)
Platelets: 245 10*3/uL (ref 150.0–400.0)
RBC: 4.49 Mil/uL (ref 3.87–5.11)
RDW: 14.1 % (ref 11.5–15.5)
WBC: 6.8 10*3/uL (ref 4.0–10.5)

## 2023-03-19 LAB — LIPID PANEL
Cholesterol: 227 mg/dL — ABNORMAL HIGH (ref 0–200)
HDL: 46.4 mg/dL (ref >39.00)
LDL Cholesterol: 149 mg/dL — ABNORMAL HIGH (ref 0–99)
NonHDL: 180.63
Total CHOL/HDL Ratio: 5
Triglycerides: 159 mg/dL — ABNORMAL HIGH (ref 0.0–149.0)
VLDL: 31.8 mg/dL (ref 0.0–40.0)

## 2023-03-19 LAB — HEMOGLOBIN A1C: Hgb A1c MFr Bld: 8.1 % — ABNORMAL HIGH (ref 4.6–6.5)

## 2023-03-19 LAB — VITAMIN D 25 HYDROXY (VIT D DEFICIENCY, FRACTURES): VITD: 9.22 ng/mL — ABNORMAL LOW (ref 30.00–100.00)

## 2023-03-19 LAB — TSH: TSH: 1.94 u[IU]/mL (ref 0.35–5.50)

## 2023-03-19 LAB — URIC ACID: Uric Acid, Serum: 6.7 mg/dL (ref 2.4–7.0)

## 2023-03-20 ENCOUNTER — Other Ambulatory Visit: Payer: Self-pay

## 2023-03-20 ENCOUNTER — Ambulatory Visit
Admission: RE | Admit: 2023-03-20 | Discharge: 2023-03-20 | Disposition: A | Discharge status: Home / Self Care | Payer: 59 | Source: Ambulatory Visit | Attending: Physician Assistant | Admitting: Physician Assistant

## 2023-03-20 DIAGNOSIS — C50911 Malignant neoplasm of unspecified site of right female breast: Secondary | ICD-10-CM | POA: Diagnosis present

## 2023-03-20 DIAGNOSIS — Z1731 Human epidermal growth factor receptor 2 positive status: Secondary | ICD-10-CM | POA: Diagnosis present

## 2023-03-20 DIAGNOSIS — N950 Postmenopausal bleeding: Secondary | ICD-10-CM | POA: Insufficient documentation

## 2023-03-20 LAB — FSH/LH
FSH: 25.4 m[IU]/mL
LH: 20.7 m[IU]/mL

## 2023-03-20 LAB — ESTRADIOL: Estradiol: 28 pg/mL

## 2023-03-25 ENCOUNTER — Encounter: Payer: Self-pay | Admitting: Internal Medicine

## 2023-03-25 ENCOUNTER — Encounter: Payer: Self-pay | Admitting: Physician Assistant

## 2023-03-25 NOTE — Telephone Encounter (Signed)
Please see pt message in regards to recent results

## 2023-03-26 ENCOUNTER — Ambulatory Visit: Payer: 59 | Admitting: Radiation Oncology

## 2023-03-27 ENCOUNTER — Encounter: Payer: Self-pay | Admitting: Physician Assistant

## 2023-03-27 ENCOUNTER — Inpatient Hospital Stay: Payer: 59

## 2023-03-27 ENCOUNTER — Inpatient Hospital Stay: Payer: 59 | Admitting: Internal Medicine

## 2023-03-27 ENCOUNTER — Telehealth: Payer: 59 | Admitting: Physician Assistant

## 2023-03-27 DIAGNOSIS — E1165 Type 2 diabetes mellitus with hyperglycemia: Secondary | ICD-10-CM

## 2023-03-27 DIAGNOSIS — R9389 Abnormal findings on diagnostic imaging of other specified body structures: Secondary | ICD-10-CM

## 2023-03-27 DIAGNOSIS — C50911 Malignant neoplasm of unspecified site of right female breast: Secondary | ICD-10-CM | POA: Diagnosis not present

## 2023-03-27 DIAGNOSIS — Z1731 Human epidermal growth factor receptor 2 positive status: Secondary | ICD-10-CM

## 2023-03-27 DIAGNOSIS — N950 Postmenopausal bleeding: Secondary | ICD-10-CM

## 2023-03-27 DIAGNOSIS — E782 Mixed hyperlipidemia: Secondary | ICD-10-CM

## 2023-03-27 DIAGNOSIS — E559 Vitamin D deficiency, unspecified: Secondary | ICD-10-CM | POA: Diagnosis not present

## 2023-03-27 DIAGNOSIS — M1000 Idiopathic gout, unspecified site: Secondary | ICD-10-CM

## 2023-03-27 MED ORDER — VITAMIN D (ERGOCALCIFEROL) 1.25 MG (50000 UNIT) PO CAPS: 50000.0000 [IU] | ORAL_CAPSULE | ORAL | 0 refills | Status: DC

## 2023-03-27 MED ORDER — ATORVASTATIN CALCIUM 10 MG PO TABS: 10.0000 mg | ORAL_TABLET | Freq: Every evening | ORAL | 3 refills | Status: DC

## 2023-03-27 NOTE — Patient Instructions (Addendum)
[email or web address]  Start cholesterol med  High dose Vit D  Labs prior to next appt.  Referral to Dr. Kennith [facility]

## 2023-03-27 NOTE — Progress Notes (Signed)
Virtual Visit via Video Note  I connected with  Allison Harper  on 03/27/23 at  1:00 PM EST by a video enabled telemedicine application and verified that I am speaking with the correct person using two identifiers.  Location: Patient: home Provider: Nature conservation officer at [employer] Persons present: Patient and myself   I discussed the limitations of evaluation and management by telemedicine and the availability of in person appointments. The patient expressed understanding and agreed to proceed.   History of Present Illness:  Discussed the use of AI scribe software for clinical note transcription with the patient, who gave verbal consent to proceed.  History of Present Illness   The patient, with a history of breast cancer, lung nodules, and gout, presents with concerns about a recent ultrasound showing a thickened uterine lining.   In addition to the uterine lining issue, the patient has been dealing with elevated cholesterol and glucose levels, as well as a vitamin D deficiency.  The patient also mentions that she has a high-stress job, which she believes may be contributing to her health issues. She expresses a desire to leave this job and find something less stressful.        Observations/Objective:   Gen: Awake, alert, no acute distress Resp: Breathing is even and non-labored Psych: calm/pleasant demeanor Neuro: Alert and Oriented x 3, + facial symmetry, speech is clear.   Assessment and Plan:     Abnormal Uterine Bleeding Recent episode of bleeding after a long period of amenorrhea. Ultrasound revealed abnormally thick uterine lining, raising concern for uterine carcinoma. -Refer to gynecology for further evaluation and possible biopsy of the uterine lining.  US PELVIC COMPLETE WITH TRANSVAGINAL CLINICAL DATA:  Initial evaluation for postmenopausal bleeding.  EXAM: TRANSABDOMINAL AND TRANSVAGINAL ULTRASOUND OF PELVIS  TECHNIQUE: Both transabdominal  and transvaginal ultrasound examinations of the pelvis were performed. Transabdominal technique was performed for global imaging of the pelvis including uterus, ovaries, adnexal regions, and pelvic cul-de-sac. It was necessary to proceed with endovaginal exam following the transabdominal exam to visualize the endometrium.  COMPARISON:  None Available.  FINDINGS: Uterus  Measurements: 7.3 x 3.8 x 4.5 cm = volume: 66.0 mL. Uterus is anteverted. No discrete fibroid or other myometrial abnormality.  Endometrium  Thickness: 21 mm. Scattered areas of internal vascularity seen within the thickened endometrial complex. No visible focal abnormality.  Right ovary  Measurements: 2.5 x 1.7 x 2.1 cm = volume: 5.0 mL. Normal appearance/no adnexal mass.  Left ovary  Measurements: 2.7 x 1.7 x 1.7 cm = volume: 4.0 mL. Normal appearance/no adnexal mass.  Other findings  No abnormal free fluid.  IMPRESSION: 1. Abnormally thickened endometrial complex measuring up to 21 mm with scattered areas of internal vascularity. In the setting of post-menopausal bleeding, endometrial sampling is indicated to exclude carcinoma. If results are benign, sonohysterogram should be considered for focal lesion work-up. (Ref: Radiological Reasoning: Algorithmic Workup of Abnormal Vaginal Bleeding with Endovaginal Sonography and Sonohysterography. AJR 2008; 409:W11-91). 2. Otherwise unremarkable and normal pelvic ultrasound.  Electronically Signed   By: Rise Mu M.D.   On: 03/20/2023 19:27    Hyperlipidemia Newly diagnosed with high cholesterol. Discussed the benefits of starting cholesterol medication along with lifestyle modifications. -Start low-dose statin therapy (10-20mg ). -Encourage dietary changes, including increased intake of omega-3s and nuts, and decreased intake of high-fat meats. -Recheck lipid panel in 3-4 months. The 10-year ASCVD risk score (Arnett DK, et al., 2019) is:  10.7%   Values used  to calculate the score:     Age: 52 years     Sex: Female     Is Non-Hispanic African American: Yes     Diabetic: Yes     Tobacco smoker: No     Systolic Blood Pressure: 137 mmHg     Is BP treated: No     HDL Cholesterol: 46.4 mg/dL     Total Cholesterol: 227 mg/dL   [id number] Recent improvement in glucose levels with metformin and lifestyle modifications, including increased physical activity and dietary changes. -Continue metformin. -Encourage continuation of lifestyle modifications. -Recheck A1C in 3-4 months.  Vitamin D Deficiency Known history of low vitamin D levels. Discussed the benefits of supplementation and dietary changes. -Prescribe high-dose vitamin D supplementation to start, then switch to over-the-counter daily supplementation. -Encourage dietary changes, including increased intake of vitamin D-rich foods.  Gout Elevated uric acid levels, currently managed with allopurinol. -Continue allopurinol at current dose. -Monitor for gout flare-ups.  Follow-up in 3-4 months to reassess cholesterol, glucose levels, and overall health status.        Follow Up Instructions:    I discussed the assessment and treatment plan with the patient. The patient was provided an opportunity to ask questions and all were answered. The patient agreed with the plan and demonstrated an understanding of the instructions.   The patient was advised to call back or seek an in-person evaluation if the symptoms worsen or if the condition fails to improve as anticipated.  Bernadean Saling M Caron Ode, PA-C

## 2023-03-28 NOTE — Telephone Encounter (Signed)
Please see patient message and advise.

## 2023-03-31 ENCOUNTER — Other Ambulatory Visit: Payer: Self-pay

## 2023-03-31 DIAGNOSIS — N950 Postmenopausal bleeding: Secondary | ICD-10-CM

## 2023-03-31 DIAGNOSIS — Z1731 Human epidermal growth factor receptor 2 positive status: Secondary | ICD-10-CM

## 2023-03-31 NOTE — Telephone Encounter (Signed)
Spoke with [facility] referral coordinator and referral entered again and per [place] sent to correct provider. Pt scheduled with [facility] Hines for 04/01/23

## 2023-04-01 ENCOUNTER — Ambulatory Visit (INDEPENDENT_AMBULATORY_CARE_PROVIDER_SITE_OTHER): Payer: 59 | Admitting: Obstetrics and Gynecology

## 2023-04-01 ENCOUNTER — Encounter: Payer: Self-pay | Admitting: Obstetrics and Gynecology

## 2023-04-01 ENCOUNTER — Encounter: Payer: Self-pay | Admitting: Internal Medicine

## 2023-04-01 VITALS — BP 104/68 | HR 70 | Ht 69.29 in | Wt 261.0 lb

## 2023-04-01 DIAGNOSIS — N950 Postmenopausal bleeding: Secondary | ICD-10-CM | POA: Diagnosis not present

## 2023-04-01 NOTE — Assessment & Plan Note (Signed)
TVUS with 21mm endometrial thickness FSH high, estradiol low, suggestive of menopause Reviewed causes of PMB, including genitourinary syndrome of menopause, infection, trauma, polyps, urinary and GI etiologies, and malignancy. Recommend endometrial sampling- via EMB or diagnostic hysteroscopy. Patient elects for EMB.

## 2023-04-01 NOTE — Patient Instructions (Signed)
Please ibuprofen 600mg  30 min before your procedures.   Endometrial biopsy aftercare instructions: It is common to have vaginal bleeding and cramping for up to 72 hours after your biopsy. Please call our office with heavy vaginal bleeding, severe abdominal pain or fever. Avoid intercourse, tampon use, douching and baths for 7 days to decrease the risk of infection.

## 2023-04-01 NOTE — Progress Notes (Unsigned)
52 y.o. No obstetric history on file. female with history of breast cancer, lung cancer, and iatrogenic menopause here for PMB.  Presents with her wife.  Pt states has thick endometrium and possible need a biopsy.   Patient reports that she was cycling in 2022 prior to her breast cancer diagnosis.  Her cycles were very irregular and she often skipped 3 to 77-month at a time.  Menarche 52 years old. She started chemotherapy in 2023 and never had another cycle. However, she had prolonged bleeding in October for several days. She is not on medical chemoprophylaxis.  03/20/2023 TVUS: ET 21 mm vascularity.  Patient's last menstrual period was 03/30/2023 (approximate).   Sexually active: Yes, same-sex relationship.  GYN HISTORY: History of breast cancer, dx 2022 s/p left mastectomy, chemoRT  OB History  No obstetric history on file.    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, unspecified 05/06/2010    Past Surgical History:  Procedure Laterality Date   CHOLECYSTECTOMY     CYST EXCISION  05/06/2006   from back   LIPOMA EXCISION Left 09/04/2010   flank   LYMPH NODE BIOPSY Left 01/23/2023   Procedure: LYMPH NODE BIOPSY;  Surgeon: Loreli Slot, MD;  Location: [facility] OR;  Service: Thoracic;  Laterality: Left;   MASTECTOMY Right 2022   PORT-A-CATH REMOVAL Left 01/23/2023   Procedure: REMOVAL PORT-A-CATH;  Surgeon: Loreli Slot, MD;  Location: [facility] OR;  Service: Thoracic;  Laterality: Left;   TONSILLECTOMY      Current Outpatient Medications on File Prior to Visit  Medication Sig Dispense Refill   allopurinol (ZYLOPRIM) 100 MG tablet Take 1 tablet (100 mg total) by mouth daily. Take for gout prevention. 90 tablet 3   atorvastatin (LIPITOR) 10 MG tablet  Take 1 tablet (10 mg total) by mouth every evening. 90 tablet 3   colchicine 0.6 MG tablet Take 1 tablet (0.6 mg total) by mouth daily as needed (gout flares). 30 tablet 2   gabapentin (NEURONTIN) 300 MG capsule Take 1 capsule (300 mg total) by mouth 2 (two) times daily. 60 capsule 3   metFORMIN (GLUCOPHAGE-XR) 750 MG 24 hr tablet Take 1 tablet (750 mg total) by mouth daily with breakfast. 90 tablet 3   Multiple Vitamins-Minerals (MULTIVITAMIN WITH MINERALS) tablet Take 1 tablet by mouth daily.     OVER THE COUNTER MEDICATION Take 2 capsules by mouth daily. [place] Tail Mushrooms     Vitamin D, Ergocalciferol, (DRISDOL) 1.25 MG (50000 UNIT) CAPS capsule Take 1 capsule (50,000 Units total) by mouth every 7 (seven) days. 12 capsule 0   Current Facility-Administered Medications on File Prior to Visit  Medication Dose Route Frequency Provider Last Rate Last Admin   heparin lock flush 100 UNIT/ML injection             Allergies  Allergen Reactions   Copper Cu 64 Dotatate Diarrhea, Nausea Only, Rash and Other (See Comments)    Chest pain   Hydrocodone-Acetaminophen Itching and Rash      PE Today's Vitals   04/01/23 1402  BP: 104/68  Pulse: 70  SpO2: 98%  Weight: 261 lb (118.4 kg)  Height: 5' 9.29" (1.76 m)   Body mass index is 38.22 kg/m.  Physical Exam Vitals reviewed.  Constitutional:      General: She is not in acute distress.    Appearance: Normal appearance.  HENT:     Head: Normocephalic and atraumatic.     Nose: Nose normal.  Eyes:     Extraocular Movements: Extraocular movements intact.     Conjunctiva/sclera: Conjunctivae normal.  Pulmonary:     Effort: Pulmonary effort is normal.  Musculoskeletal:        General: Normal range of motion.     Cervical back: Normal range of motion.  Neurological:     General: No focal deficit present.     Mental Status: She is alert.  Psychiatric:        Mood and Affect: Mood normal.        Behavior: Behavior normal.       Assessment and Plan:        PMB (postmenopausal bleeding) Assessment & Plan: Reviewed TVUS with 21mm endometrial thickness Reviewed labs: FSH high, estradiol low > suggestive of menopause Reviewed causes of PMB, including genitourinary syndrome of menopause, infection, trauma, polyps, urinary and GI etiologies, and malignancy. Recommend endometrial sampling- via EMB or diagnostic hysteroscopy. Patient elects for EMB.  RTO for procedurel  Orders: -     Endometrial biopsy; Future     Rosalyn Gess, MD

## 2023-04-02 ENCOUNTER — Ambulatory Visit (HOSPITAL_BASED_OUTPATIENT_CLINIC_OR_DEPARTMENT_OTHER): Payer: 59 | Admitting: Certified Nurse Midwife

## 2023-04-08 ENCOUNTER — Ambulatory Visit (INDEPENDENT_AMBULATORY_CARE_PROVIDER_SITE_OTHER): Payer: 59 | Admitting: Obstetrics and Gynecology

## 2023-04-08 ENCOUNTER — Ambulatory Visit: Payer: 59 | Admitting: Obstetrics and Gynecology

## 2023-04-08 ENCOUNTER — Other Ambulatory Visit (HOSPITAL_COMMUNITY)
Admission: RE | Admit: 2023-04-08 | Discharge: 2023-04-08 | Disposition: A | Discharge status: Home / Self Care | Payer: 59 | Source: Ambulatory Visit | Attending: Obstetrics and Gynecology | Admitting: Obstetrics and Gynecology

## 2023-04-08 ENCOUNTER — Encounter: Payer: Self-pay | Admitting: Obstetrics and Gynecology

## 2023-04-08 VITALS — BP 122/74 | HR 84 | Wt 261.0 lb

## 2023-04-08 DIAGNOSIS — N950 Postmenopausal bleeding: Secondary | ICD-10-CM

## 2023-04-08 DIAGNOSIS — N8502 Endometrial intraepithelial neoplasia [EIN]: Secondary | ICD-10-CM

## 2023-04-08 MED ORDER — LIDOCAINE HCL (PF) 1 % IJ SOLN: 10.0000 mL | Freq: Once | INTRAMUSCULAR | Status: AC

## 2023-04-08 NOTE — Patient Instructions (Signed)
It is common to have vaginal bleeding and cramping for up to 72 hours after your biopsy. Please call our office with heavy vaginal bleeding, severe abdominal pain or fever. Avoid intercourse, tampon use, douching and baths for 7 days to decrease the risk of infection.

## 2023-04-08 NOTE — Progress Notes (Signed)
52 y.o. female with history of breast cancer, lung cancer, and iatrogenic menopause, PMB her for EMB.  At 04/01/23 appt, she reported: "that she was cycling in 2022 prior to her breast cancer diagnosis.  Her cycles were very irregular and she often skipped 3 to 57-month at a time.  Menarche 52 years old. She started chemotherapy in 2023 and never had another cycle. However, she had prolonged bleeding in October for several days. She is not on medical chemoprophylaxis."  03/20/2023 TVUS: ET 21 mm vascularity. No complaints today  Patient's last menstrual period was 03/30/2023 (approximate).   Sexually active: Yes, same-sex relationship.  GYN HISTORY: History of breast cancer, dx 2022 s/p left mastectomy, chemoRT  OB History  No obstetric history on file.    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, unspecified 05/06/2010    Past Surgical History:  Procedure Laterality Date   CHOLECYSTECTOMY     CYST EXCISION  05/06/2006   from back   LIPOMA EXCISION Left 09/04/2010   flank   LYMPH NODE BIOPSY Left 01/23/2023   Procedure: LYMPH NODE BIOPSY;  Surgeon: Loreli Slot, MD;  Location: [facility] OR;  Service: Thoracic;  Laterality: Left;   MASTECTOMY Right 2022   PORT-A-CATH REMOVAL Left 01/23/2023   Procedure: REMOVAL PORT-A-CATH;  Surgeon: Loreli Slot, MD;  Location: [facility] OR;  Service: Thoracic;  Laterality: Left;   TONSILLECTOMY      Current Outpatient Medications on File Prior to Visit  Medication Sig Dispense Refill   allopurinol (ZYLOPRIM) 100 MG tablet Take 1 tablet (100 mg total) by mouth daily. Take for gout prevention. 90 tablet 3   atorvastatin (LIPITOR) 10 MG tablet Take 1 tablet (10 mg total) by mouth every evening. 90 tablet 3   colchicine  0.6 MG tablet Take 1 tablet (0.6 mg total) by mouth daily as needed (gout flares). 30 tablet 2   gabapentin (NEURONTIN) 300 MG capsule Take 1 capsule (300 mg total) by mouth 2 (two) times daily. 60 capsule 3   metFORMIN (GLUCOPHAGE-XR) 750 MG 24 hr tablet Take 1 tablet (750 mg total) by mouth daily with breakfast. 90 tablet 3   Multiple Vitamins-Minerals (MULTIVITAMIN WITH MINERALS) tablet Take 1 tablet by mouth daily.     OVER THE COUNTER MEDICATION Take 2 capsules by mouth daily. [place] Mushrooms     Vitamin D, Ergocalciferol, (DRISDOL) 1.25 MG (50000 UNIT) CAPS capsule Take 1 capsule (50,000 Units total) by mouth every 7 (seven) days. 12 capsule 0   Current Facility-Administered Medications on File Prior to Visit  Medication Dose Route Frequency Provider Last Rate Last Admin   heparin lock flush 100 UNIT/ML injection             Allergies  Allergen Reactions   Copper Cu 64 Dotatate Diarrhea, Nausea Only, Rash and Other (See Comments)    Chest pain   Hydrocodone-Acetaminophen Itching and Rash      PE Today's Vitals   04/08/23 1141  BP: 122/74  Pulse: 84  SpO2: 98%  Weight: 261 lb (118.4 kg)   Body mass index is 38.22 kg/m.  Physical Exam Vitals reviewed. Exam conducted with a chaperone present.  Constitutional:      General: She is not in acute distress.  Appearance: Normal appearance.  HENT:     Head: Normocephalic and atraumatic.     Nose: Nose normal.  Eyes:     Extraocular Movements: Extraocular movements intact.     Conjunctiva/sclera: Conjunctivae normal.  Pulmonary:     Effort: Pulmonary effort is normal.  Genitourinary:    General: Normal vulva.     Exam position: Lithotomy position.     Vagina: Normal. No vaginal discharge.     Cervix: Normal. No cervical motion tenderness, discharge or lesion.     Uterus: Normal. Not enlarged and not tender.      Adnexa: Right adnexa normal and left adnexa normal.  Musculoskeletal:        General: Normal range of  motion.     Cervical back: Normal range of motion.  Neurological:     General: No focal deficit present.     Mental Status: She is alert.  Psychiatric:        Mood and Affect: Mood normal.        Behavior: Behavior normal.      Procedure Speculum inserted into the vagina, cervix visualized and was prepped with Betadine.  Cervical block was performed with 10cc 1% lidocaine (Lot#: [id number], Exp 04/2024). A single-toothed tenaculum was placed on the anterior lip of the cervix to stabilize it.  The 3 mm pipelle was introduced into the endometrial cavity without difficulty to a depth of 9cm, suction initiated and a moderate amount of tissue was obtained over 2 passes and sent to pathology.  The instruments were removed from the patient's vagina.  Minimal bleeding from the cervix was noted.  The patient tolerated the procedure well.    Assessment and Plan:        PMB (postmenopausal bleeding) -     Lidocaine HCl (PF) -     Surgical pathology  Uncomplicated endometrial biopsy Will call with results   Rosalyn Gess, MD

## 2023-04-11 LAB — SURGICAL PATHOLOGY

## 2023-04-14 DIAGNOSIS — N8502 Endometrial intraepithelial neoplasia [EIN]: Secondary | ICD-10-CM | POA: Insufficient documentation

## 2023-04-14 NOTE — Addendum Note (Signed)
Addended by: Rosalyn Gess on: 04/14/2023 08:35 AM   Modules accepted: Orders

## 2023-04-15 ENCOUNTER — Other Ambulatory Visit: Payer: Self-pay

## 2023-04-15 ENCOUNTER — Telehealth: Payer: Self-pay

## 2023-04-15 DIAGNOSIS — E782 Mixed hyperlipidemia: Secondary | ICD-10-CM

## 2023-04-15 DIAGNOSIS — M1000 Idiopathic gout, unspecified site: Secondary | ICD-10-CM

## 2023-04-15 DIAGNOSIS — E1165 Type 2 diabetes mellitus with hyperglycemia: Secondary | ICD-10-CM

## 2023-04-15 MED ORDER — METFORMIN HCL ER 750 MG PO TB24: 750.0000 mg | ORAL_TABLET | Freq: Every day | ORAL | 3 refills | Status: DC

## 2023-04-15 MED ORDER — ATORVASTATIN CALCIUM 10 MG PO TABS: 10.0000 mg | ORAL_TABLET | Freq: Every evening | ORAL | 3 refills | Status: DC

## 2023-04-15 MED ORDER — ALLOPURINOL 100 MG PO TABS: 100.0000 mg | ORAL_TABLET | Freq: Every day | ORAL | 3 refills | Status: DC

## 2023-04-15 NOTE — Telephone Encounter (Signed)
Spoke with [name] regarding her referral to GYN oncology. She has an appointment scheduled with Dr. Pricilla Holm on 05/01/23 at 9:00am. Patient agrees to date and time. She has been provided with office address and location. She is also aware of our mask and visitor policy. Patient verbalized understanding and will call with any questions.

## 2023-04-24 ENCOUNTER — Encounter: Payer: Self-pay | Admitting: Gynecologic Oncology

## 2023-04-27 NOTE — Progress Notes (Unsigned)
GYNECOLOGIC ONCOLOGY NEW PATIENT CONSULTATION   Patient Name: Allison Harper  Patient Age: 52 y.o. Date of Service: 04/28/23 Referring Provider: Dr. 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    Patient desires to proceed with surgical management.    Although no chart history of diabetes, reviewed her recent hemoglobin A1c which was just over 8%.  She endorses hyperglycemia at times over the last several years.  Encouraged her to start checking sugars at home.  Ideally, her sugar should be less than 180 in the peri-operative period.  Hormone levels in November would support postmenopausal status with an estradiol of 28 and FSH of 25.4.  The patient is a suitable candidate for hysterectomy via a minimally invasive approach to surgery.  Given that she is postmenopausal, a bilateral salpingo-oophorectomy is also recommended.  We reviewed that robotic assistance would be used to complete the surgery.  We discussed that endometrial cancer is detected in about 40% of final uterine pathology specimens from patients with EIN.    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  The first would be to plan to send the uterus for intraoperative frozen pathology.  If cancer is identified at the time of surgery, additional procedures including lymph node evaluation, is recommended.  The second would be to plan for sentinel lymph node biopsy.  I stressed that removal of lymph nodes would be unnecessary in the setting of EIN.  We reviewed the sentinel lymph node technique. Risks and benefits of sentinel lymph node biopsy was  reviewed. We reviewed the technique and ICG dye. The patient DOES NOT have an iodine allergy or known liver dysfunction. We reviewed the false negative rate (0.4%), and that 3% of patients with metastatic disease will not have it detected by SLN biopsy in endometrial cancer. A low risk of allergic reaction to the dye, <0.2% for ICG, has been reported. We also discussed that in the case of failed mapping, which occurs 40% of the time, a bilateral or unilateral lymphadenectomy will be performed at the surgeon's discretion.   Potential benefits of sentinel nodes including a higher detection rate for metastasis due to ultrastaging and potential reduction in operative morbidity. However, there remains uncertainty as to the role for treatment of micrometastatic disease. Further, the benefit of operative morbidity associated with the SLN technique in endometrial cancer is not yet completely known. In other patient populations (e.g. the cervical cancer population) there has been observed reductions in morbidity with SLN biopsy compared to pelvic lymphadenectomy. Lymphedema, nerve dysfunction and lymphocysts are all potential risks with the SLN technique as with complete lymphadenectomy. Additional risks to the patient include the risk of damage to an internal organ while operating in an altered view (e.g. the black and white image of the robotic fluorescence imaging mode).   After our discussion, the patient was interested in proceeding with sentinel lymph node biopsy at the time of surgery.  We discussed plan for a robotic assisted hysterectomy, bilateral salpingo-oophorectomy, sentinel lymph node evaluation, possible lymph node dissection, possible laparotomy. The risks of surgery were discussed in detail and she understands these to include infection; wound separation; hernia; vaginal cuff separation, injury to adjacent organs such as bowel, bladder, blood  vessels, ureters and nerves; bleeding which may require blood  transfusion; anesthesia risk; thromboembolic events; possible death; unforeseen complications; possible need for re-exploration; medical complications such as heart attack, stroke, pleural effusion and pneumonia; and, if full lymphadenectomy is performed the risk of lymphedema and lymphocyst. The patient will receive DVT and antibiotic prophylaxis as indicated. She voiced a clear understanding. She had the opportunity to ask questions. Perioperative instructions were reviewed with her. Prescriptions for post-op medications were sent to her pharmacy of choice.  We discussed medical management of her EIN.  While she would be a candidate for progesterone therapy (on review of her pathology again after our visit, I confirmed that her tumor was progesterone receptor negative), her strong preference is to proceed with definitive surgical management.  With good glycemic control in the perioperative period, I think she is a good candidate for definitive surgery.  She voices some anxiety and difficulty coping with now a third cancer diagnosis.  We discussed available support here at the cancer center.  My recommendation was referral to social work to discuss individual needs as well as our support groups and our peer mentorship program.  Patient was open to this and referral placed today.  Based is on her work schedule, surgery was tentatively scheduled for late January.  She will benefit from referral to genetics.  A copy of this note was sent to the patient's referring provider.   75 minutes of total time was spent for this patient encounter, including preparation, face-to-face counseling with the patient and coordination of care, and documentation of the encounter.  Eugene Garnet, MD  Division of Gynecologic Oncology  Department of Obstetrics and Gynecology  [facility]  ___________________________________________  Chief Complaint: No chief complaint on file.   History  of Present Illness:  Allison Harper is a 52 y.o. y.o. female who is seen in consultation at the request of Dr. Kennith Center for an evaluation of endometrial intraepithelial neoplasia.  Patient was having menstrual cycles until her breast cancer diagnosis in 2022.  She describes these as very irregular, often skipping up to 3-6 months.  She denies any menstrual cycles since starting chemotherapy but had a week long episode of bleeding in October 2024 similar to a menses.  She denies any bleeding after that until her endometrial biopsy.  Pelvic ultrasound exam on 11/14 showed a uterus measuring 7.3 x 3.8 x 4.5 cm with an endometrium measuring 21 mm with scattered areas of internal vascularity.  Bilateral ovaries normal in appearance.  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  Since her biopsy, she notes bleeding that is sometimes spotting and at other times heavier bleeding.  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  She also has a history of stage IIa invasive mammary carcinoma status post modified right radical mastectomy and limited lymph node dissection, ER low positive/PR negative. Adjuvant therapy included RT and Trastuzumab-Perjeta/Phesgo x6 followed by Phesgo. At her last visit in 12/2022, there was mention in the note of anti-hormonal therapy with tamoxifen given premenopausal status.  The patient is unaware of any discussion regarding initiation of antihormonal therapy.  Last hemoglobin A1c was 8.1% in November.  She denies any diagnosis of diabetes but during breast cancer treatment and while getting scans was noted to have high blood sugar on multiple occasions.  She has a meter at home.  Endorses normal  bowel and bladder function.  Patient denies any family history of cancer although only knows her maternal family history.  PAST  MEDICAL HISTORY:  Past Medical History:  Diagnosis Date   Anemia 2023   Breast cancer (HCC)    Breast screening, unspecified 05/06/2010   Carcinoid tumor    Diabetes mellitus without complication (HCC)    Obesity 05/06/2010   Other benign neoplasm of connective and other soft tissue of trunk, unspecified 05/06/2010     PAST SURGICAL HISTORY:  Past Surgical History:  Procedure Laterality Date   CHOLECYSTECTOMY     CYST EXCISION  05/06/2006   from back   LIPOMA EXCISION Left 09/04/2010   flank   LYMPH NODE BIOPSY Left 01/23/2023   Procedure: LYMPH NODE BIOPSY;  Surgeon: Loreli Slot, MD;  Location[facility];  Service: Thoracic;  Laterality: Left;   MASTECTOMY Right 2022   PORT-A-CATH REMOVAL Left 01/23/2023   Procedure: REMOVAL PORT-A-CATH;  Surgeon: Loreli Slot, MD;  Location: [facility] OR;  Service: Thoracic;  Laterality: Left;   TONSILLECTOMY      OB/GYN HISTORY:  OB History  Gravida Para Term Preterm AB Living  0 0 0 0 0 0  SAB IAB Ectopic Multiple Live Births  0 0 0 0 0    Patient's last menstrual period was 03/30/2023 (approximate).  Age at menarche: 82  Hx of STDs: Denies Last pap: 03/2023 - NIML, HR HPV negative History of abnormal pap smears: yes  SCREENING STUDIES:  Last mammogram: 2021  Last colonoscopy: has not had  MEDICATIONS: Outpatient Encounter Medications as of 04/28/2023  Medication Sig   allopurinol (ZYLOPRIM) 100 MG tablet Take 1 tablet (100 mg total) by mouth daily. Take for gout prevention.   atorvastatin (LIPITOR) 10 MG tablet Take 1 tablet (10 mg total) by mouth every evening.   colchicine 0.6 MG tablet Take 1 tablet (0.6 mg total) by mouth daily as needed (gout flares).   gabapentin (NEURONTIN) 300 MG capsule Take 1 capsule (300 mg total) by mouth 2 (two) times daily. (Patient taking differently: Take 300 mg by mouth 2 (two) times daily. Pt takes once daily)   metFORMIN (GLUCOPHAGE-XR) 750 MG 24 hr tablet Take 1 tablet (750 mg  total) by mouth daily with breakfast.   Multiple Vitamins-Minerals (MULTIVITAMIN WITH MINERALS) tablet Take 1 tablet by mouth daily.   OVER THE COUNTER MEDICATION Take 2 capsules by mouth daily. [place] Tail Mushrooms   Vitamin D, Ergocalciferol, (DRISDOL) 1.25 MG (50000 UNIT) CAPS capsule Take 1 capsule (50,000 Units total) by mouth every 7 (seven) days.   Facility-Administered Encounter Medications as of 04/28/2023  Medication   heparin lock flush 100 UNIT/ML injection   lidocaine (PF) (XYLOCAINE) 1 % injection 10 mL    ALLERGIES:  Allergies  Allergen Reactions   Copper Cu 64 Dotatate Diarrhea, Nausea Only, Rash and Other (See Comments)    Chest pain     FAMILY HISTORY:  Family History  Problem Relation Age of Onset   Breast cancer Neg Hx      SOCIAL HISTORY:  Social Connections: Unknown (09/03/2021)   Received from [facility]   Social Network    Social Network: Not on file    REVIEW OF SYSTEMS:  + vaginal bleeding Denies appetite changes, fevers, chills, fatigue, unexplained weight changes. Denies hearing loss, neck lumps or masses, mouth sores, ringing in ears or voice changes. Denies cough or wheezing.  Denies shortness of breath. Denies chest pain or palpitations. Denies  leg swelling. Denies abdominal distention, pain, blood in stools, constipation, diarrhea, nausea, vomiting, or early satiety. Denies pain with intercourse, dysuria, frequency, hematuria or incontinence. Denies hot flashes, pelvic pain or vaginal discharge.   Denies joint pain, back pain or muscle pain/cramps. Denies itching, rash, or wounds. Denies dizziness, headaches, numbness or seizures. Denies swollen lymph nodes or glands, denies easy bruising or bleeding. Denies anxiety, depression, confusion, or decreased concentration.  Physical Exam:  Vital Signs for this encounter:  Blood pressure (!) 143/74, pulse 73, temperature 98.7 F (37.1 C), temperature source Oral, resp. rate  16, height 5\' 9"  (1.753 m), weight 257 lb (116.6 kg), last menstrual period 03/30/2023, SpO2 100%. Body mass index is 37.95 kg/m. General: Alert, oriented, no acute distress.  HEENT: Normocephalic, atraumatic. Sclera anicteric.  Chest: Clear to auscultation bilaterally. No wheezes, rhonchi, or rales. Cardiovascular: Regular rate and rhythm, no murmurs, rubs, or gallops.  Abdomen: Obese. Normoactive bowel sounds. Soft, nondistended, nontender to palpation. No masses or hepatosplenomegaly appreciated. No palpable fluid wave.  Extremities: Grossly normal range of motion. Warm, well perfused. No edema bilaterally.  Skin: No rashes or lesions.  Lymphatics: No cervical, supraclavicular, or inguinal adenopathy.  GU:  Normal external female genitalia. No lesions. No discharge or bleeding.             Bladder/urethra:  No lesions or masses, well supported bladder             Vagina: Well rugated, no lesions.             Cervix: Normal appearing, no lesions.             Uterus: Small, mobile, no parametrial involvement or nodularity.             Adnexa: No masses appreciated.  Rectal: Deferred.  LABORATORY AND RADIOLOGIC DATA:  Outside medical records were reviewed to synthesize the above history, along with the history and physical obtained during the visit.   Lab Results  Component Value Date   WBC 6.8 03/19/2023   HGB 12.6 03/19/2023   HCT 39.2 03/19/2023   PLT 245.0 03/19/2023   GLUCOSE 161 (H) 03/19/2023   CHOL 227 (H) 03/19/2023   TRIG 159.0 (H) 03/19/2023   HDL 46.40 03/19/2023   LDLCALC 149 (H) 03/19/2023   ALT 15 03/19/2023   AST 22 03/19/2023   NA 138 03/19/2023   K 4.3 03/19/2023   CL 102 03/19/2023   CREATININE 0.69 03/19/2023   BUN 10 03/19/2023   CO2 26 03/19/2023   TSH 1.94 03/19/2023   INR 0.9 01/22/2023   HGBA1C 8.1 (H) 03/19/2023   MICROALBUR <0.7 03/12/2023

## 2023-04-27 NOTE — H&P (View-Only) (Signed)
GYNECOLOGIC ONCOLOGY NEW PATIENT CONSULTATION   Patient Name: Allison Harper  Patient Age: 52 y.o. Date of Service: 04/28/23 Referring Provider: Dr. 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    Patient desires to proceed with surgical management.    Although no chart history of diabetes, reviewed her recent hemoglobin A1c which was just over 8%.  She endorses hyperglycemia at times over the last several years.  Encouraged her to start checking sugars at home.  Ideally, her sugar should be less than 180 in the peri-operative period.  Hormone levels in November would support postmenopausal status with an estradiol of 28 and FSH of 25.4.  The patient is a suitable candidate for hysterectomy via a minimally invasive approach to surgery.  Given that she is postmenopausal, a bilateral salpingo-oophorectomy is also recommended.  We reviewed that robotic assistance would be used to complete the surgery.  We discussed that endometrial cancer is detected in about 40% of final uterine pathology specimens from patients with EIN.    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  The first would be to plan to send the uterus for intraoperative frozen pathology.  If cancer is identified at the time of surgery, additional procedures including lymph node evaluation, is recommended.  The second would be to plan for sentinel lymph node biopsy.  I stressed that removal of lymph nodes would be unnecessary in the setting of EIN.  We reviewed the sentinel lymph node technique. Risks and benefits of sentinel lymph node biopsy was  reviewed. We reviewed the technique and ICG dye. The patient DOES NOT have an iodine allergy or known liver dysfunction. We reviewed the false negative rate (0.4%), and that 3% of patients with metastatic disease will not have it detected by SLN biopsy in endometrial cancer. A low risk of allergic reaction to the dye, <0.2% for ICG, has been reported. We also discussed that in the case of failed mapping, which occurs 40% of the time, a bilateral or unilateral lymphadenectomy will be performed at the surgeon's discretion.   Potential benefits of sentinel nodes including a higher detection rate for metastasis due to ultrastaging and potential reduction in operative morbidity. However, there remains uncertainty as to the role for treatment of micrometastatic disease. Further, the benefit of operative morbidity associated with the SLN technique in endometrial cancer is not yet completely known. In other patient populations (e.g. the cervical cancer population) there has been observed reductions in morbidity with SLN biopsy compared to pelvic lymphadenectomy. Lymphedema, nerve dysfunction and lymphocysts are all potential risks with the SLN technique as with complete lymphadenectomy. Additional risks to the patient include the risk of damage to an internal organ while operating in an altered view (e.g. the black and white image of the robotic fluorescence imaging mode).   After our discussion, the patient was interested in proceeding with sentinel lymph node biopsy at the time of surgery.  We discussed plan for a robotic assisted hysterectomy, bilateral salpingo-oophorectomy, sentinel lymph node evaluation, possible lymph node dissection, possible laparotomy. The risks of surgery were discussed in detail and she understands these to include infection; wound separation; hernia; vaginal cuff separation, injury to adjacent organs such as bowel, bladder, blood  vessels, ureters and nerves; bleeding which may require blood  transfusion; anesthesia risk; thromboembolic events; possible death; unforeseen complications; possible need for re-exploration; medical complications such as heart attack, stroke, pleural effusion and pneumonia; and, if full lymphadenectomy is performed the risk of lymphedema and lymphocyst. The patient will receive DVT and antibiotic prophylaxis as indicated. She voiced a clear understanding. She had the opportunity to ask questions. Perioperative instructions were reviewed with her. Prescriptions for post-op medications were sent to her pharmacy of choice.  We discussed medical management of her EIN.  While she would be a candidate for progesterone therapy (on review of her pathology again after our visit, I confirmed that her tumor was progesterone receptor negative), her strong preference is to proceed with definitive surgical management.  With good glycemic control in the perioperative period, I think she is a good candidate for definitive surgery.  She voices some anxiety and difficulty coping with now a third cancer diagnosis.  We discussed available support here at the cancer center.  My recommendation was referral to social work to discuss individual needs as well as our support groups and our peer mentorship program.  Patient was open to this and referral placed today.  Based is on her work schedule, surgery was tentatively scheduled for late January.  She will benefit from referral to genetics.  A copy of this note was sent to the patient's referring provider.   75 minutes of total time was spent for this patient encounter, including preparation, face-to-face counseling with the patient and coordination of care, and documentation of the encounter.  Eugene Garnet, MD  Division of Gynecologic Oncology  Department of Obstetrics and Gynecology  [facility]  ___________________________________________  Chief Complaint: No chief complaint on file.   History  of Present Illness:  Allison Harper is a 52 y.o. y.o. female who is seen in consultation at the request of Dr. Kennith Center for an evaluation of endometrial intraepithelial neoplasia.  Patient was having menstrual cycles until her breast cancer diagnosis in 2022.  She describes these as very irregular, often skipping up to 3-6 months.  She denies any menstrual cycles since starting chemotherapy but had a week long episode of bleeding in October 2024 similar to a menses.  She denies any bleeding after that until her endometrial biopsy.  Pelvic ultrasound exam on 11/14 showed a uterus measuring 7.3 x 3.8 x 4.5 cm with an endometrium measuring 21 mm with scattered areas of internal vascularity.  Bilateral ovaries normal in appearance.  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  Since her biopsy, she notes bleeding that is sometimes spotting and at other times heavier bleeding.  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  She also has a history of stage IIa invasive mammary carcinoma status post modified right radical mastectomy and limited lymph node dissection, ER low positive/PR negative. Adjuvant therapy included RT and Trastuzumab-Perjeta/Phesgo x6 followed by Phesgo. At her last visit in 12/2022, there was mention in the note of anti-hormonal therapy with tamoxifen given premenopausal status.  The patient is unaware of any discussion regarding initiation of antihormonal therapy.  Last hemoglobin A1c was 8.1% in November.  She denies any diagnosis of diabetes but during breast cancer treatment and while getting scans was noted to have high blood sugar on multiple occasions.  She has a meter at home.  Endorses normal  bowel and bladder function.  Patient denies any family history of cancer although only knows her maternal family history.  PAST  MEDICAL HISTORY:  Past Medical History:  Diagnosis Date   Anemia 2023   Breast cancer (HCC)    Breast screening, unspecified 05/06/2010   Carcinoid tumor    Diabetes mellitus without complication (HCC)    Obesity 05/06/2010   Other benign neoplasm of connective and other soft tissue of trunk, unspecified 05/06/2010     PAST SURGICAL HISTORY:  Past Surgical History:  Procedure Laterality Date   CHOLECYSTECTOMY     CYST EXCISION  05/06/2006   from back   LIPOMA EXCISION Left 09/04/2010   flank   LYMPH NODE BIOPSY Left 01/23/2023   Procedure: LYMPH NODE BIOPSY;  Surgeon: Loreli Slot, MD;  Location: [facility] OR;  Service: Thoracic;  Laterality: Left;   MASTECTOMY Right 2022   PORT-A-CATH REMOVAL Left 01/23/2023   Procedure: REMOVAL PORT-A-CATH;  Surgeon: Loreli Slot, MD;  Location: [facility] OR;  Service: Thoracic;  Laterality: Left;   TONSILLECTOMY      OB/GYN HISTORY:  OB History  Gravida Para Term Preterm AB Living  0 0 0 0 0 0  SAB IAB Ectopic Multiple Live Births  0 0 0 0 0    Patient's last menstrual period was 03/30/2023 (approximate).  Age at menarche: 26  Hx of STDs: Denies Last pap: 03/2023 - NIML, HR HPV negative History of abnormal pap smears: yes  SCREENING STUDIES:  Last mammogram: 2021  Last colonoscopy: has not had  MEDICATIONS: Outpatient Encounter Medications as of 04/28/2023  Medication Sig   allopurinol (ZYLOPRIM) 100 MG tablet Take 1 tablet (100 mg total) by mouth daily. Take for gout prevention.   atorvastatin (LIPITOR) 10 MG tablet Take 1 tablet (10 mg total) by mouth every evening.   colchicine 0.6 MG tablet Take 1 tablet (0.6 mg total) by mouth daily as needed (gout flares).   gabapentin (NEURONTIN) 300 MG capsule Take 1 capsule (300 mg total) by mouth 2 (two) times daily. (Patient taking differently: Take 300 mg by mouth 2 (two) times daily. Pt takes once daily)   metFORMIN (GLUCOPHAGE-XR) 750 MG 24 hr tablet Take 1 tablet (750 mg  total) by mouth daily with breakfast.   Multiple Vitamins-Minerals (MULTIVITAMIN WITH MINERALS) tablet Take 1 tablet by mouth daily.   OVER THE COUNTER MEDICATION Take 2 capsules by mouth daily. [place] Tail Mushrooms   Vitamin D, Ergocalciferol, (DRISDOL) 1.25 MG (50000 UNIT) CAPS capsule Take 1 capsule (50,000 Units total) by mouth every 7 (seven) days.   Facility-Administered Encounter Medications as of 04/28/2023  Medication   heparin lock flush 100 UNIT/ML injection   lidocaine (PF) (XYLOCAINE) 1 % injection 10 mL    ALLERGIES:  Allergies  Allergen Reactions   Copper Cu 64 Dotatate Diarrhea, Nausea Only, Rash and Other (See Comments)    Chest pain     FAMILY HISTORY:  Family History  Problem Relation Age of Onset   Breast cancer Neg Hx      SOCIAL HISTORY:  Social Connections: Unknown (09/03/2021)   Received from [facility], [facility]   Social Network    Social Network: Not on file    REVIEW OF SYSTEMS:  + vaginal bleeding Denies appetite changes, fevers, chills, fatigue, unexplained weight changes. Denies hearing loss, neck lumps or masses, mouth sores, ringing in ears or voice changes. Denies cough or wheezing.  Denies shortness of breath. Denies chest pain or palpitations. Denies  leg swelling. Denies abdominal distention, pain, blood in stools, constipation, diarrhea, nausea, vomiting, or early satiety. Denies pain with intercourse, dysuria, frequency, hematuria or incontinence. Denies hot flashes, pelvic pain or vaginal discharge.   Denies joint pain, back pain or muscle pain/cramps. Denies itching, rash, or wounds. Denies dizziness, headaches, numbness or seizures. Denies swollen lymph nodes or glands, denies easy bruising or bleeding. Denies anxiety, depression, confusion, or decreased concentration.  Physical Exam:  Vital Signs for this encounter:  Blood pressure (!) 143/74, pulse 73, temperature 98.7 F (37.1 C), temperature source Oral, resp. rate  16, height 5\' 9"  (1.753 m), weight 257 lb (116.6 kg), last menstrual period 03/30/2023, SpO2 100%. Body mass index is 37.95 kg/m. General: Alert, oriented, no acute distress.  HEENT: Normocephalic, atraumatic. Sclera anicteric.  Chest: Clear to auscultation bilaterally. No wheezes, rhonchi, or rales. Cardiovascular: Regular rate and rhythm, no murmurs, rubs, or gallops.  Abdomen: Obese. Normoactive bowel sounds. Soft, nondistended, nontender to palpation. No masses or hepatosplenomegaly appreciated. No palpable fluid wave.  Extremities: Grossly normal range of motion. Warm, well perfused. No edema bilaterally.  Skin: No rashes or lesions.  Lymphatics: No cervical, supraclavicular, or inguinal adenopathy.  GU:  Normal external female genitalia. No lesions. No discharge or bleeding.             Bladder/urethra:  No lesions or masses, well supported bladder             Vagina: Well rugated, no lesions.             Cervix: Normal appearing, no lesions.             Uterus: Small, mobile, no parametrial involvement or nodularity.             Adnexa: No masses appreciated.  Rectal: Deferred.  LABORATORY AND RADIOLOGIC DATA:  Outside medical records were reviewed to synthesize the above history, along with the history and physical obtained during the visit.   Lab Results  Component Value Date   WBC 6.8 03/19/2023   HGB 12.6 03/19/2023   HCT 39.2 03/19/2023   PLT 245.0 03/19/2023   GLUCOSE 161 (H) 03/19/2023   CHOL 227 (H) 03/19/2023   TRIG 159.0 (H) 03/19/2023   HDL 46.40 03/19/2023   LDLCALC 149 (H) 03/19/2023   ALT 15 03/19/2023   AST 22 03/19/2023   NA 138 03/19/2023   K 4.3 03/19/2023   CL 102 03/19/2023   CREATININE 0.69 03/19/2023   BUN 10 03/19/2023   CO2 26 03/19/2023   TSH 1.94 03/19/2023   INR 0.9 01/22/2023   HGBA1C 8.1 (H) 03/19/2023   MICROALBUR <0.7 03/12/2023

## 2023-04-28 ENCOUNTER — Inpatient Hospital Stay: Payer: 59 | Admitting: Licensed Clinical Social Worker

## 2023-04-28 ENCOUNTER — Inpatient Hospital Stay: Payer: 59 | Admitting: Gynecologic Oncology

## 2023-04-28 ENCOUNTER — Inpatient Hospital Stay: Payer: 59 | Attending: Gynecologic Oncology | Admitting: Gynecologic Oncology

## 2023-04-28 ENCOUNTER — Encounter: Payer: Self-pay | Admitting: Gynecologic Oncology

## 2023-04-28 ENCOUNTER — Telehealth: Payer: Self-pay | Admitting: Licensed Clinical Social Worker

## 2023-04-28 VITALS — BP 143/74 | HR 73 | Temp 98.7°F | Resp 16 | Ht 69.0 in | Wt 257.0 lb

## 2023-04-28 DIAGNOSIS — N950 Postmenopausal bleeding: Secondary | ICD-10-CM | POA: Diagnosis not present

## 2023-04-28 DIAGNOSIS — Z853 Personal history of malignant neoplasm of breast: Secondary | ICD-10-CM | POA: Insufficient documentation

## 2023-04-28 DIAGNOSIS — C541 Malignant neoplasm of endometrium: Secondary | ICD-10-CM

## 2023-04-28 DIAGNOSIS — Z859 Personal history of malignant neoplasm, unspecified: Secondary | ICD-10-CM | POA: Diagnosis not present

## 2023-04-28 DIAGNOSIS — Z780 Asymptomatic menopausal state: Secondary | ICD-10-CM | POA: Insufficient documentation

## 2023-04-28 DIAGNOSIS — Z9221 Personal history of antineoplastic chemotherapy: Secondary | ICD-10-CM | POA: Insufficient documentation

## 2023-04-28 DIAGNOSIS — Z923 Personal history of irradiation: Secondary | ICD-10-CM | POA: Insufficient documentation

## 2023-04-28 DIAGNOSIS — Z9011 Acquired absence of right breast and nipple: Secondary | ICD-10-CM | POA: Insufficient documentation

## 2023-04-28 DIAGNOSIS — N8502 Endometrial intraepithelial neoplasia [EIN]: Secondary | ICD-10-CM | POA: Insufficient documentation

## 2023-04-28 DIAGNOSIS — R739 Hyperglycemia, unspecified: Secondary | ICD-10-CM | POA: Insufficient documentation

## 2023-04-28 DIAGNOSIS — Z79899 Other long term (current) drug therapy: Secondary | ICD-10-CM | POA: Insufficient documentation

## 2023-04-28 DIAGNOSIS — E1165 Type 2 diabetes mellitus with hyperglycemia: Secondary | ICD-10-CM

## 2023-04-28 DIAGNOSIS — Z7984 Long term (current) use of oral hypoglycemic drugs: Secondary | ICD-10-CM | POA: Insufficient documentation

## 2023-04-28 DIAGNOSIS — E669 Obesity, unspecified: Secondary | ICD-10-CM

## 2023-04-28 DIAGNOSIS — F419 Anxiety disorder, unspecified: Secondary | ICD-10-CM | POA: Insufficient documentation

## 2023-04-28 NOTE — Progress Notes (Signed)
Patient here for new patient consultation with Dr. Pricilla Holm and for a pre-operative appointment prior to her scheduled surgery on 05/28/2023. She is scheduled for robotic assisted total laparoscopic hysterectomy, bilateral salpingo-oophorectomy, sentinel lymph node biopsy, possible lymph node dissection, possible laparotomy (larger incision on your abdomen if needed). The surgery was discussed in detail.  See after visit summary for additional details.    Discussed post-op pain management in detail including the aspects of the enhanced recovery pathway.  Advised her that a new prescription would be sent in closer to surgery.  We discussed the use of tylenol post-op and to monitor for a maximum of 4,000 mg in a 24 hour period.  Also plan to prescribe sennakot to be used after surgery and to hold if having loose stools.  Discussed bowel regimen in detail.     Discussed measures to take at home to prevent DVT including frequent mobility.  Reportable signs and symptoms of DVT discussed. Post-operative instructions discussed and expectations for after surgery. Incisional care discussed as well including reportable signs and symptoms including erythema, drainage, wound separation.     10 minutes spent preparing information and with the patient.  Verbalizing understanding of material discussed. No needs or concerns voiced at the end of the visit.   Advised patient to call for any needs.     This appointment is included in the global surgical bundle as pre-operative teaching and has no charge.

## 2023-04-28 NOTE — Telephone Encounter (Signed)
[facility] Clinical Social Work  Clinical Social Work was referred by medical provider for assessment of psychosocial needs.  Clinical Social Worker attempted to contact patient by phone  to offer support and assess for needs.   No answer. Left VM with direct contact information.     Jimeka Balan E Nattalie Santiesteban, LCSW  Clinical Social Worker [facility]         

## 2023-04-28 NOTE — Progress Notes (Signed)
[facility] Clinical Social Work  Clinical Social Work was referred by medical provider for assessment of psychosocial needs.  Clinical Social Worker contacted patient by phone to offer support and assess for needs.    CSw introduced self and support services. Patient shared about her cancer journey over the last 2 years with now 3 distinct diagnoses and the uncertainty and waiting with different steps. She has approached with a Production designer, theatre/television/film / get it done mentality but is starting to feel tired with the new diagnosis. There has also been disappointment in who has failed to show up for support, frustration, and a feeling of failure with the new diagnosis as she hoped to go into the new year cancer free.  Pt has support from her wife but does not want to worry her more than necessary.    CSW and pt scheduled for counseling session to provide space to process pt's experience and work towards improved coping.     Sonyia Muro E Christa Fasig, LCSW  Clinical Social Worker [facility]

## 2023-04-28 NOTE — Patient Instructions (Signed)
Preparing for your Surgery  Plan for surgery on May 28, 2023 with Dr. Eugene Garnet at [facility]. You will be scheduled for robotic assisted total laparoscopic hysterectomy (removal of the uterus and cervix), bilateral salpingo-oophorectomy (removal of both ovaries and fallopian tubes), sentinel lymph node biopsy, possible lymph node dissection, possible laparotomy (larger incision on your abdomen if needed).   Pre-operative Testing -You will receive a phone call from presurgical testing at [facility] to arrange for a pre-operative appointment and lab work.  -Bring your insurance card, copy of an advanced directive if applicable, medication list  -At that visit, you will be asked to sign a consent for a possible blood transfusion in case a transfusion becomes necessary during surgery.  The need for a blood transfusion is rare but having consent is a necessary part of your care.     -You should not be taking blood thinners or aspirin at least ten days prior to surgery unless instructed by your surgeon.  -Do not take supplements such as fish oil (omega 3), red yeast rice, turmeric before your surgery. STOP TAKING AT LEAST 10 DAYS BEFORE SURGERY. You want to avoid medications with aspirin in them including headache powders such as BC or Goody's), Excedrin migraine.  Day Before Surgery at Home -You will be asked to take in a light diet the day before surgery. You will be advised you can have clear liquids up until 3 hours before your surgery.    Eat a light diet the day before surgery.  Examples including soups, broths, toast, yogurt, mashed potatoes.  AVOID GAS PRODUCING FOODS AND BEVERAGES. Things to avoid include carbonated beverages (fizzy beverages, sodas), raw fruits and raw vegetables (uncooked), or beans.   If your bowels are filled with gas, your surgeon will have difficulty visualizing your pelvic organs which increases your surgical risks.  Your role in  recovery Your role is to become active as soon as directed by your doctor, while still giving yourself time to heal.  Rest when you feel tired. You will be asked to do the following in order to speed your recovery:  - Cough and breathe deeply. This helps to clear and expand your lungs and can prevent pneumonia after surgery.  - STAY ACTIVE WHEN YOU GET HOME. Do mild physical activity. Walking or moving your legs help your circulation and body functions return to normal. Do not try to get up or walk alone the first time after surgery.   -If you develop swelling on one leg or the other, pain in the back of your leg, redness/warmth in one of your legs, please call the office or go to the Emergency Room to have a doppler to rule out a blood clot. For shortness of breath, chest pain-seek care in the Emergency Room as soon as possible. - Actively manage your pain. Managing your pain lets you move in comfort. We will ask you to rate your pain on a scale of zero to 10. It is your responsibility to tell your doctor or nurse where and how much you hurt so your pain can be treated.  Special Considerations -If you are diabetic, you may be placed on insulin after surgery to have closer control over your blood sugars to promote healing and recovery.  This does not mean that you will be discharged on insulin.  If applicable, your oral antidiabetics will be resumed when you are tolerating a solid diet.  -Your final pathology results from surgery should be  available around one week after surgery and the results will be relayed to you when available.  -FMLA forms can be faxed to [phone] and please allow 5-7 business days for completion.  Pain Management After Surgery -You will be prescribed your pain medication and bowel regimen medications before surgery closer to the date so that you can have these available when you are discharged from the hospital. The pain medication is for use ONLY AFTER surgery and a new  prescription will not be given.   -Make sure that you have Tylenol and Ibuprofen IF YOU ARE ABLE TO TAKE THESE MEDICATIONS at home to use on a regular basis after surgery for pain control. We recommend alternating the medications every hour to six hours since they work differently and are processed in the body differently for pain relief.  -Review the attached handout on narcotic use and their risks and side effects.   Bowel Regimen -You will be prescribed Sennakot-S to take nightly to prevent constipation especially if you are taking the narcotic pain medication intermittently.  It is important to prevent constipation and drink adequate amounts of liquids. You can stop taking this medication when you are not taking pain medication and you are back on your normal bowel routine.  Risks of Surgery Risks of surgery are low but include bleeding, infection, damage to surrounding structures, re-operation, blood clots, and very rarely death.   Blood Transfusion Information (For the consent to be signed before surgery)  We will be checking your blood type before surgery so in case of emergencies, we will know what type of blood you would need.                                            WHAT IS A BLOOD TRANSFUSION?  A transfusion is the replacement of blood or some of its parts. Blood is made up of multiple cells which provide different functions. Red blood cells carry oxygen and are used for blood loss replacement. White blood cells fight against infection. Platelets control bleeding. Plasma helps clot blood. Other blood products are available for specialized needs, such as hemophilia or other clotting disorders. BEFORE THE TRANSFUSION  Who gives blood for transfusions?  You may be able to donate blood to be used at a later date on yourself (autologous donation). Relatives can be asked to donate blood. This is generally not any safer than if you have received blood from a stranger. The same  precautions are taken to ensure safety when a relative's blood is donated. Healthy volunteers who are fully evaluated to make sure their blood is safe. This is blood bank blood. Transfusion therapy is the safest it has ever been in the practice of medicine. Before blood is taken from a donor, a complete history is taken to make sure that person has no history of diseases nor engages in risky social behavior (examples are intravenous drug use or sexual activity with multiple partners). The donor's travel history is screened to minimize risk of transmitting infections, such as malaria. The donated blood is tested for signs of infectious diseases, such as HIV and hepatitis. The blood is then tested to be sure it is compatible with you in order to minimize the chance of a transfusion reaction. If you or a relative donates blood, this is often done in anticipation of surgery and is not appropriate for emergency situations.  It takes many days to process the donated blood. RISKS AND COMPLICATIONS Although transfusion therapy is very safe and saves many lives, the main dangers of transfusion include:  Getting an infectious disease. Developing a transfusion reaction. This is an allergic reaction to something in the blood you were given. Every precaution is taken to prevent this. The decision to have a blood transfusion has been considered carefully by your caregiver before blood is given. Blood is not given unless the benefits outweigh the risks.  AFTER SURGERY INSTRUCTIONS  Return to work: 4-6 weeks if applicable  Activity: 1. Be up and out of the bed during the day.  Take a nap if needed.  You may walk up steps but be careful and use the hand rail.  Stair climbing will tire you more than you think, you may need to stop part way and rest.   2. No lifting or straining for 6 weeks over 10 pounds. No pushing, pulling, straining for 6 weeks.  3. No driving for 4-01 days when the following criteria have been  met: Do not drive if you are taking narcotic pain medicine and make sure that your reaction time has returned.   4. You can shower as soon as the next day after surgery. Shower daily.  Use your regular soap and water (not directly on the incision) and pat your incision(s) dry afterwards; don't rub.  No tub baths or submerging your body in water until cleared by your surgeon. If you have the soap that was given to you by pre-surgical testing that was used before surgery, you do not need to use it afterwards because this can irritate your incisions.   5. No sexual activity and nothing in the vagina for 10-12 weeks.  6. You may experience a small amount of clear drainage from your incisions, which is normal.  If the drainage persists, increases, or changes color please call the office.  7. Do not use creams, lotions, or ointments such as neosporin on your incisions after surgery until advised by your surgeon because they can cause removal of the dermabond glue on your incisions.    8. You may experience vaginal spotting after surgery or when the stitches at the top of the vagina begin to dissolve.  The spotting is normal but if you experience heavy bleeding, call our office.  9. Take Tylenol or ibuprofen first for pain if you are able to take these medications and only use narcotic pain medication for severe pain not relieved by the Tylenol or Ibuprofen.  Monitor your Tylenol intake to a max of 4,000 mg in a 24 hour period. You can alternate these medications after surgery.  Diet: 1. Low sodium Heart Healthy Diet is recommended but you are cleared to resume your normal (before surgery) diet after your procedure.  2. It is safe to use a laxative, such as Miralax or Colace, if you have difficulty moving your bowels before surgery. You will be prescribed Sennakot-S to take at bedtime every evening after surgery to keep bowel movements regular and to prevent constipation.    Wound Care: 1. Keep clean  and dry.  Shower daily.  Reasons to call the Doctor: Fever - Oral temperature greater than 100.4 degrees Fahrenheit Foul-smelling vaginal discharge Difficulty urinating Nausea and vomiting Increased pain at the site of the incision that is unrelieved with pain medicine. Difficulty breathing with or without chest pain New calf pain especially if only on one side Sudden, continuing increased vaginal bleeding with  or without clots.   Contacts: For questions or concerns you should contact:  Dr. Eugene Garnet at [phone]  Warner Mccreedy, NP at [phone]  After Hours: call [phone] and have the GYN Oncologist paged/contacted (after 5 pm or on the weekends). You will speak with an after hours RN and let he or she know you have had surgery.  Messages sent via mychart are for non-urgent matters and are not responded to after hours so for urgent needs, please call the after hours number.

## 2023-04-30 ENCOUNTER — Other Ambulatory Visit: Payer: Self-pay

## 2023-05-01 ENCOUNTER — Encounter: Payer: Self-pay | Admitting: Gynecologic Oncology

## 2023-05-01 ENCOUNTER — Inpatient Hospital Stay: Payer: 59 | Admitting: Gynecologic Oncology

## 2023-05-01 DIAGNOSIS — N8502 Endometrial intraepithelial neoplasia [EIN]: Secondary | ICD-10-CM

## 2023-05-02 ENCOUNTER — Other Ambulatory Visit: Payer: Self-pay | Admitting: Gynecologic Oncology

## 2023-05-02 ENCOUNTER — Telehealth: Payer: Self-pay | Admitting: *Deleted

## 2023-05-02 ENCOUNTER — Telehealth: Payer: Self-pay | Admitting: Gynecologic Oncology

## 2023-05-02 DIAGNOSIS — N8502 Endometrial intraepithelial neoplasia [EIN]: Secondary | ICD-10-CM

## 2023-05-02 NOTE — Telephone Encounter (Signed)
Spoke with [name] who called the office stating she missed Dr. Winferd Humphrey call yesterday evening, but she will keep her phone close by today if Dr. Pricilla Holm would like to call her back.   Patient was advised that the office would be reaching out in the next week and a half to ask about her blood sugars. Pt states she hasn't been checking her blood sugars, pt instructed that she should be checking her blood sugars daily, preferably first thing in the morning (fasting) and two hours after eating and keep a log. Ideally blood sugars need to be less than 180 preoperatively. Pt verbalized understanding and said, "I can do that"  Message relayed to provider.

## 2023-05-02 NOTE — Telephone Encounter (Signed)
Spoke with the patient addressing some of the questions she sent in MyChart.  She is concerned about major surgery given her recent history and still feeling like she is recovering from her last surgery.  She also feels somewhat overwhelmed about her diabetes.   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  Discussed oral progesterone and Mirena IUD.  Discussed that if proceeding with Mirena IUD placement, I would recommend dilation and curettage under some anesthesia with IUD placement at that time.  This will give [place] more tissue to assess whether low-grade cancer is present.  Discussed follow-up plan with biopsies every 3 months. Plan to keep her surgery date as is but will change her procedure from hysterectomy to hysteroscopy with endometrial sampling, Mirena IUD placement.  Eugene Garnet MD Gynecologic Oncology

## 2023-05-05 ENCOUNTER — Other Ambulatory Visit: Payer: 59 | Admitting: Licensed Clinical Social Worker

## 2023-05-05 ENCOUNTER — Telehealth: Payer: Self-pay | Admitting: Licensed Clinical Social Worker

## 2023-05-05 NOTE — Telephone Encounter (Signed)
[facility] Clinical Social Work  CSW received VM from pt needing to cancel this morning's appt and that pt would not be available by phone until after 12pm.  CSW attempted to contact pt by phone to determine if she would like to reschedule. No answer, VM full- unable to leave message. Sent message by MyChart to pt.   Destin Vinsant E Kenia Teagarden, LCSW

## 2023-05-06 ENCOUNTER — Telehealth: Payer: Self-pay | Admitting: *Deleted

## 2023-05-06 NOTE — Telephone Encounter (Signed)
 Per Dr Pricilla Holm fax surgical optimization form to the patient's PCP office. Arlyss Repress Allwardt, NP  [phone])

## 2023-05-06 NOTE — Telephone Encounter (Signed)
Ready received

## 2023-05-09 ENCOUNTER — Telehealth: Payer: Self-pay | Admitting: Physician Assistant

## 2023-05-09 NOTE — Telephone Encounter (Signed)
 Received faxed document Surgical Clearance, to be filled out by provider. Patient requested to send it back via Fax Document is located in providers tray at front office.Please advise .

## 2023-05-12 ENCOUNTER — Telehealth: Payer: Self-pay | Admitting: *Deleted

## 2023-05-12 NOTE — Telephone Encounter (Signed)
 Attempted to reach patient to check in regards to her blood sugars. Left voicemail requesting call back.

## 2023-05-12 NOTE — Telephone Encounter (Signed)
 Previously faxed for patient on 05/05/23 and contacted [facility]ology/Oncology office and was advised the clearance forms have been received; no further action needed at this time.

## 2023-05-12 NOTE — Telephone Encounter (Signed)
-----   Message from Doylene Bode sent at 04/28/2023  3:46 PM EST ----- Dr. Pricilla Holm would like for her to have a check in in around 2 weeks to see how her blood sugars have been running. She is for surgery on 1/22. Thanks

## 2023-05-13 NOTE — Telephone Encounter (Signed)
2nd attempt to reach patient. Left voicemail requesting call back.

## 2023-05-15 NOTE — Telephone Encounter (Signed)
 Attempted to reach patient in regards to her blood sugars. Left a voicemail requesting a call back

## 2023-05-16 NOTE — Telephone Encounter (Signed)
-----   Message from Doylene Bode sent at 04/28/2023  3:46 PM EST ----- Dr. Pricilla Holm would like for her to have a check in in around 2 weeks to see how her blood sugars have been running. She is for surgery on 1/22. Thanks

## 2023-05-16 NOTE — Telephone Encounter (Signed)
 4 th attempt to reach patient in regards to her blood sugars. Left voicemail requesting call back.

## 2023-05-19 ENCOUNTER — Encounter (HOSPITAL_COMMUNITY): Payer: 59

## 2023-05-19 ENCOUNTER — Telehealth: Payer: Self-pay | Admitting: *Deleted

## 2023-05-19 NOTE — Telephone Encounter (Signed)
-----   Message from Doylene Bode sent at 04/28/2023  3:46 PM EST ----- Dr. Pricilla Holm would like for her to have a check in in around 2 weeks to see how her blood sugars have been running. She is for surgery on 1/22. Thanks

## 2023-05-19 NOTE — Telephone Encounter (Signed)
 5 th  attempt to reach patient in regards to her blood sugar monitoring. Left voicemail requesting call back.

## 2023-05-19 NOTE — Telephone Encounter (Signed)
 Opened in error

## 2023-05-19 NOTE — Telephone Encounter (Signed)
 Spoke with patient after multiple attempts to reach out. Pt states her fasting blood sugar on average in the morning has been 169. Pt states she has had some that have been 149,141, 127, 258 depending on what she has had to eat the evening before. Pt is trying not to eat after  7 pm. Pt states her blood sugars during the day after meals have been 120's-140's.  Pt has been trying to make some dietary changes. Advised patient to work with her PCP and ask about a dietician that can work with her in regards to carbohydrate counting and tracking. Pt verbalized understanding and thanked the office for calling.

## 2023-05-20 ENCOUNTER — Encounter: Payer: Self-pay | Admitting: Internal Medicine

## 2023-05-22 ENCOUNTER — Other Ambulatory Visit: Payer: Self-pay

## 2023-05-22 ENCOUNTER — Other Ambulatory Visit: Payer: Self-pay | Admitting: Thoracic Surgery (Cardiothoracic Vascular Surgery)

## 2023-05-22 DIAGNOSIS — Z902 Acquired absence of lung [part of]: Secondary | ICD-10-CM

## 2023-05-22 NOTE — Patient Instructions (Signed)
DUE TO COVID-19 ONLY TWO VISITORS  (aged 54 and older)  ARE ALLOWED TO COME WITH YOU AND STAY IN THE WAITING ROOM ONLY DURING PRE OP AND PROCEDURE.   **NO VISITORS ARE ALLOWED IN THE SHORT STAY AREA OR RECOVERY ROOM!!**  IF YOU WILL BE ADMITTED INTO THE HOSPITAL YOU ARE ALLOWED ONLY FOUR SUPPORT PEOPLE DURING VISITATION HOURS ONLY (7 AM -8PM)   The support person(s) must pass our screening, gel in and out, and wear a mask at all times, including in the patient's room. Patients must also wear a mask when staff or their support person are in the room. Visitors GUEST BADGE MUST BE WORN VISIBLY  One adult visitor may remain with you overnight and MUST be in the room by 8 P.M.     Your procedure is scheduled on: 05/28/23   Report to [facility] Main Entrance    Report to admitting at : 5:15 AM   Call this number if you have problems the morning of surgery [phone]   Do not eat food : After Midnight.   After Midnight you may have the following liquids until: 4:30 AM DAY OF SURGERY  Water Black Coffee (sugar ok, NO MILK/CREAM OR CREAMERS)  Tea (sugar ok, NO MILK/CREAM OR CREAMERS) regular and decaf                             Plain Jell-O (NO RED)                                           Fruit ices (not with fruit pulp, NO RED)                                     Popsicles (NO RED)                                                                  Juice: apple, WHITE grape, WHITE cranberry Sports drinks like Gatorade (NO RED)              FOLLOW ANY ADDITIONAL PRE OP INSTRUCTIONS YOU RECEIVED FROM YOUR SURGEON'S OFFICE!!!   Oral Hygiene is also important to reduce your risk of infection.                                    Remember - BRUSH YOUR TEETH THE MORNING OF SURGERY WITH YOUR REGULAR TOOTHPASTE  DENTURES WILL BE REMOVED PRIOR TO SURGERY PLEASE DO NOT APPLY "Poly grip" OR ADHESIVES!!!   Do NOT smoke after Midnight   Take these medicines the morning of surgery with A  SIP OF WATER: allopurinol.Colchicine as needed.  DO NOT TAKE ANY ORAL DIABETIC MEDICATIONS DAY OF YOUR SURGERY                             You may not have any metal on your body including hair pins, jewelry,  and body piercing             Do not wear make-up, lotions, powders, perfumes/cologne, or deodorant  Do not wear nail polish including gel and S&S, artificial/acrylic nails, or any other type of covering on natural nails including finger and toenails. If you have artificial nails, gel coating, etc. that needs to be removed by a nail salon please have this removed prior to surgery or surgery may need to be canceled/ delayed if the surgeon/ anesthesia feels like they are unable to be safely monitored.   Do not shave  48 hours prior to surgery.    Do not bring valuables to the hospital. [facility] IS NOT             RESPONSIBLE   FOR VALUABLES.   Contacts, glasses, or bridgework may not be worn into surgery.   Bring small overnight bag day of surgery.   DO NOT BRING YOUR HOME MEDICATIONS TO THE HOSPITAL. PHARMACY WILL DISPENSE MEDICATIONS LISTED ON YOUR MEDICATION LIST TO YOU DURING YOUR ADMISSION IN THE HOSPITAL!    Patients discharged on the day of surgery will not be allowed to drive home.  Someone NEEDS to stay with you for the first 24 hours after anesthesia.   Special Instructions: Bring a copy of your healthcare power of attorney and living will documents         the day of surgery if you haven't scanned them before.              Please read over the following fact sheets you were given: IF YOU HAVE QUESTIONS ABOUT YOUR PRE-OP INSTRUCTIONS PLEASE CALL [phone]    [facility] - Preparing for Surgery Before surgery, you can play an important role.  Because skin is not sterile, your skin needs to be as free of germs as possible.  You can reduce the number of germs on your skin by washing with CHG (chlorahexidine gluconate) soap before surgery.  CHG is an antiseptic cleaner  which kills germs and bonds with the skin to continue killing germs even after washing. Please DO NOT use if you have an allergy to CHG or antibacterial soaps.  If your skin becomes reddened/irritated stop using the CHG and inform your nurse when you arrive at Short Stay. Do not shave (including legs and underarms) for at least 48 hours prior to the first CHG shower.  You may shave your face/neck. Please follow these instructions carefully:  1.  Shower with CHG Soap the night before surgery and the  morning of Surgery.  2.  If you choose to wash your hair, wash your hair first as usual with your  normal  shampoo.  3.  After you shampoo, rinse your hair and body thoroughly to remove the  shampoo.                           4.  Use CHG as you would any other liquid soap.  You can apply chg directly  to the skin and wash                       Gently with a scrungie or clean washcloth.  5.  Apply the CHG Soap to your body ONLY FROM THE NECK DOWN.   Do not use on face/ open  Wound or open sores. Avoid contact with eyes, ears mouth and genitals (private parts).                       Wash face,  Genitals (private parts) with your normal soap.             6.  Wash thoroughly, paying special attention to the area where your surgery  will be performed.  7.  Thoroughly rinse your body with warm water from the neck down.  8.  DO NOT shower/wash with your normal soap after using and rinsing off  the CHG Soap.                9.  Pat yourself dry with a clean towel.            10.  Wear clean pajamas.            11.  Place clean sheets on your bed the night of your first shower and do not  sleep with pets. Day of Surgery : Do not apply any lotions/deodorants the morning of surgery.  Please wear clean clothes to the hospital/surgery center.  FAILURE TO FOLLOW THESE INSTRUCTIONS MAY RESULT IN THE CANCELLATION OF YOUR SURGERY PATIENT SIGNATURE_________________________________  NURSE  SIGNATURE__________________________________  ________________________________________________________________________

## 2023-05-23 ENCOUNTER — Encounter: Payer: 59 | Admitting: Family Medicine

## 2023-05-26 ENCOUNTER — Encounter (HOSPITAL_COMMUNITY): Payer: Self-pay

## 2023-05-26 ENCOUNTER — Encounter (HOSPITAL_COMMUNITY)
Admission: RE | Admit: 2023-05-26 | Discharge: 2023-05-26 | Disposition: A | Discharge status: Home / Self Care | Payer: 59 | Source: Ambulatory Visit | Attending: Gynecologic Oncology | Admitting: Gynecologic Oncology

## 2023-05-26 ENCOUNTER — Other Ambulatory Visit: Payer: Self-pay

## 2023-05-26 VITALS — BP 143/86 | HR 68 | Temp 98.9°F | Ht 69.0 in | Wt 252.0 lb

## 2023-05-26 DIAGNOSIS — E1165 Type 2 diabetes mellitus with hyperglycemia: Secondary | ICD-10-CM | POA: Insufficient documentation

## 2023-05-26 DIAGNOSIS — Z01812 Encounter for preprocedural laboratory examination: Secondary | ICD-10-CM | POA: Diagnosis present

## 2023-05-26 HISTORY — DX: Anxiety disorder, unspecified: F41.9

## 2023-05-26 LAB — CBC
HCT: 40 % (ref 36.0–46.0)
Hemoglobin: 13 g/dL (ref 12.0–15.0)
MCH: 28.3 pg (ref 26.0–34.0)
MCHC: 32.5 g/dL (ref 30.0–36.0)
MCV: 87 fL (ref 80.0–100.0)
Platelets: 261 10*3/uL (ref 150–400)
RBC: 4.6 MIL/uL (ref 3.87–5.11)
RDW: 12.9 % (ref 11.5–15.5)
WBC: 7.7 10*3/uL (ref 4.0–10.5)
nRBC: 0 % (ref 0.0–0.2)

## 2023-05-26 LAB — BASIC METABOLIC PANEL
Anion gap: 9 (ref 5–15)
BUN: 9 mg/dL (ref 6–20)
CO2: 24 mmol/L (ref 22–32)
Calcium: 9.4 mg/dL (ref 8.9–10.3)
Chloride: 106 mmol/L (ref 98–111)
Creatinine, Ser: 0.62 mg/dL (ref 0.44–1.00)
GFR, Estimated: >60 mL/min (ref >60)
Glucose, Bld: 139 mg/dL — ABNORMAL HIGH (ref 70–99)
Potassium: 4 mmol/L (ref 3.5–5.1)
Sodium: 139 mmol/L (ref 135–145)

## 2023-05-26 LAB — GLUCOSE, CAPILLARY: Glucose-Capillary: 156 mg/dL — ABNORMAL HIGH (ref 70–99)

## 2023-05-26 NOTE — Progress Notes (Signed)
For Anesthesia: PCP - Allwardt, Crist Infante, PA-C  Cardiologist - N/A  Bowel Prep reminder:  Chest x-ray - 03/18/23 EKG - 01/22/23 Stress Test -  ECHO - 07/17/22 Cardiac Cath -  Pacemaker/ICD device last checked: Pacemaker orders received: Device Rep notified:  Spinal Cord Stimulator:  Sleep Study - N/A CPAP -   Fasting Blood Sugar - 140's - 160's Checks Blood Sugar __3___ times a day Date and result of last Hgb A1c-8.1: 03/19/23  Last dose of GLP1 agonist- N/A GLP1 instructions:   Last dose of SGLT-2 inhibitors- N/A SGLT-2 instructions:   Blood Thinner Instructions:N/A Aspirin Instructions: Last Dose:  Activity level: Can go up a flight of stairs and activities of daily living without stopping and without chest pain and/or shortness of breath   Able to exercise without chest pain and/or shortness of breath  Anesthesia review:   Patient denies shortness of breath, fever, cough and chest pain at PAT appointment   Patient verbalized understanding of instructions that were given to them at the PAT appointment. Patient was also instructed that they will need to review over the PAT instructions again at home before surgery.

## 2023-05-27 ENCOUNTER — Telehealth: Payer: Self-pay | Admitting: *Deleted

## 2023-05-27 ENCOUNTER — Ambulatory Visit: Payer: 59 | Admitting: Thoracic Surgery (Cardiothoracic Vascular Surgery)

## 2023-05-27 NOTE — Telephone Encounter (Signed)
Telephone call to check on pre-operative status.  Patient compliant with pre-operative instructions.  Reinforced nothing to eat after midnight. Clear liquids until 0416. Patient to arrive at 0516.  No questions or concerns voiced.  Instructed to call for any needs.

## 2023-05-27 NOTE — Telephone Encounter (Signed)
Attempted to reach patient for pre-op call. LVM requesting call back.

## 2023-05-28 ENCOUNTER — Ambulatory Visit (HOSPITAL_COMMUNITY): Payer: Self-pay | Admitting: Anesthesiology

## 2023-05-28 ENCOUNTER — Encounter (HOSPITAL_COMMUNITY): Payer: Self-pay | Admitting: Gynecologic Oncology

## 2023-05-28 ENCOUNTER — Encounter (HOSPITAL_COMMUNITY)
Admission: RE | Disposition: A | Discharge status: Home / Self Care | Payer: Self-pay | Source: Ambulatory Visit | Attending: Gynecologic Oncology

## 2023-05-28 ENCOUNTER — Other Ambulatory Visit: Payer: Self-pay

## 2023-05-28 ENCOUNTER — Ambulatory Visit (HOSPITAL_COMMUNITY)
Admission: RE | Admit: 2023-05-28 | Discharge: 2023-05-28 | Disposition: A | Discharge status: Home / Self Care | Payer: 59 | Source: Ambulatory Visit | Attending: Gynecologic Oncology | Admitting: Gynecologic Oncology

## 2023-05-28 ENCOUNTER — Ambulatory Visit (HOSPITAL_BASED_OUTPATIENT_CLINIC_OR_DEPARTMENT_OTHER): Payer: Self-pay | Admitting: Anesthesiology

## 2023-05-28 DIAGNOSIS — Z6837 Body mass index (BMI) 37.0-37.9, adult: Secondary | ICD-10-CM | POA: Diagnosis not present

## 2023-05-28 DIAGNOSIS — M1990 Unspecified osteoarthritis, unspecified site: Secondary | ICD-10-CM | POA: Insufficient documentation

## 2023-05-28 DIAGNOSIS — Z30430 Encounter for insertion of intrauterine contraceptive device: Secondary | ICD-10-CM | POA: Insufficient documentation

## 2023-05-28 DIAGNOSIS — Z780 Asymptomatic menopausal state: Secondary | ICD-10-CM | POA: Insufficient documentation

## 2023-05-28 DIAGNOSIS — N8502 Endometrial intraepithelial neoplasia [EIN]: Secondary | ICD-10-CM

## 2023-05-28 DIAGNOSIS — E669 Obesity, unspecified: Secondary | ICD-10-CM | POA: Insufficient documentation

## 2023-05-28 DIAGNOSIS — E1165 Type 2 diabetes mellitus with hyperglycemia: Secondary | ICD-10-CM | POA: Diagnosis not present

## 2023-05-28 DIAGNOSIS — R9389 Abnormal findings on diagnostic imaging of other specified body structures: Secondary | ICD-10-CM

## 2023-05-28 DIAGNOSIS — Z853 Personal history of malignant neoplasm of breast: Secondary | ICD-10-CM | POA: Insufficient documentation

## 2023-05-28 DIAGNOSIS — Z9011 Acquired absence of right breast and nipple: Secondary | ICD-10-CM | POA: Diagnosis not present

## 2023-05-28 DIAGNOSIS — Z7984 Long term (current) use of oral hypoglycemic drugs: Secondary | ICD-10-CM | POA: Insufficient documentation

## 2023-05-28 DIAGNOSIS — C541 Malignant neoplasm of endometrium: Secondary | ICD-10-CM | POA: Diagnosis not present

## 2023-05-28 HISTORY — PX: HYSTEROSCOPY WITH D & C: SHX1775

## 2023-05-28 HISTORY — PX: INTRAUTERINE DEVICE (IUD) INSERTION: SHX5877

## 2023-05-28 LAB — GLUCOSE, CAPILLARY
Glucose-Capillary: 137 mg/dL — ABNORMAL HIGH (ref 70–99)
Glucose-Capillary: 129 mg/dL — ABNORMAL HIGH (ref 70–99)

## 2023-05-28 LAB — POCT PREGNANCY, URINE: Preg Test, Ur: NEGATIVE

## 2023-05-28 SURGERY — DILATATION AND CURETTAGE /HYSTEROSCOPY
Anesthesia: General | Site: Vagina

## 2023-05-28 MED ORDER — LIDOCAINE HCL 1 % IJ SOLN
INTRAMUSCULAR | Status: DC | PRN
  Administered 2023-05-28: 10 mL

## 2023-05-28 MED ORDER — ACETAMINOPHEN 500 MG PO TABS
1000.0000 mg | ORAL_TABLET | ORAL | Status: AC
  Administered 2023-05-28: 1000 mg via ORAL
  Filled 2023-05-28: qty 2

## 2023-05-28 MED ORDER — DEXAMETHASONE SODIUM PHOSPHATE 10 MG/ML IJ SOLN
INTRAMUSCULAR | Status: AC
  Filled 2023-05-28: qty 1

## 2023-05-28 MED ORDER — BUPIVACAINE HCL 0.25 % IJ SOLN
INTRAMUSCULAR | Status: AC
  Filled 2023-05-28: qty 1

## 2023-05-28 MED ORDER — LIDOCAINE HCL (CARDIAC) PF 100 MG/5ML IV SOSY
PREFILLED_SYRINGE | INTRAVENOUS | Status: DC | PRN
  Administered 2023-05-28: 100 mg via INTRAVENOUS

## 2023-05-28 MED ORDER — ORAL CARE MOUTH RINSE: 15.0000 mL | Freq: Once | OROMUCOSAL | Status: AC

## 2023-05-28 MED ORDER — PROPOFOL 10 MG/ML IV BOLUS
INTRAVENOUS | Status: DC | PRN
  Administered 2023-05-28: 200 mg via INTRAVENOUS

## 2023-05-28 MED ORDER — ONDANSETRON HCL 4 MG/2ML IJ SOLN: 4.0000 mg | Freq: Once | INTRAMUSCULAR | Status: DC | PRN

## 2023-05-28 MED ORDER — PROPOFOL 500 MG/50ML IV EMUL
INTRAVENOUS | Status: AC
  Filled 2023-05-28: qty 50

## 2023-05-28 MED ORDER — MIDAZOLAM HCL 2 MG/2ML IJ SOLN
INTRAMUSCULAR | Status: AC
  Filled 2023-05-28: qty 2

## 2023-05-28 MED ORDER — CHLORHEXIDINE GLUCONATE 0.12 % MT SOLN
15.0000 mL | Freq: Once | OROMUCOSAL | Status: AC
  Administered 2023-05-28: 15 mL via OROMUCOSAL

## 2023-05-28 MED ORDER — FENTANYL CITRATE PF 50 MCG/ML IJ SOSY
25.0000 ug | PREFILLED_SYRINGE | INTRAMUSCULAR | Status: DC | PRN
  Administered 2023-05-28: 50 ug via INTRAVENOUS

## 2023-05-28 MED ORDER — SCOPOLAMINE 1 MG/3DAYS TD PT72
1.0000 | MEDICATED_PATCH | TRANSDERMAL | Status: DC
  Administered 2023-05-28: 1.5 mg via TRANSDERMAL
  Filled 2023-05-28: qty 1

## 2023-05-28 MED ORDER — KETOROLAC TROMETHAMINE 30 MG/ML IJ SOLN
INTRAMUSCULAR | Status: DC | PRN
  Administered 2023-05-28: 30 mg via INTRAVENOUS

## 2023-05-28 MED ORDER — FENTANYL CITRATE (PF) 100 MCG/2ML IJ SOLN
INTRAMUSCULAR | Status: AC
  Filled 2023-05-28: qty 2

## 2023-05-28 MED ORDER — FENTANYL CITRATE (PF) 100 MCG/2ML IJ SOLN
INTRAMUSCULAR | Status: DC | PRN
  Administered 2023-05-28: 25 ug via INTRAVENOUS
  Administered 2023-05-28: 50 ug via INTRAVENOUS

## 2023-05-28 MED ORDER — LIDOCAINE HCL (PF) 1 % IJ SOLN
INTRAMUSCULAR | Status: AC
  Filled 2023-05-28: qty 30

## 2023-05-28 MED ORDER — ONDANSETRON HCL 4 MG/2ML IJ SOLN
INTRAMUSCULAR | Status: AC
Start: 2023-05-28 — End: ?
  Filled 2023-05-28: qty 2

## 2023-05-28 MED ORDER — FENTANYL CITRATE PF 50 MCG/ML IJ SOSY
PREFILLED_SYRINGE | INTRAMUSCULAR | Status: AC
  Filled 2023-05-28: qty 1

## 2023-05-28 MED ORDER — INSULIN ASPART 100 UNIT/ML IJ SOLN
0.0000 [IU] | INTRAMUSCULAR | Status: DC | PRN
Start: 2023-05-28 — End: 2023-05-28

## 2023-05-28 MED ORDER — ONDANSETRON HCL 4 MG/2ML IJ SOLN
INTRAMUSCULAR | Status: DC | PRN
  Administered 2023-05-28: 4 mg via INTRAVENOUS

## 2023-05-28 MED ORDER — LEVONORGESTREL 20 MCG/DAY IU IUD
1.0000 | INTRAUTERINE_SYSTEM | INTRAUTERINE | Status: AC
  Administered 2023-05-28: 1 via INTRAUTERINE
  Filled 2023-05-28: qty 1

## 2023-05-28 MED ORDER — SODIUM CHLORIDE 0.9 % IR SOLN
Status: DC | PRN
  Administered 2023-05-28: 3000 mL

## 2023-05-28 MED ORDER — AMISULPRIDE (ANTIEMETIC) 5 MG/2ML IV SOLN: 10.0000 mg | Freq: Once | INTRAVENOUS | Status: DC | PRN

## 2023-05-28 MED ORDER — DEXAMETHASONE SODIUM PHOSPHATE 10 MG/ML IJ SOLN
INTRAMUSCULAR | Status: DC | PRN
  Administered 2023-05-28: 8 mg via INTRAVENOUS

## 2023-05-28 MED ORDER — KETOROLAC TROMETHAMINE 30 MG/ML IJ SOLN
INTRAMUSCULAR | Status: AC
  Filled 2023-05-28: qty 1

## 2023-05-28 MED ORDER — LACTATED RINGERS IV SOLN: INTRAVENOUS | Status: DC | PRN

## 2023-05-28 MED ORDER — DEXAMETHASONE SODIUM PHOSPHATE 4 MG/ML IJ SOLN: 4.0000 mg | INTRAMUSCULAR | Status: DC

## 2023-05-28 MED ORDER — MIDAZOLAM HCL 5 MG/5ML IJ SOLN
INTRAMUSCULAR | Status: DC | PRN
  Administered 2023-05-28: 2 mg via INTRAVENOUS

## 2023-05-28 MED ORDER — LACTATED RINGERS IV SOLN: INTRAVENOUS | Status: DC

## 2023-05-28 MED ORDER — LIDOCAINE HCL (PF) 2 % IJ SOLN
INTRAMUSCULAR | Status: AC
  Filled 2023-05-28: qty 10

## 2023-05-28 SURGICAL SUPPLY — 20 items
BAG COUNTER SPONGE SURGICOUNT (BAG) ×2 IMPLANT
DEVICE MYOSURE LITE (MISCELLANEOUS) IMPLANT
DEVICE MYOSURE REACH (MISCELLANEOUS) IMPLANT
DILATOR CANAL MILEX (MISCELLANEOUS) IMPLANT
GAUZE 4X4 16PLY ~~LOC~~+RFID DBL (SPONGE) IMPLANT
GLOVE BIO SURGEON STRL SZ 6 (GLOVE) ×2 IMPLANT
GLOVE BIO SURGEON STRL SZ 6.5 (GLOVE) IMPLANT
GOWN STRL REUS W/ TWL LRG LVL3 (GOWN DISPOSABLE) ×2 IMPLANT
IV NS IRRIG 3000ML ARTHROMATIC (IV SOLUTION) ×2 IMPLANT
KIT PROCEDURE FLUENT (KITS) IMPLANT
KIT TURNOVER KIT A (KITS) IMPLANT
LOOP CUTTING BIPOLAR 21FR (ELECTRODE) IMPLANT
MYOSURE XL FIBROID (MISCELLANEOUS) IMPLANT
Mirena IMPLANT
PACK VAGINAL WOMENS (CUSTOM PROCEDURE TRAY) ×2 IMPLANT
PAD OB MATERNITY 11 LF (PERSONAL CARE ITEMS) IMPLANT
SEAL ROD LENS SCOPE MYOSURE (ABLATOR) IMPLANT
SYSTEM TISS REMOVAL MYOSURE XL (MISCELLANEOUS) IMPLANT
TOWEL OR 17X26 10 PK STRL BLUE (TOWEL DISPOSABLE) ×2 IMPLANT
WATER STERILE IRR 500ML POUR (IV SOLUTION) ×2 IMPLANT

## 2023-05-28 NOTE — Anesthesia Postprocedure Evaluation (Signed)
Anesthesia Post Note  Patient: Alexandera A Miracle  Procedure(s) Performed: DILATATION AND CURETTAGE / POSSIBLE HYSTEROSCOPY (Vagina ) INTRAUTERINE DEVICE (IUD) INSERTION MIRENA (Cervix)     Patient location during evaluation: PACU Anesthesia Type: General Level of consciousness: awake and alert Pain management: pain level controlled Vital Signs Assessment: post-procedure vital signs reviewed and stable Respiratory status: spontaneous breathing, nonlabored ventilation and respiratory function stable Cardiovascular status: blood pressure returned to baseline and stable Postop Assessment: no apparent nausea or vomiting Anesthetic complications: no   No notable events documented.  Last Vitals:  Vitals:   05/28/23 0815 05/28/23 0830  BP: 131/74 128/83  Pulse: 68 62  Resp: 12 20  Temp: 36.5 C   SpO2: 100% 96%    Last Pain:  Vitals:   05/28/23 0830  TempSrc:   PainSc: 5                  Collene Schlichter

## 2023-05-28 NOTE — Anesthesia Procedure Notes (Signed)
Procedure Name: LMA Insertion Date/Time: 05/28/2023 7:35 AM  Performed by: Floydene Flock, CRNAPre-anesthesia Checklist: Patient identified, Emergency Drugs available, Suction available, Patient being monitored and Timeout performed Patient Re-evaluated:Patient Re-evaluated prior to induction Oxygen Delivery Method: Circle system utilized Preoxygenation: Pre-oxygenation with 100% oxygen LMA: LMA inserted LMA Size: 4.0 Number of attempts: 1 Placement Confirmation: positive ETCO2 and breath sounds checked- equal and bilateral Tube secured with: Tape

## 2023-05-28 NOTE — Anesthesia Preprocedure Evaluation (Addendum)
Anesthesia Evaluation  Patient identified by MRN, date of birth, ID band Patient awake    Reviewed: Allergy & Precautions, NPO status , Patient's Chart, lab work & pertinent test results  Airway Mallampati: II  TM Distance: >3 FB Neck ROM: Full    Dental  (+) Teeth Intact, Dental Advisory Given   Pulmonary neg pulmonary ROS   Pulmonary exam normal breath sounds clear to auscultation       Cardiovascular Exercise Tolerance: Good negative cardio ROS Normal cardiovascular exam Rhythm:Regular Rate:Normal     Neuro/Psych  PSYCHIATRIC DISORDERS Anxiety     negative neurological ROS     GI/Hepatic negative GI ROS, Neg liver ROS,,,  Endo/Other  diabetes, Type 2, Oral Hypoglycemic Agents  Obesity   Renal/GU negative Renal ROS     Musculoskeletal  (+) Arthritis ,    Abdominal   Peds  Hematology negative hematology ROS (+)   Anesthesia Other Findings Day of surgery medications reviewed with the patient.  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                             Anesthesia Physical Anesthesia Plan  ASA: 2  Anesthesia Plan: General   Post-op Pain Management: Tylenol PO (pre-op)* and Toradol IV (intra-op)*   Induction: Intravenous  PONV Risk Score and Plan: 4 or greater and Scopolamine patch - Pre-op, Midazolam, Dexamethasone and Ondansetron  Airway Management Planned: LMA  Additional Equipment:   Intra-op Plan:   Post-operative Plan: Extubation in OR  Informed Consent: I have reviewed the patients History and Physical, chart, labs and discussed the procedure including the risks, benefits and alternatives for the proposed anesthesia with the patient or authorized representative who has indicated his/her understanding and acceptance.     Dental advisory given  Plan Discussed with: CRNA  Anesthesia Plan Comments:         Anesthesia Quick  Evaluation

## 2023-05-28 NOTE — Op Note (Addendum)
 OPERATIVE NOTE  PATIENT: Sylvania Evens DATE: 05/28/23  Preop Diagnosis: EIN  Postoperative Diagnosis: same as above  Surgery: Hysteroscopy, D&C (dilation and curettage), Mirena  IUD placement  Surgeons:  Wiley Hanger, MD  Assistant: none  Anesthesia: General   Estimated blood loss: 10 ml  IVF:  see I&O flowsheet   Urine output: n/a  Fluid deficit: 100 cc   Complications: None   Pathology: endometrial curetteings  Operative findings: Multiple polypoid lesions filling the endometrial cavity, largest measuring 1.5 cm. Endometrium mildly thickened in appearance. Mirena  Lot #[id number], exp 06/2025.  Procedure: The patient was identified in the preoperative holding area. Informed consent was signed on the chart. Patient was seen history was reviewed and exam was performed.   The patient was then taken to the operating room and placed in the supine position with SCD hose on. General anesthesia was then induced without difficulty. She was then placed in the dorsolithotomy position. The perineum was prepped with Betadine. The vagina was prepped with Betadine. The patient was then draped after the prep was dried.   Timeout was performed the patient, procedure, antibiotic, allergy, and length of procedure.   The speculum was placed in the vagina. The single tooth tenaculum was placed on the anterior lip of the cervix. 10 cc of a para-cervical block was injected. The uterine sound was placed in the cervix and advanced to the fundus. The cervix was successively dilated using pratts dilators to 18F.  The Myosure hysteroscope was inserted with findings noted above. Myosure Reach was used to resect nearly all polypoid tissue from the cavity. At the completion, fundus and bilateral ostia noted.  Curettings were collect in the specimen sock.   Hysteroscopy was removed. Mirena  IUD was inserted to the fundus, arms deployed, and inserter removed. Strings were cut at 4 cm from the  external os.   The tenaculum was removed and hemostasis was observed.   The vagina was irrigated.  All instrument, suture, laparotomy, Ray-Tec, and needle counts were correct x2. The patient tolerated the procedure well and was taken recovery room in stable condition.   Suzi Essex, MD

## 2023-05-28 NOTE — Interval H&P Note (Signed)
History and Physical Interval Note:  05/28/2023 6:45 AM  Allison Harper  has presented today for surgery, with the diagnosis of Endometrial intraepithelial neoplasia.  The various methods of treatment have been discussed with the patient and family. After consideration of risks, benefits and other options for treatment, the patient has consented to  Procedure(s): DILATATION AND CURETTAGE / POSSIBLE HYSTEROSCOPY (N/A) INTRAUTERINE DEVICE (IUD) INSERTION MIRENA (N/A) as a surgical intervention.  The patient's history has been reviewed, patient examined, no change in status, stable for surgery.  I have reviewed the patient's chart and labs.  Questions were answered to the patient's satisfaction.     Carver Fila

## 2023-05-28 NOTE — Transfer of Care (Signed)
Immediate Anesthesia Transfer of Care Note  Patient: Allison Harper  Procedure(s) Performed: DILATATION AND CURETTAGE / POSSIBLE HYSTEROSCOPY (Vagina ) INTRAUTERINE DEVICE (IUD) INSERTION MIRENA (Cervix)  Patient Location: PACU  Anesthesia Type:General  Level of Consciousness: awake, alert , and oriented  Airway & Oxygen Therapy: Patient Spontanous Breathing and Patient connected to face mask oxygen  Post-op Assessment: Report given to RN and Post -op Vital signs reviewed and stable  Post vital signs: Reviewed and stable  Last Vitals:  Vitals Value Taken Time  BP 112/68   Temp    Pulse 71   Resp 16   SpO2 100     Last Pain:  Vitals:   05/28/23 0637  TempSrc: Oral  PainSc:          Complications: No notable events documented.

## 2023-05-28 NOTE — Discharge Instructions (Addendum)
05/28/2023  Return to work: 1-3 days  Activity: 1. Be up and out of the bed during the day.  Take a nap if needed.    2. No lifting or straining for 2 weeks.  3. Do Not drive if you are taking narcotic pain medicine.  4. Shower daily.  Use soap and water on your incision and pat dry; don't rub.   5. No sexual activity and nothing in the vagina for 2 weeks.  Medications:  - Take ibuprofen and tylenol first line for pain control. Take these regularly (every 6 hours) to decrease the build up of pain.  - If necessary, for severe pain not relieved by ibuprofen, take percocet.  - While taking percocet you should take sennakot every night to reduce the likelihood of constipation. If this causes diarrhea, stop its use.  Diet: 1. Low sodium Heart Healthy Diet is recommended.  2. It is safe to use a laxative if you have difficulty moving your bowels.   Wound Care: 1. Keep clean and dry.  Shower daily.  Reasons to call the Doctor:  Fever - Oral temperature greater than 100.4 degrees Fahrenheit Foul-smelling vaginal discharge Difficulty urinating Nausea and vomiting Difficulty breathing with or without chest pain New calf pain especially if only on one side Sudden, continuing increased vaginal bleeding with or without clots.   Follow-up: 1. Dr. Pricilla Holm will contact with your pathology.  Contacts: For questions or concerns you should contact:  Dr. Eugene Garnet at [phone] After hours and on week-ends call [phone] and ask to speak to the physician on call for Gynecologic Oncology

## 2023-05-29 ENCOUNTER — Telehealth: Payer: Self-pay | Admitting: *Deleted

## 2023-05-29 ENCOUNTER — Encounter (HOSPITAL_COMMUNITY): Payer: Self-pay | Admitting: Gynecologic Oncology

## 2023-05-29 ENCOUNTER — Telehealth: Payer: Self-pay | Admitting: Gynecologic Oncology

## 2023-05-29 DIAGNOSIS — C541 Malignant neoplasm of endometrium: Secondary | ICD-10-CM

## 2023-05-29 NOTE — Telephone Encounter (Signed)
Spoke with Allison Harper this morning. She states she is eating, drinking and urinating well. She has not had a BM yet but is passing gas. Encouraged her to drink plenty of water. She denies fever or chills. Incisions are dry and intact. She rates her pain 2-3/10. Her pain is controlled with tylenol and ibuprofen.    Instructed to call office with any fever, chills, purulent drainage, uncontrolled pain or any other questions or concerns. Patient verbalizes understanding.   Pt aware of post op appointments as well as the office number [phone] and after hours number [phone] to call if she has any questions or concerns

## 2023-05-29 NOTE — Telephone Encounter (Signed)
Called the patient.  Discussed D&C results which show low-grade endometrial cancer.  Discussed with her that this is not unexpected given her EIN on initial biopsy.  Her preference still is to avoid major surgery at this time if possible.  I recommend that we get an MRI of the pelvis to assess myometrial invasion as well as metastatic disease.  She is amenable to doing this.  Order placed today and message sent to my staff to get this scheduled.  Otherwise, I will see her for follow-up in 3 months for repeat biopsy.  Eugene Garnet MD Gynecologic Oncology

## 2023-05-30 ENCOUNTER — Telehealth: Payer: Self-pay

## 2023-05-30 LAB — SURGICAL PATHOLOGY

## 2023-05-30 NOTE — Telephone Encounter (Signed)
LVM for patient to call office regarding the MRI scheduled for tomorrow 1/25 @ 1:30 at [facility], with arrival time of 1:00. Also, per Dr.Tucker, a 3 month follow up with biopsy is scheduled for 4/24 @ 2:30.

## 2023-05-30 NOTE — Telephone Encounter (Signed)
-----   Message from Nurse Cheron Schaumann W sent at 05/30/2023  9:07 AM EST -----  ----- Message ----- From: Carver Fila, MD Sent: 05/29/2023   6:44 PM EST To: Doylene Bode, NP; Keenan Bachelor, NT  Hi Marcelino Duster, You please get this patient scheduled for an MRI tomorrow?  The orders in.  Also, you can cancel her phone visit when you call her with the MRI results and her in person visit.  She will need to see me in 3 months for repeat biopsy.  I will also plan to call her once I get the MRI results. Thank you, Georgiann Hahn

## 2023-05-30 NOTE — Telephone Encounter (Signed)
Patient called back and was given the appts for tomorrow 1/24 and 4/24

## 2023-05-31 ENCOUNTER — Ambulatory Visit
Admission: RE | Admit: 2023-05-31 | Discharge: 2023-05-31 | Disposition: A | Discharge status: Home / Self Care | Payer: 59 | Source: Ambulatory Visit | Attending: Gynecologic Oncology | Admitting: Gynecologic Oncology

## 2023-05-31 ENCOUNTER — Other Ambulatory Visit: Payer: Self-pay

## 2023-05-31 DIAGNOSIS — C541 Malignant neoplasm of endometrium: Secondary | ICD-10-CM | POA: Insufficient documentation

## 2023-05-31 MED ORDER — GADOBUTROL 1 MMOL/ML IV SOLN
10.0000 mL | Freq: Once | INTRAVENOUS | Status: AC | PRN
  Administered 2023-05-31: 10 mL via INTRAVENOUS

## 2023-06-03 ENCOUNTER — Telehealth: Payer: 59 | Admitting: Gynecologic Oncology

## 2023-06-09 ENCOUNTER — Encounter: Payer: Self-pay | Admitting: Gynecologic Oncology

## 2023-06-19 ENCOUNTER — Encounter: Payer: 59 | Admitting: Gynecologic Oncology

## 2023-06-24 ENCOUNTER — Ambulatory Visit: Payer: 59 | Admitting: Thoracic Surgery (Cardiothoracic Vascular Surgery)

## 2023-07-01 ENCOUNTER — Ambulatory Visit: Payer: 59 | Admitting: Thoracic Surgery (Cardiothoracic Vascular Surgery)

## 2023-07-04 ENCOUNTER — Ambulatory Visit: Payer: 59 | Admitting: Thoracic Surgery (Cardiothoracic Vascular Surgery)

## 2023-08-28 ENCOUNTER — Inpatient Hospital Stay: Payer: 59 | Attending: Gynecologic Oncology | Admitting: Gynecologic Oncology

## 2023-08-28 ENCOUNTER — Encounter: Payer: Self-pay | Admitting: Gynecologic Oncology

## 2023-08-28 VITALS — BP 132/64 | HR 68 | Temp 98.4°F | Resp 17 | Ht 69.0 in | Wt 250.4 lb

## 2023-08-28 DIAGNOSIS — Z853 Personal history of malignant neoplasm of breast: Secondary | ICD-10-CM | POA: Diagnosis not present

## 2023-08-28 DIAGNOSIS — N939 Abnormal uterine and vaginal bleeding, unspecified: Secondary | ICD-10-CM | POA: Insufficient documentation

## 2023-08-28 DIAGNOSIS — Z79890 Hormone replacement therapy (postmenopausal): Secondary | ICD-10-CM | POA: Insufficient documentation

## 2023-08-28 DIAGNOSIS — C541 Malignant neoplasm of endometrium: Secondary | ICD-10-CM | POA: Diagnosis present

## 2023-08-28 DIAGNOSIS — C7A8 Other malignant neuroendocrine tumors: Secondary | ICD-10-CM | POA: Diagnosis not present

## 2023-08-28 DIAGNOSIS — Z859 Personal history of malignant neoplasm, unspecified: Secondary | ICD-10-CM

## 2023-08-28 NOTE — Progress Notes (Signed)
 Gynecologic Oncology Return Clinic Visit  08/28/23  Reason for Visit: follow-up  Treatment History: Oncology History Overview Note  Final Diagnosis    1.  Lymph node, right axilla, excision:   -One lymph node, negative for carcinoma (0/1).   2. Sentinel lymph node, right axilla #1, excision:   -One lymph node, negative for carcinoma; multiple levels examined (0/1).   3. Sentinel lymph node, right axilla #2, excision:   -One lymph node, negative for carcinoma; multiple levels examined (0/1).   4. Sentinel lymph node, right axilla #3, excision:   -Two lymph nodes, negative for carcinoma; multiple levels examined (0/2).   5.  Lymph node, right axilla, excision:   -One lymph node, positive for carcinoma (1/1). -Metastatic focus measures greater than 3 mm. -No extranodal extension is seen.   6.  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 -Invasive focus measures 12 mm microscopically (measured on slide 6E). 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 -Margins are negative for in situ and invasive carcinoma; closest is posterior at 7 mm and greater than 10 mm respectively. -Usual ductal hyperplasia, cystic apocrine metaplasia, fibroadenomatoid changes and biopsy site changes present. -pT1c (sn)N1a M-not applicable; AJCC stage IIA. -Please see synoptic report below.    # 04/20/2021: [Dr.Rao- ]Right breast invasive mammary carcinoma; declined neoadjuvant chemotherapy.  S/p mastectomy and SNL, pT1c(m) pN1a, ER low positive, PR negative and HER2 amplified-stage II.  Currently status post  adjuvant TCH-P x6 cycles [last 72/27]; Dr.Chrystal- [s/p  01/28/2022]. Phesgo  cycle 1 day 1   [01/18/2022]  # OCT  6th, 2023- Phesgo  every 3 weeks for total of 11 more cycles. [Total of 1 year].   # 07/13/2021: Status post FNA left lung nodule showing neoplastic cells consistent with low-grade neuroendocrine tumor [[facility]]. 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 Awaiting  PET/CT scan per Dr. Kizzie Perks.   # PCOS/DM-sec to steroids- metformin    Malignant neoplasm of central portion of right breast in female, estrogen receptor negative (HCC)  05/11/2021 Cancer Staging   Staging form: Breast, AJCC 8th Edition - Pathologic stage from 05/11/2021: Stage IIA (pT1c, pN1a(sn), cM0, G3, ER-, PR-, HER2+) - Signed by Avonne Boettcher, MD on 05/20/2021 Stage prefix: Initial diagnosis Method of lymph node assessment: Sentinel lymph node biopsy Multigene prognostic tests performed: None Histologic grading system: 3 grade system Residual tumor (R): R0 - None   05/20/2021 Initial Diagnosis   Malignant neoplasm of central portion of right breast in female, estrogen receptor negative (HCC)   08/07/2021 -  Chemotherapy   Patient is on Treatment Plan : BREAST DOCEtaxel + Trastuzumab + Pertuzumab  (THP) q21d x 8 cycles / Trastuzumab + Pertuzumab  q21d x 4 cycles      Patient was having menstrual cycles until her breast cancer diagnosis in 2022.  She describes these as very irregular, often skipping up to 3-6 months.  She denies any menstrual cycles since starting chemotherapy but had a week long episode of bleeding in October 2024 similar to a menses.  She denies any bleeding after that until her endometrial biopsy.  Pelvic ultrasound exam on 11/14 showed a uterus measuring 7.3 x 3.8 x 4.5 cm with an endometrium measuring 21 mm with scattered areas of  internal vascularity.  Bilateral ovaries normal in appearance.   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  Since her biopsy, she notes bleeding  that is sometimes spotting and at other times heavier bleeding.  05/28/23: Hysteroscopy, D&C (dilation and curettage), Mirena  IUD insertion. 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  MMRp, p53 WT.  05/31/23: MRI pelvis shows thickening of endometrium measuring up to 1.3 cm, prominent junctional zone but otherwise normal morphology and appearance of the uterus.  Mirena  IUD visualized.  No adenopathy or metastatic disease.  Interval History: Doing well.  Reports vaginal bleeding resolved completely as of about 2 weeks ago.  Was having more pink-tinged discharge until that time.  Had cramping episode right as her bleeding stopped.  Reports baseline bowel bladder function.  Past Medical/Surgical History: Past Medical History:  Diagnosis Date   Anemia 2023   Anxiety    Breast cancer (HCC)    Breast screening, unspecified 05/06/2010   Carcinoid tumor    Diabetes mellitus without complication (HCC)    Obesity 05/06/2010   Other benign neoplasm of connective and other soft tissue of trunk, unspecified 05/06/2010    Past Surgical History:  Procedure Laterality Date   CHOLECYSTECTOMY     CYST EXCISION  05/06/2006   from back   HYSTEROSCOPY WITH D & C N/A 05/28/2023   Procedure: DILATATION AND CURETTAGE / POSSIBLE HYSTEROSCOPY;  Surgeon: Suzi Essex, MD;  Location: [facility]S;  Service: Gynecology;  Laterality: N/A;   INTRAUTERINE DEVICE (IUD) INSERTION N/A 05/28/2023   Procedure: INTRAUTERINE DEVICE (IUD) INSERTION MIRENA ;  Surgeon: Suzi Essex, MD;  Location: [facility];  Service: Gynecology;  Laterality: N/A;   LIPOMA EXCISION Left 09/04/2010   flank   LOBECTOMY Left 01/23/2023   LYMPH NODE BIOPSY Left 01/23/2023   Procedure: LYMPH NODE BIOPSY;  Surgeon: Zelphia Higashi, MD;  Location: [facility] OR;  Service: Thoracic;  Laterality: Left;   MASTECTOMY Right 2022   PORT-A-CATH REMOVAL Left 01/23/2023   Procedure: REMOVAL PORT-A-CATH;  Surgeon:  Zelphia Higashi, MD;  Location: [facility] OR;  Service: Thoracic;  Laterality: Left;   TONSILLECTOMY      Family History  Problem Relation Age of Onset   Breast cancer Neg Hx     Social History   Socioeconomic History   Marital status: Married    Spouse name: Not on file   Number of children: 0   Years of education: Not on file   Highest education level: Not on file  Occupational History   Not on file  Tobacco Use   Smoking status: Never   Smokeless tobacco: Not on file  Vaping Use   Vaping status: Never Used  Substance and Sexual Activity   Alcohol use: Yes    Comment: ocassionally, 1-2 per month   Drug use: No   Sexual activity: Yes    Partners: Female    Birth control/protection: None  Other Topics Concern   Not on file  Social History Narrative   Not on file   Social Drivers of Health   Financial Resource Strain: Not on file  Food Insecurity: No Food Insecurity (01/23/2023)   Hunger Vital Sign    Worried About Running Out of Food in the Last Year: Never true    Ran Out of Food in the Last Year: Never true  Transportation Needs: No Transportation Needs (01/23/2023)   PRAPARE - Administrator, Civil Service (Medical): No  Lack of Transportation (Non-Medical): No  Physical Activity: Not on file  Stress: Stress Concern Present (05/21/2021)   Received from [facility], [facility] - Occupational Stress Questionnaire    Feeling of Stress : To some extent  Social Connections: Unknown (09/03/2021)   Received from [facility], [facility]   Social Network    Social Network: Not on file    Current Medications:  Current Outpatient Medications:    allopurinol  (ZYLOPRIM ) 100 MG tablet, Take 1 tablet (100 mg total) by mouth daily. Take for gout prevention., Disp: 90 tablet, Rfl: 3   atorvastatin  (LIPITOR) 10 MG tablet, Take 1 tablet (10 mg total) by mouth every evening., Disp: 90 tablet, Rfl: 3   colchicine   0.6 MG tablet, Take 1 tablet (0.6 mg total) by mouth daily as needed (gout flares)., Disp: 30 tablet, Rfl: 2   ibuprofen (ADVIL) 200 MG tablet, Take 200-600 mg by mouth every 6 (six) hours as needed for moderate pain (pain score 4-6)., Disp: , Rfl:    metFORMIN  (GLUCOPHAGE -XR) 750 MG 24 hr tablet, Take 1 tablet (750 mg total) by mouth daily with breakfast., Disp: 90 tablet, Rfl: 3   Multiple Vitamins-Minerals (MULTIVITAMIN WITH MINERALS) tablet, Take 1 tablet by mouth daily., Disp: , Rfl:   Current Facility-Administered Medications:    lidocaine  (PF) (XYLOCAINE ) 1 % injection 10 mL, 10 mL, Intradermal, Once,   Facility-Administered Medications Ordered in Other Visits:    heparin  lock flush 100 UNIT/ML injection, , , ,   Review of Systems: Denies appetite changes, fevers, chills, fatigue, unexplained weight changes. Denies hearing loss, neck lumps or masses, mouth sores, ringing in ears or voice changes. Denies cough or wheezing.  Denies shortness of breath. Denies chest pain or palpitations. Denies leg swelling. Denies abdominal distention, pain, blood in stools, constipation, diarrhea, nausea, vomiting, or early satiety. Denies pain with intercourse, dysuria, frequency, hematuria or incontinence. Denies hot flashes, pelvic pain, vaginal bleeding or vaginal discharge.   Denies joint pain, back pain or muscle pain/cramps. Denies itching, rash, or wounds. Denies dizziness, headaches, numbness or seizures. Denies swollen lymph nodes or glands, denies easy bruising or bleeding. Denies anxiety, depression, confusion, or decreased concentration.  Physical Exam: BP 132/64 (BP Location: Left Arm, Patient Position: Sitting)   Pulse 68   Temp 98.4 F (36.9 C) (Oral)   Resp 17   Ht 5\' 9"  (1.753 m)   Wt 250 lb 6.4 oz (113.6 kg)   SpO2 100%   BMI 36.98 kg/m  General: Alert, oriented, no acute distress. HEENT: Posterior oropharynx clear, sclera anicteric. Chest: Unlabored breathing on room  air. Abdomen: soft, nontender.  Normoactive bowel sounds.  No masses or hepatosplenomegaly appreciated.   Extremities: Grossly normal range of motion.  Warm, well perfused.  No edema bilaterally. GU: Normal appearing external genitalia without erythema, excoriation, or lesions.  Speculum exam reveals cervix normal in appearance, IUD strings visualized.  Bimanual exam reveals mobile uterus measuring 8 cm, no adnexal masses.    Endometrial biopsy procedure Preoperative diagnosis: endometrial cancer, Mirena  IUD in place for progesterone therapy Postoperative diagnosis: Same as above Physician: Orvil Bland MD Estimated blood loss: Minimal Specimens: Endometrial biopsy Procedure: After the procedure was discussed with the patient including risks and benefits, she gave verbal consent.  She was then placed in dorsolithotomy position and a speculum was placed in the vagina.  Once the cervix was well visualized it was cleansed with Betadine x3.  An endometrial Pipelle was then  passed to a depth of 8 cm.  2 passes were performed with sufficient tissue obtained.  This was placed in formalin.  Overall the patient tolerated the procedure well.  All instruments were removed from the vagina.  Laboratory & Radiologic Studies: None new  Assessment & Plan: Allison Harper is a 53 y.o. 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    Patient overall doing well, bleeding completely resolved.  Endometrial biopsy performed today without difficulty.  I will notify her with these results.  Plan for follow-up in 3 months with repeat biopsy.    20 minutes of total time was spent for this patient encounter, including preparation, face-to-face counseling with the patient and coordination of care, and documentation of the encounter.  Wiley Hanger, MD  Division of Gynecologic Oncology  Department of Obstetrics and Gynecology  [facility]

## 2023-08-28 NOTE — Patient Instructions (Signed)
 It was great to see you today.  I will let you know when I get your biopsy results back, likely early next week.  We will tentatively plan on a follow-up visit in 3 months.  Please let me know if you have any change in symptoms before then.

## 2023-08-29 ENCOUNTER — Other Ambulatory Visit: Payer: Self-pay

## 2023-09-01 ENCOUNTER — Encounter: Payer: Self-pay | Admitting: Gynecologic Oncology

## 2023-09-01 LAB — SURGICAL PATHOLOGY

## 2023-11-18 ENCOUNTER — Other Ambulatory Visit: Payer: Self-pay

## 2023-12-11 ENCOUNTER — Encounter: Payer: Self-pay | Admitting: Thoracic Surgery (Cardiothoracic Vascular Surgery)

## 2023-12-12 ENCOUNTER — Inpatient Hospital Stay: Attending: Gynecologic Oncology | Admitting: Gynecologic Oncology

## 2023-12-12 ENCOUNTER — Encounter: Payer: Self-pay | Admitting: Gynecologic Oncology

## 2023-12-12 VITALS — BP 138/75 | HR 60 | Temp 98.1°F | Resp 20 | Wt 253.6 lb

## 2023-12-12 DIAGNOSIS — Z975 Presence of (intrauterine) contraceptive device: Secondary | ICD-10-CM

## 2023-12-12 DIAGNOSIS — C541 Malignant neoplasm of endometrium: Secondary | ICD-10-CM

## 2023-12-12 DIAGNOSIS — Z853 Personal history of malignant neoplasm of breast: Secondary | ICD-10-CM

## 2023-12-12 NOTE — Patient Instructions (Signed)
 It was to see you today.  I will let you know when I get your biopsy results back next week.  We will tentatively plan on a follow-up visit in 3 months with another biopsy.  Please let me know if you have any change in symptoms before then.

## 2023-12-12 NOTE — Progress Notes (Signed)
 Gynecologic Oncology Return Clinic Visit  12/12/23  Reason for Visit: follow-up  Treatment History: Oncology History Overview Note  Final Diagnosis    1.  Lymph node, right axilla, excision:   -One lymph node, negative for carcinoma (0/1).   2. Sentinel lymph node, right axilla #1, excision:   -One lymph node, negative for carcinoma; multiple levels examined (0/1).   3. Sentinel lymph node, right axilla #2, excision:   -One lymph node, negative for carcinoma; multiple levels examined (0/1).   4. Sentinel lymph node, right axilla #3, excision:   -Two lymph nodes, negative for carcinoma; multiple levels examined (0/2).   5.  Lymph node, right axilla, excision:   -One lymph node, positive for carcinoma (1/1). -Metastatic focus measures greater than 3 mm. -No extranodal extension is seen.   6.  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 -Invasive focus measures 12 mm microscopically (measured on slide 6E). 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 -Margins are negative for in situ and invasive carcinoma; closest is posterior at 7 mm and greater than 10 mm respectively. -Usual ductal hyperplasia, cystic apocrine metaplasia, fibroadenomatoid changes and biopsy site changes present. -pT1c (sn)N1a M-not applicable; AJCC stage IIA. -Please see synoptic report below.    # 04/20/2021: [Dr.Rao- ]Right breast invasive mammary carcinoma; declined neoadjuvant chemotherapy.  S/p mastectomy and SNL, pT1c(m) pN1a, ER low positive, PR negative and HER2 amplified-stage II.  Currently status post  adjuvant TCH-P x6 cycles [last 72/27]; Dr.Chrystal- [s/p  01/28/2022]. Phesgo  cycle 1 day 1   [01/18/2022]  # OCT  6th, 2023- Phesgo  every 3 weeks for total of 11 more cycles. [Total of 1 year].   # 07/13/2021: Status post FNA left lung nodule showing neoplastic cells consistent with low-grade neuroendocrine tumor [[facility]]. 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 Awaiting  PET/CT scan per Dr. Velda.   # PCOS/DM-sec to steroids- metformin    Malignant neoplasm of central portion of right breast in female, estrogen receptor negative (HCC)  05/11/2021 Cancer Staging   Staging form: Breast, AJCC 8th Edition - Pathologic stage from 05/11/2021: Stage IIA (pT1c, pN1a(sn), cM0, G3, ER-, PR-, HER2+) - Signed by Melanee Annah BROCKS, MD on 05/20/2021 Stage prefix: Initial diagnosis Method of lymph node assessment: Sentinel lymph node biopsy Multigene prognostic tests performed: None Histologic grading system: 3 grade system Residual tumor (R): R0 - None   05/20/2021 Initial Diagnosis   Malignant neoplasm of central portion of right breast in female, estrogen receptor negative (HCC)   08/07/2021 -  Chemotherapy   Patient is on Treatment Plan : BREAST DOCEtaxel + Trastuzumab + Pertuzumab  (THP) q21d x 8 cycles / Trastuzumab + Pertuzumab  q21d x 4 cycles      Patient was having menstrual cycles until her breast cancer diagnosis in 2022.  She describes these as very irregular, often skipping up to 3-6 months.  She denies any menstrual cycles since starting chemotherapy but had a week long episode of bleeding in October 2024 similar to a menses.  She denies any bleeding after that until her endometrial biopsy.  Pelvic ultrasound exam on 11/14 showed a uterus measuring 7.3 x 3.8 x 4.5 cm with an endometrium measuring 21 mm with scattered areas of  internal vascularity.  Bilateral ovaries normal in appearance.   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  Since her biopsy, she notes bleeding  that is sometimes spotting and at other times heavier bleeding.   05/28/23: Hysteroscopy, D&C (dilation and curettage), Mirena  IUD insertion. 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  MMRp, p53 WT.   05/31/23: MRI pelvis shows thickening of endometrium measuring up to 1.3 cm, prominent junctional zone but otherwise normal morphology and appearance of the uterus.  Mirena  IUD visualized.  No adenopathy or metastatic disease.  Interval History: Doing very well.  Denies any vaginal bleeding, pelvic pain, or other symptoms since her last visit.  Past Medical/Surgical History: Past Medical History:  Diagnosis Date   Anemia 2023   Anxiety    Breast cancer (HCC)    Breast screening, unspecified 05/06/2010   Carcinoid tumor    Diabetes mellitus without complication (HCC)    Obesity 05/06/2010   Other benign neoplasm of connective and other soft tissue of trunk, unspecified 05/06/2010    Past Surgical History:  Procedure Laterality Date   CHOLECYSTECTOMY     CYST EXCISION  05/06/2006   from back   HYSTEROSCOPY WITH D & C N/A 05/28/2023   Procedure: DILATATION AND CURETTAGE / POSSIBLE HYSTEROSCOPY;  Surgeon: Viktoria Comer SAUNDERS, MD;  Location: [facility];  Service: Gynecology;  Laterality: N/A;   INTRAUTERINE DEVICE (IUD) INSERTION N/A 05/28/2023   Procedure: INTRAUTERINE DEVICE (IUD) INSERTION MIRENA ;  Surgeon: Viktoria Comer SAUNDERS, MD;  Location: [facility];  Service: Gynecology;  Laterality: N/A;   LIPOMA EXCISION Left 09/04/2010   flank   LOBECTOMY Left 01/23/2023   LYMPH NODE BIOPSY Left 01/23/2023   Procedure: LYMPH NODE BIOPSY;  Surgeon: Kerrin Elspeth BROCKS, MD;  Location: [facility] OR;  Service: Thoracic;  Laterality: Left;   MASTECTOMY Right 2022   PORT-A-CATH REMOVAL Left 01/23/2023   Procedure: REMOVAL PORT-A-CATH;  Surgeon: Kerrin Elspeth BROCKS, MD;  Location: [facility] OR;  Service: Thoracic;  Laterality: Left;   TONSILLECTOMY      Family History  Problem  Relation Age of Onset   Breast cancer Neg Hx     Social History   Socioeconomic History   Marital status: Married    Spouse name: Not on file   Number of children: 0   Years of education: Not on file   Highest education level: Not on file  Occupational History   Not on file  Tobacco Use   Smoking status: Never   Smokeless tobacco: Not on file  Vaping Use   Vaping status: Never Used  Substance and Sexual Activity   Alcohol use: Yes    Comment: ocassionally, 1-2 per month   Drug use: No   Sexual activity: Yes    Partners: Female    Birth control/protection: None  Other Topics Concern   Not on file  Social History Narrative   Not on file   Social Drivers of Health   Financial Resource Strain: Not on file  Food Insecurity: No Food Insecurity (01/23/2023)   Hunger Vital Sign    Worried About Running Out of Food in the Last Year: Never true    Ran Out of Food in the Last Year: Never true  Transportation Needs: No Transportation Needs (01/23/2023)   PRAPARE - Administrator, Civil Service (Medical): No    Lack of Transportation (Non-Medical): No  Physical Activity: Not on file  Stress: Stress Concern Present (  05/21/2021)   Received from [facility] - Occupational Stress Questionnaire    Feeling of Stress : To some extent  Social Connections: Unknown (09/03/2021)   Received from [facility]   Social Network    Social Network: Not on file    Current Medications:  Current Outpatient Medications:    allopurinol  (ZYLOPRIM ) 100 MG tablet, Take 1 tablet (100 mg total) by mouth daily. Take for gout prevention., Disp: 90 tablet, Rfl: 3   atorvastatin  (LIPITOR) 10 MG tablet, Take 1 tablet (10 mg total) by mouth every evening., Disp: 90 tablet, Rfl: 3   colchicine  0.6 MG tablet, Take 1 tablet (0.6 mg total) by mouth daily as needed (gout flares)., Disp: 30 tablet, Rfl: 2   ibuprofen (ADVIL) 200 MG tablet, Take 200-600 mg by  mouth every 6 (six) hours as needed for moderate pain (pain score 4-6)., Disp: , Rfl:    metFORMIN  (GLUCOPHAGE -XR) 750 MG 24 hr tablet, Take 1 tablet (750 mg total) by mouth daily with breakfast., Disp: 90 tablet, Rfl: 3   Multiple Vitamins-Minerals (MULTIVITAMIN WITH MINERALS) tablet, Take 1 tablet by mouth daily., Disp: , Rfl:   Current Facility-Administered Medications:    lidocaine  (PF) (XYLOCAINE ) 1 % injection 10 mL, 10 mL, Intradermal, Once,   Facility-Administered Medications Ordered in Other Visits:    heparin  lock flush 100 UNIT/ML injection, , , ,   Review of Systems: Denies appetite changes, fevers, chills, fatigue, unexplained weight changes. Denies hearing loss, neck lumps or masses, mouth sores, ringing in ears or voice changes. Denies cough or wheezing.  Denies shortness of breath. Denies chest pain or palpitations. Denies leg swelling. Denies abdominal distention, pain, blood in stools, constipation, diarrhea, nausea, vomiting, or early satiety. Denies pain with intercourse, dysuria, frequency, hematuria or incontinence. Denies hot flashes, pelvic pain, vaginal bleeding or vaginal discharge.   Denies joint pain, back pain or muscle pain/cramps. Denies itching, rash, or wounds. Denies dizziness, headaches, numbness or seizures. Denies swollen lymph nodes or glands, denies easy bruising or bleeding. Denies anxiety, depression, confusion, or decreased concentration.  Physical Exam: BP 138/75 (BP Location: Left Arm, Patient Position: Sitting)   Pulse 60   Temp 98.1 F (36.7 C) (Oral)   Resp 20   Wt 253 lb 9.6 oz (115 kg)   SpO2 99%   BMI 37.45 kg/m  General: Alert, oriented, no acute distress. HEENT: Posterior oropharynx clear, sclera anicteric. Chest: Unlabored breathing on room air. GU: External vaginal genitalia normal in appearance.  Vaginal mucosa well-rugated, no lesions.  Cervix normal, IUD string seen protruding approximately 4 cm from the external  os.  Endometrial biopsy procedure Preoperative diagnosis: endometrial cancer, Mirena  IUD in place for progesterone therapy Postoperative diagnosis: Same as above Physician: Viktoria MD Estimated blood loss: Minimal Specimens: Endometrial biopsy Procedure: After the procedure was discussed with the patient including risks and benefits, she gave consent.  She was then placed in dorsolithotomy position and a speculum was placed in the vagina.  Cervix was normal in appearance with IUD strings visualized.  Once the cervix was well visualized it was cleansed with Betadine x3.  An endometrial Pipelle was then passed to a depth of 8 cm.  2 passes were performed with sufficient tissue obtained.  This was placed in formalin.  Overall the patient tolerated the procedure well.  All instruments were removed from the vagina.  Laboratory & Radiologic Studies: None new  Assessment & Plan: Allison Harper is a 54  y.o. 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   MMRp, p53 WT. First biopsy after IUD placement with no hyperplasia, atypia or carcinoma.   Patient overall doing well, bleeding completely resolved.  Endometrial biopsy performed today without difficulty.  I will notify her with these results.  Plan for follow-up in 3 months with repeat biopsy.    20 minutes of total time was spent for this patient encounter, including preparation, face-to-face counseling with the patient and coordination of care, and documentation of the encounter.  Comer Dollar, MD  Division of Gynecologic Oncology  Department of Obstetrics and Gynecology  [facility]   [facility]

## 2023-12-13 ENCOUNTER — Other Ambulatory Visit: Payer: Self-pay

## 2023-12-16 ENCOUNTER — Ambulatory Visit: Payer: Self-pay | Admitting: Gynecologic Oncology

## 2023-12-16 ENCOUNTER — Ambulatory Visit

## 2023-12-16 LAB — SURGICAL PATHOLOGY

## 2023-12-17 ENCOUNTER — Telehealth: Payer: Self-pay | Admitting: Gynecologic Oncology

## 2023-12-17 NOTE — Telephone Encounter (Signed)
 Called the patient after speaking with pathology.  Her recent biopsy shows areas of progesterone effect with focal low-grade endometrial cancer.  We discussed that this is an unexpected change and it is possible that on her last biopsy, we simply did not sample endometrial cancer that was present.  I offered that we could move forward with planning for definitive surgery now.  I do think it is reasonable to repeat a biopsy in 3 months.  If we are still seeing residual endometrial cancer on that biopsy, I think we will need to revisit the idea of definitive surgery.  Patient wishes to proceed with repeat biopsy in 3 months.  I have asked her to call my office if at any point between now and then she develops new symptoms or decides that she would like to schedule definitive surgery sooner.  Comer Dollar MD Gynecologic Oncology

## 2023-12-24 ENCOUNTER — Other Ambulatory Visit: Payer: Self-pay | Admitting: Thoracic Surgery (Cardiothoracic Vascular Surgery)

## 2023-12-24 DIAGNOSIS — Z902 Acquired absence of lung [part of]: Secondary | ICD-10-CM

## 2023-12-30 ENCOUNTER — Other Ambulatory Visit: Payer: Self-pay | Admitting: Thoracic Surgery (Cardiothoracic Vascular Surgery)

## 2023-12-30 ENCOUNTER — Ambulatory Visit (HOSPITAL_COMMUNITY)
Admission: RE | Admit: 2023-12-30 | Discharge: 2023-12-30 | Disposition: A | Discharge status: Home / Self Care | Source: Ambulatory Visit | Attending: Cardiovascular Disease | Admitting: Cardiovascular Disease

## 2023-12-30 ENCOUNTER — Ambulatory Visit
Attending: Thoracic Surgery (Cardiothoracic Vascular Surgery) | Admitting: Thoracic Surgery (Cardiothoracic Vascular Surgery)

## 2023-12-30 ENCOUNTER — Encounter: Payer: Self-pay | Admitting: Thoracic Surgery (Cardiothoracic Vascular Surgery)

## 2023-12-30 VITALS — BP 121/81 | HR 87 | Resp 20 | Ht 69.0 in | Wt 255.7 lb

## 2023-12-30 DIAGNOSIS — Z902 Acquired absence of lung [part of]: Secondary | ICD-10-CM | POA: Diagnosis not present

## 2023-12-30 DIAGNOSIS — C3490 Malignant neoplasm of unspecified part of unspecified bronchus or lung: Secondary | ICD-10-CM | POA: Diagnosis present

## 2023-12-30 NOTE — Progress Notes (Signed)
 [address]       [place] [address]             [phone]       HPI: Ms. Sirek returns for a follow-up visit after lobectomy for carcinoid tumors.  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  She underwent robotic assisted left lower lobectomy on 01/23/2023 for synchronous stage Ia, low-grade typical carcinoid tumors.  Postoperative course was notable for some paresthesias and swelling along the costal margin.  She was only on narcotics for a brief time.  Did not feel like gabapentin  helped much.  Couple of months after her lung surgery she was diagnosed with endometrial cancer.  She is treating that with a Mirena  IUD.  She has not had any significant respiratory issues but continues to have fullness and swelling along her left costal margin.  Also has pain which occasionally she will use ibuprofen for.  She says she uses an ice pack about every day.  Past Medical History:  Diagnosis Date   Anemia 2023   Anxiety    Breast cancer (HCC)    Breast screening, unspecified 05/06/2010   Carcinoid tumor    Diabetes mellitus without complication (HCC)    Obesity 05/06/2010   Other benign neoplasm of connective and other soft tissue of trunk, unspecified 05/06/2010    Current Outpatient Medications  Medication Sig Dispense Refill   allopurinol  (ZYLOPRIM ) 100 MG tablet Take 1 tablet (100 mg total) by mouth daily. Take for gout prevention. 90 tablet 3   atorvastatin  (LIPITOR) 10 MG tablet Take 1 tablet (10 mg total) by mouth every evening. 90 tablet 3   colchicine  0.6 MG tablet Take 1 tablet (0.6 mg total) by mouth daily as needed (gout flares). 30 tablet 2   ibuprofen (ADVIL) 200 MG tablet Take 200-600 mg by mouth every 6 (six) hours as needed  for moderate pain (pain score 4-6).     metFORMIN  (GLUCOPHAGE -XR) 750 MG 24 hr tablet Take 1 tablet (750 mg total) by mouth daily with breakfast. 90 tablet 3   Multiple Vitamins-Minerals (MULTIVITAMIN WITH MINERALS) tablet Take 1 tablet by mouth daily.     Current Facility-Administered Medications  Medication Dose Route Frequency Provider Last Rate Last Admin   lidocaine  (PF) (XYLOCAINE ) 1 % injection 10 mL  10 mL Intradermal Once        Facility-Administered Medications Ordered in Other Visits  Medication Dose Route Frequency Provider Last Rate Last Admin   heparin  lock flush 100 UNIT/ML injection             Physical Exam BP 121/81 (BP Location: Left Arm, Patient Position: Sitting, Cuff Size: Large)   Pulse 87   Resp 20   Ht 5' 9 (1.753 m)   Wt 255 lb 11.2 oz (116 kg)   SpO2 95% Comment: RA  BMI 37.55 kg/m  53 year old woman in no acute distress Alert and oriented x 3 with no focal deficits Lungs diminished at left base but otherwise clear Incisions well-healed Fullness along left costal margin with muscular laxity of the left upper quadrant  Diagnostic Tests: Chest x-ray shows no change in elevated left hemidiaphragm.  No suspicious nodules.  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  Synchronous low-grade carcinoid tumors-status post left lower lobectomy.  No longer followed by Dr. Rennie.  Will need CT follow-up.  Will plan to rescan her in September which will be a year from her surgery.  Status post left lower lobectomy-continues to have some paresthesias and also muscular laxity.  At this point I do not expect that to improve significantly.  There are no restrictions on her activities.  Endometrial cancer-followed by Dr.  Viktoria  Plan: CT chest 1 year follow-up after resection of carcinoid tumors Return in 1 month to discuss results  Elspeth JAYSON Millers, MD [facility] [phone]

## 2023-12-31 ENCOUNTER — Encounter: Payer: Self-pay | Admitting: Thoracic Surgery (Cardiothoracic Vascular Surgery)

## 2023-12-31 ENCOUNTER — Other Ambulatory Visit: Payer: Self-pay | Admitting: Thoracic Surgery (Cardiothoracic Vascular Surgery)

## 2023-12-31 DIAGNOSIS — Z902 Acquired absence of lung [part of]: Secondary | ICD-10-CM

## 2023-12-31 DIAGNOSIS — D3A090 Benign carcinoid tumor of the bronchus and lung: Secondary | ICD-10-CM

## 2024-02-02 ENCOUNTER — Encounter: Payer: Self-pay | Admitting: Thoracic Surgery (Cardiothoracic Vascular Surgery)

## 2024-03-15 ENCOUNTER — Other Ambulatory Visit: Payer: Self-pay | Admitting: Physician Assistant

## 2024-03-15 DIAGNOSIS — M1000 Idiopathic gout, unspecified site: Secondary | ICD-10-CM

## 2024-03-15 DIAGNOSIS — E1165 Type 2 diabetes mellitus with hyperglycemia: Secondary | ICD-10-CM

## 2024-03-15 DIAGNOSIS — E782 Mixed hyperlipidemia: Secondary | ICD-10-CM

## 2024-03-15 MED ORDER — METFORMIN HCL ER 750 MG PO TB24: 750.0000 mg | ORAL_TABLET | Freq: Every day | ORAL | 0 refills | Status: AC

## 2024-03-15 MED ORDER — ATORVASTATIN CALCIUM 10 MG PO TABS
10.0000 mg | ORAL_TABLET | Freq: Every evening | ORAL | 0 refills | Status: AC
Start: 2024-03-15 — End: ?

## 2024-03-15 NOTE — Addendum Note (Signed)
 Addended by: KEARNEY KOLEEN RAMAN on: 03/15/2024 02:54 PM   Modules accepted: Orders

## 2024-03-18 ENCOUNTER — Inpatient Hospital Stay: Attending: Gynecologic Oncology | Admitting: Gynecologic Oncology

## 2024-03-18 ENCOUNTER — Encounter: Payer: Self-pay | Admitting: Gynecologic Oncology

## 2024-03-18 VITALS — BP 139/74 | HR 93 | Temp 99.8°F | Resp 19 | Wt 250.6 lb

## 2024-03-18 DIAGNOSIS — Z79890 Hormone replacement therapy (postmenopausal): Secondary | ICD-10-CM | POA: Insufficient documentation

## 2024-03-18 DIAGNOSIS — Z975 Presence of (intrauterine) contraceptive device: Secondary | ICD-10-CM | POA: Insufficient documentation

## 2024-03-18 DIAGNOSIS — C541 Malignant neoplasm of endometrium: Secondary | ICD-10-CM | POA: Insufficient documentation

## 2024-03-18 NOTE — Progress Notes (Signed)
 Gynecologic Oncology Return Clinic Visit  03/18/24  Reason for Visit: follow-up  Treatment History: Oncology History Overview Note  Final Diagnosis    1.  Lymph node, right axilla, excision:   -One lymph node, negative for carcinoma (0/1).   2. Sentinel lymph node, right axilla #1, excision:   -One lymph node, negative for carcinoma; multiple levels examined (0/1).   3. Sentinel lymph node, right axilla #2, excision:   -One lymph node, negative for carcinoma; multiple levels examined (0/1).   4. Sentinel lymph node, right axilla #3, excision:   -Two lymph nodes, negative for carcinoma; multiple levels examined (0/2).   5.  Lymph node, right axilla, excision:   -One lymph node, positive for carcinoma (1/1). -Metastatic focus measures greater than 3 mm. -No extranodal extension is seen.   6.  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 -Invasive focus measures 12 mm microscopically (measured on slide 6E). 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 -Margins are negative for in situ and invasive carcinoma; closest is posterior at 7 mm and greater than 10 mm respectively. -Usual ductal hyperplasia, cystic apocrine metaplasia, fibroadenomatoid changes and biopsy site changes present. -pT1c (sn)N1a M-not applicable; AJCC stage IIA. -Please see synoptic report below.    # 04/20/2021: [Dr.Rao- ]Right breast invasive mammary carcinoma; declined neoadjuvant chemotherapy.  S/p mastectomy and SNL, pT1c(m) pN1a, ER low positive, PR negative and HER2 amplified-stage II.  Currently status post  adjuvant TCH-P x6 cycles [last 72/27]; Dr.Chrystal- [s/p  01/28/2022]. Phesgo  cycle 1 day 1   [01/18/2022]  # OCT  6th, 2023- Phesgo  every 3 weeks for total of 11 more cycles. [Total of 1 year].   # 07/13/2021: Status post FNA left lung nodule showing neoplastic cells consistent with low-grade neuroendocrine tumor [[facility]]. 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 Awaiting  PET/CT scan per Dr. Velda.   # PCOS/DM-sec to steroids- metformin    Malignant neoplasm of central portion of right breast in female, estrogen receptor negative (HCC)  05/11/2021 Cancer Staging   Staging form: Breast, AJCC 8th Edition - Pathologic stage from 05/11/2021: Stage IIA (pT1c, pN1a(sn), cM0, G3, ER-, PR-, HER2+) - Signed by Melanee Annah BROCKS, MD on 05/20/2021 Stage prefix: Initial diagnosis Method of lymph node assessment: Sentinel lymph node biopsy Multigene prognostic tests performed: None Histologic grading system: 3 grade system Residual tumor (R): R0 - None   05/20/2021 Initial Diagnosis   Malignant neoplasm of central portion of right breast in female, estrogen receptor negative (HCC)   08/07/2021 -  Chemotherapy   Patient is on Treatment Plan : BREAST DOCEtaxel + Trastuzumab + Pertuzumab  (THP) q21d x 8 cycles / Trastuzumab + Pertuzumab  q21d x 4 cycles      Patient was having menstrual cycles until her breast cancer diagnosis in 2022.  She describes these as very irregular, often skipping up to 3-6 months.  She denies any menstrual cycles since starting chemotherapy but had a week long episode of bleeding in October 2024 similar to a menses.  She denies any bleeding after that until her endometrial biopsy.  Pelvic ultrasound exam on 11/14 showed a uterus measuring 7.3 x 3.8 x 4.5 cm with an endometrium measuring 21 mm with scattered areas of  internal vascularity.  Bilateral ovaries normal in appearance.   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  Since her biopsy, she notes bleeding  that is sometimes spotting and at other times heavier bleeding.   05/28/23: Hysteroscopy, D&C (dilation and curettage), Mirena  IUD insertion. 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  MMRp, p53 WT.   05/31/23: MRI pelvis shows thickening of endometrium measuring up to 1.3 cm, prominent junctional zone but otherwise normal morphology and appearance of the uterus.  Mirena  IUD visualized.  No adenopathy or metastatic disease.  08/24/2023: Endometrial biopsy shows benign mixed pattern endometrium with marked progestin effect, benign endometrial polyp, no atypia, hyperplasia, or carcinoma.  12/12/2023: Endometrial biopsy shows low-grade endometrial carcinoma with progestin effect.  Interval History: Doing well.  Denies any vaginal bleeding or pelvic pain since her last visit with me.  Past Medical/Surgical History: Past Medical History:  Diagnosis Date   Anemia 2023   Anxiety    Breast cancer (HCC)    Breast screening, unspecified 05/06/2010   Carcinoid tumor (HCC)    Diabetes mellitus without complication (HCC)    Obesity 05/06/2010   Other benign neoplasm of connective and other soft tissue of trunk, unspecified 05/06/2010    Past Surgical History:  Procedure Laterality Date   CHOLECYSTECTOMY     CYST EXCISION  05/06/2006   from back   HYSTEROSCOPY WITH D & C N/A 05/28/2023   Procedure: DILATATION AND CURETTAGE / POSSIBLE HYSTEROSCOPY;  Surgeon: Viktoria Comer SAUNDERS, MD;  Location: [facility];  Service: Gynecology;  Laterality: N/A;   INTRAUTERINE DEVICE (IUD) INSERTION N/A 05/28/2023   Procedure: INTRAUTERINE DEVICE (IUD) INSERTION MIRENA ;  Surgeon: Viktoria Comer SAUNDERS, MD;  Location: [facility];  Service: Gynecology;  Laterality: N/A;   LIPOMA EXCISION Left 09/04/2010   flank   LOBECTOMY Left 01/23/2023   LYMPH NODE BIOPSY Left 01/23/2023   Procedure: LYMPH NODE BIOPSY;  Surgeon: Kerrin Elspeth BROCKS, MD;  Location: [facility] OR;  Service: Thoracic;  Laterality: Left;    MASTECTOMY Right 2022   PORT-A-CATH REMOVAL Left 01/23/2023   Procedure: REMOVAL PORT-A-CATH;  Surgeon: Kerrin Elspeth BROCKS, MD;  Location: [facility] OR;  Service: Thoracic;  Laterality: Left;   TONSILLECTOMY      Family History  Problem Relation Age of Onset   Breast cancer Neg Hx     Social History   Socioeconomic History   Marital status: Married    Spouse name: Not on file   Number of children: 0   Years of education: Not on file   Highest education level: Not on file  Occupational History   Not on file  Tobacco Use   Smoking status: Never   Smokeless tobacco: Not on file  Vaping Use   Vaping status: Never Used  Substance and Sexual Activity   Alcohol use: Yes    Comment: ocassionally, 1-2 per month   Drug use: No   Sexual activity: Yes    Partners: Female    Birth control/protection: None  Other Topics Concern   Not on file  Social History Narrative   Not on file   Social Drivers of Health   Financial Resource Strain: Not on file  Food Insecurity: No Food Insecurity (01/23/2023)   Hunger Vital Sign    Worried About Running Out of Food in the Last Year: Never true    Ran Out of Food in the Last Year: Never true  Transportation Needs: No Transportation Needs (01/23/2023)  PRAPARE - Administrator, Civil Service (Medical): No    Lack of Transportation (Non-Medical): No  Physical Activity: Not on file  Stress: Stress Concern Present (05/21/2021)   Received from [facility] - Occupational Stress Questionnaire    Feeling of Stress : To some extent  Social Connections: Not on file    Current Medications:  Current Outpatient Medications:    allopurinol  (ZYLOPRIM ) 100 MG tablet, TAKE 1 TABLET DAILY FOR    GOUT PREVENTION, Disp: 30 tablet, Rfl: 0   atorvastatin  (LIPITOR) 10 MG tablet, Take 1 tablet (10 mg total) by mouth every evening., Disp: 30 tablet, Rfl: 0   colchicine  0.6 MG tablet, Take 1 tablet (0.6 mg  total) by mouth daily as needed (gout flares)., Disp: 30 tablet, Rfl: 2   metFORMIN  (GLUCOPHAGE -XR) 750 MG 24 hr tablet, Take 1 tablet (750 mg total) by mouth daily with breakfast., Disp: 30 tablet, Rfl: 0   Multiple Vitamins-Minerals (MULTIVITAMIN WITH MINERALS) tablet, Take 1 tablet by mouth daily., Disp: , Rfl:   Current Facility-Administered Medications:    lidocaine  (PF) (XYLOCAINE ) 1 % injection 10 mL, 10 mL, Intradermal, Once,   Facility-Administered Medications Ordered in Other Visits:    heparin  lock flush 100 UNIT/ML injection, , , ,   Review of Systems: Denies appetite changes, fevers, chills, fatigue, unexplained weight changes. Denies hearing loss, neck lumps or masses, mouth sores, ringing in ears or voice changes. Denies cough or wheezing.  Denies shortness of breath. Denies chest pain or palpitations. Denies leg swelling. Denies abdominal distention, pain, blood in stools, constipation, diarrhea, nausea, vomiting, or early satiety. Denies pain with intercourse, dysuria, frequency, hematuria or incontinence. Denies hot flashes, pelvic pain, vaginal bleeding or vaginal discharge.   Denies joint pain, back pain or muscle pain/cramps. Denies itching, rash, or wounds. Denies dizziness, headaches, numbness or seizures. Denies swollen lymph nodes or glands, denies easy bruising or bleeding. Denies anxiety, depression, confusion, or decreased concentration.  Physical Exam: BP 139/74 (BP Location: Right Arm, Patient Position: Sitting)   Pulse 93   Temp 99.8 F (37.7 C) (Oral)   Resp 19   Wt 250 lb 9.6 oz (113.7 kg)   SpO2 97%   BMI 37.01 kg/m  General: Alert, oriented, no acute distress. HEENT: Posterior oropharynx clear, sclera anicteric. Chest: Unlabored breathing on room air.  Lungs clear to auscultation bilaterally. Cardiovascular: Regular rate and rhythm, no murmurs or rubs appreciated. Abdomen: Soft, nondistended, nontender, no masses appreciated. GU: External  vaginal genitalia normal in appearance.  Vaginal mucosa well-rugated, no lesions.  Cervix normal, IUD string seen protruding approximately 4 cm from the external os.   Endometrial biopsy procedure Preoperative diagnosis: endometrial cancer, Mirena  IUD in place for progesterone therapy Postoperative diagnosis: Same as above Physician: Viktoria MD Estimated blood loss: Minimal Specimens: Endometrial biopsy Procedure: After the procedure was discussed with the patient including risks and benefits, she gave consent.  She was then placed in dorsolithotomy position and a speculum was placed in the vagina.  Cervix was normal in appearance with IUD strings visualized.  Once the cervix was well visualized it was cleansed with Betadine x3.  An endometrial Pipelle was then passed to a depth of just under 8 cm.  1 pass were performed with sufficient tissue obtained.  This was placed in formalin.  Overall the patient tolerated the procedure well.  All instruments were removed from the vagina.  Laboratory & Radiologic Studies: None new  Assessment &  Plan: Allison Harper is a 53 y.o. 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   MMRp, p53 WT. Pelvic MRI with thickening of the endometrium and prominent junctional zone but otherwise normal morphology and appearance of the uterus.  IUD visualized.  No adenopathy or metastatic disease. First biopsy after IUD placement with no hyperplasia, atypia or carcinoma.  Next biopsy showed low-grade carcinoma.  Repeat biopsy performed today.  I will call the patient with these results.  If persistent low-grade endometrial cancer still seen, we will discuss her readiness for surgery.  If some regression noted, likely plan to continue with progestin treatment and rebiopsy in 3 months.  20 minutes of total time was spent for this patient encounter, including preparation, face-to-face counseling with the  patient and coordination of care, and documentation of the encounter.  Comer Dollar, MD  Division of Gynecologic Oncology  Department of Obstetrics and Gynecology  [facility]

## 2024-03-18 NOTE — Patient Instructions (Signed)
 It was good to see you today.    I will call you when I have your biopsy results back next week.  We will tentatively plan on a follow-up in person in 3 months.  As always, if you develop any new and concerning symptoms before your next visit, please call to see me sooner.

## 2024-03-19 ENCOUNTER — Other Ambulatory Visit: Payer: Self-pay

## 2024-03-22 ENCOUNTER — Ambulatory Visit: Payer: Self-pay | Admitting: Gynecologic Oncology

## 2024-03-22 LAB — SURGICAL PATHOLOGY

## 2024-03-25 ENCOUNTER — Encounter: Payer: Self-pay | Admitting: Gynecologic Oncology

## 2024-03-25 NOTE — Telephone Encounter (Signed)
 Called the patient.  Spoke about her biopsy.  Discussed options including adding oral progestin or antiestrogen if she continues to wish to defer surgery.  We reviewed again that standard of care treatment for this is surgery and my concern that we are now at approximately 9 months of progestin treatment with the IUD with no change in her biopsy.  I think her biopsy from April showing no EIN or malignancy was the outlier and maybe just did not sample cancer that was present.  Patient is going to speak with her wife and send me a message later tonight or tomorrow.  We discussed that I have openings for surgery on 12/3 or 12/4 if she is amenable to proceeding with definitive surgery.  Otherwise, we will likely plan to add some oral progestin.  Comer Dollar MD Gynecologic Oncology

## 2024-03-30 ENCOUNTER — Other Ambulatory Visit: Payer: Self-pay | Admitting: Gynecologic Oncology

## 2024-03-30 ENCOUNTER — Telehealth: Payer: Self-pay | Admitting: *Deleted

## 2024-03-30 DIAGNOSIS — C541 Malignant neoplasm of endometrium: Secondary | ICD-10-CM

## 2024-03-30 NOTE — Telephone Encounter (Signed)
 Spoke with the patient and gave her an appt for phone visit tomorrow.

## 2024-03-30 NOTE — Telephone Encounter (Signed)
 Spoke with the patient and attempted to schedule a phone pre op with Dr Viktoria on 12/4. Patient stated that I don't want to wait until then to talk with Dr Viktoria regarding her surgery and her questions. I have things I need to get in a row at home and work before I have the surgery. She was going to answer my questions and I haven't heard from her.   Explained that Dr Viktoria is not in the office today but at [facility]. Explained that the message would be given to Dr Viktoria and the office or Dr Viktoria will get back with her.   Patient stated Who do I talk to about the surgery date? Explained that would be [employer], APP.  Explained that per her request for surgery the week of 12/21, the surgery is tentatively scheduled for 12/24. Dr Viktoria mentioned that there may be something available on 12/3 also. Explained that the message would be given to Eleanor Epps, APP

## 2024-03-30 NOTE — Telephone Encounter (Signed)
 Please call her and offer phone visit for tomorrow. Thank you

## 2024-03-31 ENCOUNTER — Inpatient Hospital Stay: Admitting: Gynecologic Oncology

## 2024-03-31 ENCOUNTER — Telehealth: Payer: Self-pay

## 2024-03-31 ENCOUNTER — Encounter: Payer: Self-pay | Admitting: Gynecologic Oncology

## 2024-03-31 DIAGNOSIS — Z7189 Other specified counseling: Secondary | ICD-10-CM

## 2024-03-31 DIAGNOSIS — C541 Malignant neoplasm of endometrium: Secondary | ICD-10-CM

## 2024-03-31 NOTE — Telephone Encounter (Signed)
 Per Eleanor Epps NP, I reached out to Ms. Bok with surgery date options: 12/4,12/24, 12/31 and 1/8. Pt states she will think about those dates and let Dr.Tucker know at the phone visit appointment today.

## 2024-03-31 NOTE — Progress Notes (Signed)
 Gynecologic Oncology Telehealth Note: Gyn-Onc  I connected with Allison Harper on 03/31/24 at  6:00 PM EST by telephone and verified that I am speaking with the correct person using two identifiers.  I discussed the limitations, risks, security and privacy concerns of performing an evaluation and management service by telemedicine and the availability of in-person appointments. I also discussed with the patient that there may be a patient responsible charge related to this service. The patient expressed understanding and agreed to proceed.  Other persons participating in the visit and their role in the encounter: Nichole.  Patient's location: home, [place] Provider's location: [facility] [place]  Reason for Visit: follow-up  Treatment History: Oncology History Overview Note  Final Diagnosis    1.  Lymph node, right axilla, excision:   -One lymph node, negative for carcinoma (0/1).   2. Sentinel lymph node, right axilla #1, excision:   -One lymph node, negative for carcinoma; multiple levels examined (0/1).   3. Sentinel lymph node, right axilla #2, excision:   -One lymph node, negative for carcinoma; multiple levels examined (0/1).   4. Sentinel lymph node, right axilla #3, excision:   -Two lymph nodes, negative for carcinoma; multiple levels examined (0/2).   5.  Lymph node, right axilla, excision:   -One lymph node, positive for carcinoma (1/1). -Metastatic focus measures greater than 3 mm. -No extranodal extension is seen.   6.  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 -Invasive focus measures 12 mm microscopically (measured on slide 6E). 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 -Margins are  negative for in situ and invasive carcinoma; closest is posterior at 7 mm and greater than 10 mm respectively. -Usual ductal hyperplasia, cystic apocrine metaplasia, fibroadenomatoid changes and biopsy site changes present. -pT1c (sn)N1a M-not applicable; AJCC stage IIA. -Please see synoptic report below.    # 04/20/2021: [Dr.Rao- ]Right breast invasive mammary carcinoma; declined neoadjuvant chemotherapy.  S/p mastectomy and SNL, pT1c(m) pN1a, ER low positive, PR negative and HER2 amplified-stage II.  Currently status post  adjuvant TCH-P x6 cycles [last 72/27]; Dr.Chrystal- [s/p  01/28/2022]. Phesgo  cycle 1 day 1   [01/18/2022]  # OCT 6th, 2023- Phesgo  every 3 weeks for total of 11 more cycles. [Total of 1 year].   # 07/13/2021: Status post FNA left lung nodule showing neoplastic cells consistent with low-grade neuroendocrine tumor [[facility]]. 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 Awaiting  PET/CT scan per Dr. Velda.   # PCOS/DM-sec to steroids- metformin    Malignant neoplasm of central portion of right breast in female, estrogen receptor negative (HCC)  05/11/2021 Cancer Staging   Staging form: Breast, AJCC 8th Edition - Pathologic stage from 05/11/2021: Stage IIA (pT1c, pN1a(sn), cM0, G3, ER-, PR-, HER2+) - Signed by Melanee Annah BROCKS, MD on 05/20/2021 Stage prefix: Initial diagnosis Method of lymph node assessment: Sentinel lymph node biopsy Multigene prognostic tests performed: None Histologic grading system: 3 grade system Residual tumor (R): R0 - None   05/20/2021 Initial Diagnosis   Malignant neoplasm of central portion of right breast in female, estrogen receptor negative (HCC)   08/07/2021 -  Chemotherapy   Patient is on Treatment Plan : BREAST DOCEtaxel + Trastuzumab + Pertuzumab  (THP) q21d x  8 cycles / Trastuzumab + Pertuzumab  q21d x 4 cycles      Patient was having menstrual cycles until her breast cancer diagnosis in  2022.  She describes these as very irregular, often skipping up to 3-6 months.  She denies any menstrual cycles since starting chemotherapy but had a week long episode of bleeding in October 2024 similar to a menses.  She denies any bleeding after that until her endometrial biopsy.  Pelvic ultrasound exam on 11/14 showed a uterus measuring 7.3 x 3.8 x 4.5 cm with an endometrium measuring 21 mm with scattered areas of internal vascularity.  Bilateral ovaries normal in appearance.   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  Since her biopsy, she notes bleeding that is sometimes spotting and at other times heavier bleeding.   05/28/23: Hysteroscopy, D&C (dilation and curettage), Mirena  IUD insertion. 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  MMRp, p53 WT.   05/31/23: MRI pelvis shows thickening of endometrium measuring up to 1.3 cm, prominent junctional zone but otherwise normal morphology and appearance of the uterus.  Mirena  IUD visualized.  No adenopathy or metastatic disease.   08/24/2023: Endometrial biopsy shows benign mixed pattern endometrium with marked progestin effect, benign endometrial polyp, no atypia, hyperplasia, or carcinoma.   12/12/2023: Endometrial biopsy shows low-grade endometrial carcinoma with progestin effect.  03/18/2024: Endometrial biopsy shows low-grade endometrial carcinoma with progestin effect.  Interval History: Doing well.  Past Medical/Surgical History: Past Medical History:  Diagnosis Date   Anemia 2023   Anxiety    Breast cancer (HCC)    Breast screening, unspecified 05/06/2010   Carcinoid tumor (HCC)    Diabetes mellitus without complication (HCC)    Obesity 05/06/2010   Other benign neoplasm of connective and other soft tissue of trunk, unspecified 05/06/2010    Past Surgical History:  Procedure Laterality Date   CHOLECYSTECTOMY      CYST EXCISION  05/06/2006   from back   HYSTEROSCOPY WITH D & C N/A 05/28/2023   Procedure: DILATATION AND CURETTAGE / POSSIBLE HYSTEROSCOPY;  Surgeon: Viktoria Comer SAUNDERS, MD;  Location: [facility];  Service: Gynecology;  Laterality: N/A;   INTRAUTERINE DEVICE (IUD) INSERTION N/A 05/28/2023   Procedure: INTRAUTERINE DEVICE (IUD) INSERTION MIRENA ;  Surgeon: Viktoria Comer SAUNDERS, MD;  Location: [facility];  Service: Gynecology;  Laterality: N/A;   LIPOMA EXCISION Left 09/04/2010   flank   LOBECTOMY Left 01/23/2023   LYMPH NODE BIOPSY Left 01/23/2023   Procedure: LYMPH NODE BIOPSY;  Surgeon: Kerrin Elspeth BROCKS, MD;  Location: [facility] OR;  Service: Thoracic;  Laterality: Left;   MASTECTOMY Right 2022   PORT-A-CATH REMOVAL Left 01/23/2023   Procedure: REMOVAL PORT-A-CATH;  Surgeon: Kerrin Elspeth BROCKS, MD;  Location: [facility] OR;  Service: Thoracic;  Laterality: Left;   TONSILLECTOMY      Family History  Problem Relation Age of Onset   Breast cancer Neg Hx     Social History   Socioeconomic History   Marital status: Married    Spouse name: Not on file   Number of children: 0   Years of education: Not on file   Highest education level: Not on file  Occupational History   Not on file  Tobacco Use   Smoking status: Never   Smokeless tobacco: Not on file  Vaping Use   Vaping status: Never Used  Substance and Sexual Activity   Alcohol use: Yes    Comment: ocassionally, 1-2 per month  Drug use: No   Sexual activity: Yes    Partners: Female    Birth control/protection: None  Other Topics Concern   Not on file  Social History Narrative   Not on file   Social Drivers of Health   Financial Resource Strain: Not on file  Food Insecurity: No Food Insecurity (01/23/2023)   Hunger Vital Sign    Worried About Running Out of Food in the Last Year: Never true    Ran Out of Food in the Last Year: Never true  Transportation Needs: No Transportation Needs (01/23/2023)   PRAPARE - Therapist, Art (Medical): No    Lack of Transportation (Non-Medical): No  Physical Activity: Not on file  Stress: Stress Concern Present (05/21/2021)   Received from [facility] - Occupational Stress Questionnaire    Feeling of Stress : To some extent  Social Connections: Not on file    Current Medications:  Current Outpatient Medications:    allopurinol  (ZYLOPRIM ) 100 MG tablet, TAKE 1 TABLET DAILY FOR    GOUT PREVENTION, Disp: 30 tablet, Rfl: 0   atorvastatin  (LIPITOR) 10 MG tablet, Take 1 tablet (10 mg total) by mouth every evening., Disp: 30 tablet, Rfl: 0   colchicine  0.6 MG tablet, Take 1 tablet (0.6 mg total) by mouth daily as needed (gout flares)., Disp: 30 tablet, Rfl: 2   metFORMIN  (GLUCOPHAGE -XR) 750 MG 24 hr tablet, Take 1 tablet (750 mg total) by mouth daily with breakfast., Disp: 30 tablet, Rfl: 0   Multiple Vitamins-Minerals (MULTIVITAMIN WITH MINERALS) tablet, Take 1 tablet by mouth daily., Disp: , Rfl:   Current Facility-Administered Medications:    lidocaine  (PF) (XYLOCAINE ) 1 % injection 10 mL, 10 mL, Intradermal, Once,   Facility-Administered Medications Ordered in Other Visits:    heparin  lock flush 100 UNIT/ML injection, , , ,   Review of Symptoms: Pertinent positives as per HPI.  Physical Exam: Deferred given limitations of phone visit.  Laboratory & Radiologic Studies: None new  Assessment & Plan: Roxie A Adger is a 53 y.o. woman with clinical Stage I low grade endometrioid who presents for phone visit.  Spoke with the patient and her.  Discussed details about surgery, many of which I had answered in MyChart message yesterday.  Would like to proceed with removal of her tubes and ovaries at the time of surgery.  We reviewed the plan for a robotic assisted hysterectomy, bilateral salpingo-oophorectomy, sentinel lymph node evaluation, possible lymph node dissection, possible laparotomy. The risks of  surgery were discussed in detail and she understands these to include infection; wound separation; hernia; vaginal cuff separation, injury to adjacent organs such as bowel, bladder, blood vessels, ureters and nerves; bleeding which may require blood transfusion; anesthesia risk; thromboembolic events; possible death; unforeseen complications; possible need for re-exploration; medical complications such as heart attack, stroke, pleural effusion and pneumonia; and, if full lymphadenectomy is performed the risk of lymphedema and lymphocyst. The patient will receive DVT and antibiotic prophylaxis as indicated. She voiced a clear understanding. She had the opportunity to ask questions. Perioperative instructions were reviewed with her. Prescriptions for post-op medications were sent to her pharmacy of choice.  I discussed the assessment and treatment plan with the patient. The patient was provided with an opportunity to ask questions and all were answered. The patient agreed with the plan and demonstrated an understanding of the instructions.   The patient was advised to call back or see  an in-person evaluation if the symptoms worsen or if the condition fails to improve as anticipated.   20 minutes of total time was spent for this patient encounter, including preparation, phone counseling with the patient and coordination of care, and documentation of the encounter.   Comer Dollar, MD  Division of Gynecologic Oncology  Department of Obstetrics and Gynecology  [facility]

## 2024-04-02 ENCOUNTER — Telehealth: Payer: Self-pay | Admitting: *Deleted

## 2024-04-02 NOTE — Telephone Encounter (Signed)
 Per Dr Viktoria faxa surgical optimization form to the following offices   Dr Sisto Axe NP (med onc) [phone] Dr Kerrin (cardio thoracic) [phone] Mardy Dyke, PA (PCP) [phone]

## 2024-04-05 ENCOUNTER — Telehealth: Payer: Self-pay | Admitting: Physician Assistant

## 2024-04-05 NOTE — Telephone Encounter (Signed)
 Received surgical optimization form from Dr Kerrin office (cardio thoracic) it stated dr Kerrin does not do surgical clearance, please refer to PCP or cardiologist for clearance.   Message sent rto Allison Harper, APP

## 2024-04-05 NOTE — Telephone Encounter (Signed)
 [facility] gyne[facility] oncology faxed  Surgical Clearance, to be filled out by provider. Patient requested to send it back via Fax within ASAP. Document is located in providers tray at front office.Please advise at [phone].

## 2024-04-06 ENCOUNTER — Telehealth: Payer: Self-pay

## 2024-04-06 NOTE — Telephone Encounter (Signed)
 Patient not seen in office by PCP since 03/12/23. Please call patient to schedule an appointment ASAP  she is scheduled for a procedure 04/28/24 and surgical clearance form cannot be completed without an appointment.

## 2024-04-06 NOTE — Telephone Encounter (Signed)
 Copied from CRM [id number]. Topic: Clinical - Medical Advice >> Apr 06, 2024  2:30 PM Kevelyn M wrote: Reason for CRM: Patient is calling back for [facility] Patient is declining an appt for surgical clearance appointment at this time. She going to ask if the surgeon can just do the surgery with out a Surgical Clearance with her primary provider. She's been seeing her oncologist regularly. Surgery is gonna be scheduled for 04/21/2024. She will call back if she still needs the appointment.

## 2024-04-06 NOTE — Telephone Encounter (Signed)
 LVM to schedule Surgical Clearance before 04/28/24. Please transfer to [facility] if any problems scheduling prior to 04/28/24

## 2024-04-07 NOTE — Telephone Encounter (Signed)
Please see pt call msg as FYI

## 2024-04-07 NOTE — Telephone Encounter (Signed)
 Noted and agreed, thank you.

## 2024-04-09 ENCOUNTER — Telehealth: Payer: Self-pay | Admitting: *Deleted

## 2024-04-09 NOTE — Telephone Encounter (Signed)
 Spoke with Allison Harper and relayed message that her surgery date has been  moved up to December 18 th. Pt agreed and was thankful for the call.

## 2024-04-14 NOTE — Telephone Encounter (Signed)
 Refax clearance from to med onc

## 2024-04-16 NOTE — Patient Instructions (Signed)
 SURGICAL WAITING ROOM VISITATION Patients having surgery or a procedure may have no more than 2 support people in the waiting area - these visitors may rotate in the visitor waiting room.   Due to an increase in RSV and influenza rates and associated hospitalizations, children ages 87 and under may not visit patients in [facility] hospitals. If the patient needs to stay at the hospital during part of their recovery, the visitor guidelines for inpatient rooms apply.  PRE-OP VISITATION  Pre-op nurse will coordinate an appropriate time for 1 support person to accompany the patient in pre-op.  This support person may not rotate.  This visitor will be contacted when the time is appropriate for the visitor to come back in the pre-op area.  Please refer to the [facility] website for the visitor guidelines for Inpatients (after your surgery is over and you are in a regular room).  You are not required to quarantine at this time prior to your surgery. However, you must do this: Hand Hygiene often Do NOT share personal items Notify your provider if you are in close contact with someone who has COVID or you develop fever 100.4 or greater, new onset of sneezing, cough, sore throat, shortness of breath or body aches.  If you test positive for Covid or have been in contact with anyone that has tested positive in the last 10 days please notify you surgeon.    Your procedure is scheduled on:  04/22/24  Report to [facility] Main Entrance: [place] entrance where the [employer] is available.   Report to admitting at: 8:15 AM  Call this number if you have any questions or problems the morning of surgery [phone]  FOLLOW ANY ADDITIONAL PRE OP INSTRUCTIONS YOU RECEIVED FROM YOUR SURGEON'S OFFICE!!!  Eat a light diet the day before surgery.  Examples including soups, broths, toast, yogurt, mashed potatoes.  Things to avoid include carbonated beverages (fizzy beverages), raw fruits and raw  vegetables, or beans.   If your bowels are filled with gas, your surgeon will have difficulty visualizing your pelvic organs which increases your surgical risks.  Do not eat food after Midnight the night prior to your surgery/procedure.  After Midnight you may have the following liquids until: 7:30 AM  DAY OF SURGERY  Clear Liquid Diet Water Black Coffee (sugar ok, NO MILK/CREAM OR CREAMERS)  Tea (sugar ok, NO MILK/CREAM OR CREAMERS) regular and decaf                             Plain Jell-O  with no fruit (NO RED)                                           Fruit ices (not with fruit pulp, NO RED)                                     Popsicles (NO RED)  Juice: NO CITRUS JUICES: only apple, WHITE grape, WHITE cranberry Sports drinks like Gatorade or Powerade (NO RED)   Oral Hygiene is also important to reduce your risk of infection.        Remember - BRUSH YOUR TEETH THE MORNING OF SURGERY WITH YOUR REGULAR TOOTHPASTE  Do NOT smoke after Midnight the night before surgery.  STOP TAKING all Vitamins, Herbs and supplements 1 week before your surgery.   Take ONLY these medicines the morning of surgery with A SIP OF WATER: NONE. Colchicine  as needed.                   You may not have any metal on your body including hair pins, jewelry, and body piercing  Do not wear make-up, lotions, powders, perfumes / cologne, or deodorant  Do not wear nail polish including gel and S&S, artificial / acrylic nails, or any other type of covering on natural nails including finger and toenails. If you have artificial nails, gel coating, etc., that needs to be removed by a nail salon, Please have this removed prior to surgery. Not doing so may mean that your surgery could be cancelled or delayed if the Surgeon or anesthesia staff feels like they are unable to monitor you safely.   Do not shave 48 hours prior to surgery to avoid nicks in your skin  which may contribute to postoperative infections.   Contacts, Hearing Aids, dentures or bridgework may not be worn into surgery. DENTURES WILL BE REMOVED PRIOR TO SURGERY PLEASE DO NOT APPLY Poly grip OR ADHESIVES!!!  You may bring a small overnight bag with you on the day of surgery, only pack items that are not valuable. [place] IS NOT RESPONSIBLE   FOR VALUABLES THAT ARE LOST OR STOLEN.   Patients discharged on the day of surgery will not be allowed to drive home.  Someone NEEDS to stay with you for the first 24 hours after anesthesia.  Do not bring your home medications to the hospital. The Pharmacy will dispense medications listed on your medication list to you during your admission in the Hospital.  Special Instructions: Bring a copy of your healthcare power of attorney and living will documents the day of surgery, if you wish to have them scanned into your [facility] Records- EPIC  Please read over the following fact sheets you were given: IF YOU HAVE QUESTIONS ABOUT YOUR PRE-OP INSTRUCTIONS, PLEASE CALL [phone]   [facility] - Preparing for Surgery      Before surgery, you can play an important role.  Because skin is not sterile, your skin needs to be as free of germs as possible.  You can reduce the number of germs on your skin by washing with CHG (chlorahexidine gluconate) soap before surgery.  CHG is an antiseptic cleaner which kills germs and bonds with the skin to continue killing germs even after washing. Please DO NOT use if you have an allergy to CHG or antibacterial soaps.  If your skin becomes reddened/irritated stop using the CHG and inform your nurse when you arrive at Short Stay. Do not shave (including legs and underarms) for at least 48 hours prior to the first CHG shower.  You may shave your face/neck.  Please follow these instructions carefully:  1.  Shower with CHG Soap the night before surgery ONLY (DO NOT USE THE SOAP THE MORNING OF SURGERY).  2.   If you choose to wash your hair, wash your hair first as usual  with your normal  shampoo.  3.  After you shampoo, rinse your hair and body thoroughly to remove the shampoo.                             4.  Use CHG as you would any other liquid soap.  You can apply chg directly to the skin and wash.  Gently with a scrungie or clean washcloth.  5.  Apply the CHG Soap to your body ONLY FROM THE NECK DOWN.   Do not use on face/ open                           Wound or open sores. Avoid contact with eyes, ears mouth and genitals (private parts).                       Wash face,  Genitals (private parts) with your normal soap.             6.  Wash thoroughly, paying special attention to the area where your  surgery  will be performed.  7.  Thoroughly rinse your body with warm water from the neck down.  8.  DO NOT shower/wash with your normal soap after using and rinsing off the CHG Soap.                9.  Pat yourself dry with a clean towel.            10.  Wear clean pajamas.            11.  Place clean sheets on your bed the night of your first shower and do not  sleep with pets.  Day of Surgery : Do not apply any CHG, lotions/deodorants the morning of surgery.  Please wear clean clothes to the hospital/surgery center.   FAILURE TO FOLLOW THESE INSTRUCTIONS MAY RESULT IN THE CANCELLATION OF YOUR SURGERY  PATIENT SIGNATURE_________________________________  NURSE SIGNATURE__________________________________  ________________________________________________________________________ WHAT IS A BLOOD TRANSFUSION? Blood Transfusion Information  A transfusion is the replacement of blood or some of its parts. Blood is made up of multiple cells which provide different functions. Red blood cells carry oxygen and are used for blood loss replacement. White blood cells fight against infection. Platelets control bleeding. Plasma helps clot blood. Other blood products are available for specialized needs,  such as hemophilia or other clotting disorders. BEFORE THE TRANSFUSION  Who gives blood for transfusions?  Healthy volunteers who are fully evaluated to make sure their blood is safe. This is blood bank blood. Transfusion therapy is the safest it has ever been in the practice of medicine. Before blood is taken from a donor, a complete history is taken to make sure that person has no history of diseases nor engages in risky social behavior (examples are intravenous drug use or sexual activity with multiple partners). The donor's travel history is screened to minimize risk of transmitting infections, such as malaria. The donated blood is tested for signs of infectious diseases, such as HIV and hepatitis. The blood is then tested to be sure it is compatible with you in order to minimize the chance of a transfusion reaction. If you or a relative donates blood, this is often done in anticipation of surgery and is not appropriate for emergency situations. It takes many days to process the donated blood. RISKS AND COMPLICATIONS Although transfusion therapy is  very safe and saves many lives, the main dangers of transfusion include:  Getting an infectious disease. Developing a transfusion reaction. This is an allergic reaction to something in the blood you were given. Every precaution is taken to prevent this. The decision to have a blood transfusion has been considered carefully by your caregiver before blood is given. Blood is not given unless the benefits outweigh the risks. AFTER THE TRANSFUSION Right after receiving a blood transfusion, you will usually feel much better and more energetic. This is especially true if your red blood cells have gotten low (anemic). The transfusion raises the level of the red blood cells which carry oxygen, and this usually causes an energy increase. The nurse administering the transfusion will monitor you carefully for complications. HOME CARE INSTRUCTIONS  No special  instructions are needed after a transfusion. You may find your energy is better. Speak with your caregiver about any limitations on activity for underlying diseases you may have. SEEK MEDICAL CARE IF:  Your condition is not improving after your transfusion. You develop redness or irritation at the intravenous (IV) site. SEEK IMMEDIATE MEDICAL CARE IF:  Any of the following symptoms occur over the next 12 hours: Shaking chills. You have a temperature by mouth above 102 F (38.9 C), not controlled by medicine. Chest, back, or muscle pain. People around you feel you are not acting correctly or are confused. Shortness of breath or difficulty breathing. Dizziness and fainting. You get a rash or develop hives. You have a decrease in urine output. Your urine turns a dark color or changes to pink, red, or brown. Any of the following symptoms occur over the next 10 days: You have a temperature by mouth above 102 F (38.9 C), not controlled by medicine. Shortness of breath. Weakness after normal activity. The white part of the eye turns yellow (jaundice). You have a decrease in the amount of urine or are urinating less often. Your urine turns a dark color or changes to pink, red, or brown. Document Released: 04/19/2000 Document Revised: 07/15/2011 Document Reviewed: 12/07/2007 [facility] Patient Information 2014 [place], [place].  _______________________________________________________________________

## 2024-04-19 ENCOUNTER — Ambulatory Visit: Payer: Self-pay | Admitting: Gynecologic Oncology

## 2024-04-19 ENCOUNTER — Encounter (HOSPITAL_COMMUNITY)
Admission: RE | Admit: 2024-04-19 | Discharge: 2024-04-19 | Disposition: A | Discharge status: Home / Self Care | Source: Ambulatory Visit | Attending: Gynecologic Oncology | Admitting: Gynecologic Oncology

## 2024-04-19 ENCOUNTER — Telehealth: Payer: Self-pay | Admitting: *Deleted

## 2024-04-19 ENCOUNTER — Other Ambulatory Visit: Payer: Self-pay

## 2024-04-19 ENCOUNTER — Encounter (HOSPITAL_COMMUNITY): Payer: Self-pay

## 2024-04-19 VITALS — BP 147/70 | HR 62 | Temp 97.0°F | Ht 69.0 in | Wt 250.0 lb

## 2024-04-19 DIAGNOSIS — Z01818 Encounter for other preprocedural examination: Secondary | ICD-10-CM | POA: Diagnosis present

## 2024-04-19 DIAGNOSIS — C541 Malignant neoplasm of endometrium: Secondary | ICD-10-CM | POA: Diagnosis not present

## 2024-04-19 DIAGNOSIS — E1165 Type 2 diabetes mellitus with hyperglycemia: Secondary | ICD-10-CM

## 2024-04-19 LAB — HEMOGLOBIN A1C
Hgb A1c MFr Bld: 9.6 % — ABNORMAL HIGH (ref 4.8–5.6)
Mean Plasma Glucose: 228.82 mg/dL

## 2024-04-19 LAB — COMPREHENSIVE METABOLIC PANEL WITH GFR
ALT: 13 U/L (ref 0–44)
AST: 24 U/L (ref 15–41)
Albumin: 4.5 g/dL (ref 3.5–5.0)
Alkaline Phosphatase: 84 U/L (ref 38–126)
Anion gap: 12 (ref 5–15)
BUN: 11 mg/dL (ref 6–20)
CO2: 24 mmol/L (ref 22–32)
Calcium: 9.9 mg/dL (ref 8.9–10.3)
Chloride: 100 mmol/L (ref 98–111)
Creatinine, Ser: 0.75 mg/dL (ref 0.44–1.00)
GFR, Estimated: >60 mL/min (ref >60)
Glucose, Bld: 210 mg/dL — ABNORMAL HIGH (ref 70–99)
Potassium: 4.7 mmol/L (ref 3.5–5.1)
Sodium: 136 mmol/L (ref 135–145)
Total Bilirubin: 0.5 mg/dL (ref 0.0–1.2)
Total Protein: 7.8 g/dL (ref 6.5–8.1)

## 2024-04-19 LAB — CBC
HCT: 42.5 % (ref 36.0–46.0)
Hemoglobin: 13.1 g/dL (ref 12.0–15.0)
MCH: 28.2 pg (ref 26.0–34.0)
MCHC: 30.8 g/dL (ref 30.0–36.0)
MCV: 91.4 fL (ref 80.0–100.0)
Platelets: 193 K/uL (ref 150–400)
RBC: 4.65 MIL/uL (ref 3.87–5.11)
RDW: 13.4 % (ref 11.5–15.5)
WBC: 7.8 K/uL (ref 4.0–10.5)
nRBC: 0 % (ref 0.0–0.2)

## 2024-04-19 LAB — GLUCOSE, CAPILLARY: Glucose-Capillary: 223 mg/dL — ABNORMAL HIGH (ref 70–99)

## 2024-04-19 NOTE — Progress Notes (Signed)
 Lab.results: A1C: 9.6

## 2024-04-19 NOTE — Progress Notes (Signed)
 For Anesthesia: PCP - Allwardt, Mardy HERO, PA-C  Cardiologist - N/A  Bowel Prep reminder:  Chest x-ray - 12/30/23 EKG - 04/19/24 Stress Test -  ECHO - 07/17/22 Cardiac Cath -  Pacemaker/ICD device last checked: Pacemaker orders received: Device Rep notified:  Spinal Cord Stimulator:N/A  Sleep Study - N/A CPAP -   Fasting Blood Sugar - N/A Checks Blood Sugar __0___ times a day Date and result of last Hgb A1c-  Last dose of GLP1 agonist- N/A GLP1 instructions: Hold 7 days prior to schedule (Hold 24 hours-daily)   Last dose of SGLT-2 inhibitors- N/A SGLT-2 instructions: Hold 72 hours prior to surgery  Blood Thinner Instructions:N/A Last Dose: Time last taken:  Aspirin Instructions: Last Dose: Time last taken:  Activity level: Can go up a flight of stairs and activities of daily living without stopping and without chest pain and/or shortness of breath   Able to exercise without chest pain and/or shortness of breath  Anesthesia review: Hx: DIA  Patient denies shortness of breath, fever, cough and chest pain at PAT appointment   Patient verbalized understanding of instructions that were reviewed over the telephone.

## 2024-04-19 NOTE — Telephone Encounter (Signed)
 Received a call from Dr Velda office (med once) per Cherlyn RN they will not clear the patient for surgery . Per Lorie Patient has not been seen since 2023 and the patient fired us  has her provider.

## 2024-04-19 NOTE — Telephone Encounter (Signed)
-----   Message from Eleanor Epps, NP sent at 04/19/2024 12:58 PM EST ----- Please let the patient know her Hgb A1C is too high (at 9.6) to proceed with surgery safely. Dr. Viktoria is recommending she get in with her PCP to address and treat the uncontrolled diabetes. Surgery will be placed on hold until her A1C improves.

## 2024-04-19 NOTE — Telephone Encounter (Signed)
 Spoke with Allison Harper and relayed message from Eleanor Epps, NP that patient's Hbg A1C is too high (at 9.6) to proceed with surgery safely. Dr. Viktoria is recommending she get in with her PCP to address and treat the uncontrolled diabetes. Surgery will be placed on hold until her A1C improves.  Pt verbalized understanding and states will call her primary care provider.

## 2024-04-20 ENCOUNTER — Ambulatory Visit: Admitting: Physician Assistant

## 2024-04-20 ENCOUNTER — Encounter: Payer: Self-pay | Admitting: Physician Assistant

## 2024-04-20 ENCOUNTER — Telehealth: Payer: Self-pay | Admitting: Physician Assistant

## 2024-04-20 VITALS — BP 132/68 | HR 69 | Temp 97.2°F | Ht 69.0 in | Wt 254.6 lb

## 2024-04-20 DIAGNOSIS — E1165 Type 2 diabetes mellitus with hyperglycemia: Secondary | ICD-10-CM | POA: Diagnosis not present

## 2024-04-20 DIAGNOSIS — C541 Malignant neoplasm of endometrium: Secondary | ICD-10-CM | POA: Insufficient documentation

## 2024-04-20 DIAGNOSIS — R011 Cardiac murmur, unspecified: Secondary | ICD-10-CM

## 2024-04-20 DIAGNOSIS — Z7985 Long-term (current) use of injectable non-insulin antidiabetic drugs: Secondary | ICD-10-CM

## 2024-04-20 MED ORDER — TIRZEPATIDE 2.5 MG/0.5ML ~~LOC~~ SOAJ: 2.5000 mg | SUBCUTANEOUS | 1 refills | Status: DC

## 2024-04-20 NOTE — Telephone Encounter (Signed)
 LVM to clarify sugar levels and symptoms

## 2024-04-20 NOTE — Patient Instructions (Signed)
°  VISIT SUMMARY: You came in today for a follow-up on managing your high blood sugar levels. We discussed your type 2 diabetes, which is currently not well controlled, and the postponement of your hysterectomy due to elevated A1c levels. We also talked about your endometrial cancer and a cardiac murmur noted during your exam.  YOUR PLAN: TYPE 2 DIABETES MELLITUS WITH HYPERGLYCEMIA: Your blood sugar levels are high, with an A1c of 9.6% and a glucose level of 210 mg/dL. This has delayed your scheduled surgery for endometrial cancer. -Start taking Mounjaro  (tirzepatide ) with a starting dose of 2.5 mg weekly. We may increase this to 5 mg if you tolerate it well. -Continue taking metformin  XR 750 mg daily. -Make dietary changes: reduce portion sizes of carbohydrates, increase lean protein intake, and avoid sugary drinks. -Increase physical activity, such as walking. -Be aware of potential side effects of Mounjaro , including nausea and constipation. Use Miralax if needed. -Schedule a follow-up appointment in 8 weeks to check your blood sugar levels and see how you are tolerating the medication.  ENDOMETRIAL CANCER: You have endometrial cancer that initially responded to hormonal treatment but now requires surgery, which has been postponed due to your high A1c levels. -We will continue to work with your oncologist to monitor your cancer and plan for your hysterectomy once your blood sugar levels improve.  CARDIAC MURMUR: A heart murmur was noted during your exam, but it is not causing any symptoms and does not require immediate treatment. -We will continue to monitor your heart murmur, but no immediate action is needed.                      Contains text generated by Abridge.                                 Contains text generated by Abridge.

## 2024-04-20 NOTE — Progress Notes (Signed)
 Patient ID: Allison Harper, female    DOB: 1970/09/21, 53 y.o.   MRN: [id number]   Assessment & Plan:  Type 2 diabetes mellitus with hyperglycemia, without long-term current use of insulin  (HCC) -     Tirzepatide ; Inject 2.5 mg into the skin once a week.  Dispense: 2 mL; Refill: 1  Endometrial cancer (HCC)  Cardiac murmur    Assessment & Plan Type 2 diabetes mellitus with hyperglycemia Type 2 diabetes with poor glycemic control, evidenced by an A1c of 9.6% and recent glucose level of 210 mg/dL. Current treatment includes metformin  XR 750 mg daily. Surgery for endometrial cancer is postponed due to elevated A1c. GLP-1 receptor agonist therapy is considered to improve glycemic control and facilitate surgical clearance. - Initiated Mounjaro  (tirzepatide ) with a sample provided today. - Prescribed Mounjaro  with a starting dose of 2.5 mg weekly, with potential to increase to 5 mg if tolerated. - - Patient denies personal or family history of multiple endocrine neoplasia type 2, medullary thyroid cancer; personal history of pancreatitis or gallbladder disease.  - Advised on dietary modifications: reduce portion sizes of carbohydrates, increase lean protein intake, and avoid sugary beverages. - Encouraged physical activity, such as walking. - Discussed potential side effects of Mounjaro , including nausea and constipation, and recommended Miralax if needed. - Scheduled follow-up in 8 weeks to assess glycemic control and medication tolerance. Lab Results  Component Value Date   HGBA1C 9.6 (H) 04/19/2024   HGBA1C 8.1 (H) 03/19/2023   HGBA1C 9.6 (H) 01/25/2023     Endometrial cancer Diagnosed with endometrial cancer, initially treated with hormonal therapy, which became ineffective. Hysterectomy was planned but postponed due to poor glycemic control. Surgery is contingent on improving A1c levels. - Continue to coordinate with oncologist to monitor cancer status and plan for future  hysterectomy once glycemic control is achieved.  Cardiac murmur Noted on examination, with previous echocardiogram in March of last year showing no significant abnormalities. Murmur is not currently causing symptoms or requiring intervention. - Continue to monitor cardiac status and murmur, no immediate intervention required.      Return in about 8 weeks (around 06/15/2024) for recheck/follow-up.    Subjective:    Chief Complaint  Patient presents with   Blood Sugar Problem    Having issues with blood sugars. Halloween time was bad but Thanksgiving was good. Also didn't do well over the weekend while drinking cider. Ever since she had been on chemo blood/ steroids  sugars have been high. Her dog tells her when she is high.   Was on metformin  for PCOS and maybe that is why blood sugars were never a problem in the past.   Hysterotomy canceled because of A1C level.     HPI Discussed the use of AI scribe software for clinical note transcription with the patient, who gave verbal consent to proceed.  History of Present Illness Allison Harper is a 53 year old female with type 2 diabetes and endometrial cancer who presents for follow-up on hyperglycemia management. Here with her wife.  She is experiencing hyperglycemia with a recent A1c of 9.6% and a glucose level of 210 mg/dL. She is currently taking metformin  XR 750 mg daily and is not on insulin . Her scheduled hysterectomy for endometrial cancer was postponed after her A1c was found to be elevated.  She was diagnosed with endometrial cancer and initially responded to hormonal treatment with progesterone, which was effective temporarily. However, her cancer became resistant, necessitating a hysterectomy. The surgery  was postponed due to her elevated A1c. She feels overwhelmed by the situation, especially with the upcoming surgery and the need to manage her diabetes more effectively.  She acknowledges dietary lapses, particularly after  Halloween, and leads a sedentary lifestyle, sitting at her desk most of the day. She drinks carbonated water and avoids sugary drinks like [facility].  She has a history of gallbladder removal. No history of medullary thyroid cancer, pancreatitis, or gallbladder disease. There is no known family history of multiple endocrine neoplasia type 2.  She feels overwhelmed by the demands of her work and the upcoming surgery. She is processing her chronic illness with the help of a child psychotherapist.     Past Medical History:  Diagnosis Date   Anemia 2023   Anxiety    Breast cancer (HCC)    Breast screening, unspecified 05/06/2010   Carcinoid tumor (HCC)    Diabetes mellitus without complication (HCC)    Obesity 05/06/2010   Other benign neoplasm of connective and other soft tissue of trunk, unspecified 05/06/2010    Past Surgical History:  Procedure Laterality Date   CHOLECYSTECTOMY     CYST EXCISION  05/06/2006   from back   HYSTEROSCOPY WITH D & C N/A 05/28/2023   Procedure: DILATATION AND CURETTAGE / POSSIBLE HYSTEROSCOPY;  Surgeon: Viktoria Comer SAUNDERS, MD;  Location: [facility] ORS;  Service: Gynecology;  Laterality: N/A;   INTRAUTERINE DEVICE (IUD) INSERTION N/A 05/28/2023   Procedure: INTRAUTERINE DEVICE (IUD) INSERTION MIRENA ;  Surgeon: Viktoria Comer SAUNDERS, MD;  Location: [facility];  Service: Gynecology;  Laterality: N/A;   LIPOMA EXCISION Left 09/04/2010   flank   LOBECTOMY Left 01/23/2023   LYMPH NODE BIOPSY Left 01/23/2023   Procedure: LYMPH NODE BIOPSY;  Surgeon: Kerrin Elspeth BROCKS, MD;  Location: [facility] OR;  Service: Thoracic;  Laterality: Left;   MASTECTOMY Right 2022   PORT-A-CATH REMOVAL Left 01/23/2023   Procedure: REMOVAL PORT-A-CATH;  Surgeon: Kerrin Elspeth BROCKS, MD;  Location: [facility] OR;  Service: Thoracic;  Laterality: Left;   TONSILLECTOMY      Family History  Problem Relation Age of Onset   Breast cancer Neg Hx     Social History[1]   Allergies[2]  Review of  Systems NEGATIVE UNLESS OTHERWISE INDICATED IN HPI      Objective:     BP 132/68 (BP Location: Left Arm, Patient Position: Sitting, Cuff Size: Large)   Pulse 69   Temp (!) 97.2 F (36.2 C) (Temporal)   Ht 5' 9 (1.753 m)   Wt 254 lb 9.6 oz (115.5 kg)   LMP  (LMP Unknown)   SpO2 97%   BMI 37.60 kg/m   Wt Readings from Last 3 Encounters:  04/20/24 254 lb 9.6 oz (115.5 kg)  04/19/24 250 lb (113.4 kg)  03/18/24 250 lb 9.6 oz (113.7 kg)    BP Readings from Last 3 Encounters:  04/20/24 132/68  04/19/24 (!) 147/70  03/18/24 139/74     Physical Exam Vitals and nursing note reviewed.  Constitutional:      Appearance: Normal appearance. She is obese. She is not toxic-appearing.  HENT:     Head: Normocephalic and atraumatic.  Eyes:     Extraocular Movements: Extraocular movements intact.     Conjunctiva/sclera: Conjunctivae normal.     Pupils: Pupils are equal, round, and reactive to light.  Neck:     Thyroid: No thyroid mass, thyromegaly or thyroid tenderness.  Cardiovascular:     Rate and Rhythm: Normal rate and regular rhythm.  Pulses: Normal pulses.     Heart sounds: Murmur (2/6) heard.  Pulmonary:     Effort: Pulmonary effort is normal.     Breath sounds: Normal breath sounds.  Chest:     Chest wall: No mass.  Breasts:    Right: Absent. No mass or tenderness.     Left: Normal.  Genitourinary:    General: Normal vulva.     Labia:        Right: No rash, tenderness or lesion.        Left: No rash, tenderness or lesion.      Vagina: Normal.     Cervix: Normal.     Uterus: Normal.      Adnexa: Right adnexa normal and left adnexa normal.  Musculoskeletal:     Cervical back: Normal range of motion and neck supple.  Lymphadenopathy:     Cervical: No cervical adenopathy.     Upper Body:     Right upper body: No supraclavicular, axillary or pectoral adenopathy.     Left upper body: No supraclavicular, axillary or pectoral adenopathy.     Lower Body: No right  inguinal adenopathy. No left inguinal adenopathy.  Skin:    General: Skin is warm and dry.     Findings: No lesion.  Neurological:     General: No focal deficit present.     Mental Status: She is alert and oriented to person, place, and time.  Psychiatric:        Mood and Affect: Mood normal.        Behavior: Behavior normal.        Thought Content: Thought content normal.        Judgment: Judgment normal.             Dandrae Kustra M Tanai Bouler, PA-C     [1]  Social History Tobacco Use   Smoking status: Never  Vaping Use   Vaping status: Never Used  Substance Use Topics   Alcohol use: Yes    Comment: ocassionally, 1-2 per month   Drug use: No  [2]  Allergies Allergen Reactions   Copper  Cu 64 Dotatate Diarrhea, Nausea Only, Rash and Other (See Comments)    Chest pain   Latex     Patient prefers to avoid latex

## 2024-04-22 ENCOUNTER — Encounter (HOSPITAL_COMMUNITY): Admission: RE | Payer: Self-pay | Source: Home / Self Care

## 2024-04-22 ENCOUNTER — Ambulatory Visit (HOSPITAL_COMMUNITY): Admission: RE | Admit: 2024-04-22 | Source: Home / Self Care | Admitting: Gynecologic Oncology

## 2024-04-22 DIAGNOSIS — C541 Malignant neoplasm of endometrium: Secondary | ICD-10-CM

## 2024-04-22 LAB — TYPE AND SCREEN
ABO/RH(D): AB POS
Antibody Screen: NEGATIVE

## 2024-04-28 ENCOUNTER — Encounter (HOSPITAL_COMMUNITY): Payer: Self-pay | Admitting: Physician Assistant

## 2024-04-28 DIAGNOSIS — C541 Malignant neoplasm of endometrium: Secondary | ICD-10-CM

## 2024-04-28 SURGERY — HYSTERECTOMY, TOTAL, ROBOT-ASSISTED, LAPAROSCOPIC, WITH BILATERAL SALPINGO-OOPHORECTOMY
Anesthesia: General

## 2024-04-30 ENCOUNTER — Encounter: Payer: Self-pay | Admitting: Internal Medicine

## 2024-05-03 ENCOUNTER — Ambulatory Visit: Payer: Self-pay

## 2024-05-03 NOTE — Telephone Encounter (Signed)
 FYI Only or Action Required?: Action required by provider: clinical question for provider.  Patient was last seen in primary care on 04/20/2024 by Allwardt, Mardy HERO, PA-C.  Called Nurse Triage reporting Medication Question.  Symptoms began n/a.  Interventions attempted: Other: n/a.  Symptoms are: n/a.  Triage Disposition: Call PCP When Office is Open  Patient/caregiver understands and will follow disposition?: Yes Reason for Disposition  [1] Caller has NON-URGENT medicine question about med that PCP prescribed AND [2] triager unable to answer question  Answer Assessment - Initial Assessment Questions Patient's spouse calling in, they are out of town and brought 1 monjaro with them, could not get injector to work. Patient will be taking dose tomorrow. She also is wondering what can be done about the pen that was not able to be used as they are expensive and she would like a replacement, she stated she will come by the office to show what happened with the current injector. Please advise.  1. NAME of MEDICINE: What medicine(s) are you calling about?     tirzepatide  (MOUNJARO ) 2.5 MG/0.5ML   2. QUESTION: What is your question? (e.g., double dose of medicine, side effect)     Pulled the gray cap off on the bottom to twist and unlock, and the entire needle came out and started squirting medication everywhere and could not get injector to work.  3. PRESCRIBER: Who prescribed the medicine? Reason: if prescribed by specialist, call should be referred to that group.     Alyssa Allwardt  4. SYMPTOMS: Do you have any symptoms? If Yes, ask: What symptoms are you having?  How bad are the symptoms (e.g., mild, moderate, severe)     Denies  Protocols used: Medication Question Call-A-AH  Copied from CRM #[id number]. Topic: Clinical - Medical Advice >> May 03, 2024  2:08 PM Deaijah H wrote: Reason for CRM: Patients spouse called in due to tirzepatide  (MOUNJARO ) 2.5 MG/0.5ML Pen Top  unlocked but pulling the bottom and whole inside was trying to come out.. Needing medical advice

## 2024-05-13 ENCOUNTER — Encounter: Payer: Self-pay | Admitting: Physician Assistant

## 2024-05-13 ENCOUNTER — Telehealth: Payer: Self-pay

## 2024-05-13 NOTE — Telephone Encounter (Signed)
 Noted and agreed, thank you.

## 2024-05-13 NOTE — Telephone Encounter (Signed)
 Please see patient message and advise. Per notes in chart on 12/29 pt was advised to contact the pharmacy, however when spouse came in office today she was advised the manufacturer would need to be contacted and they would need to replace. Patient prescription sent to pharmacy 04/23/24 for fill.

## 2024-05-13 NOTE — Telephone Encounter (Signed)
 Patient spouse came in office in regards to sample received in the office and having a defective pen. Patient and spouse has been trying to see how to get pen replaced and has been told by pharmacy to bring defective pen into office and office staff advised to contact pharmacy to replacement. Spoke with [employer] (listed on [employer]) and advised since this was a sample we are happy it was brought back to the office so this can be reported to the manufacture and our representative from [employer], however we do not have a sample pen to provide. Advised I would contact our [employer] representative to ensure it is documented. Spoke with [employer] regarding patient prescription to ensure it was indeed sent to correct pharmacy and was advised pharmacy correct, and nothing further needed at this time.

## 2024-05-14 ENCOUNTER — Other Ambulatory Visit: Payer: Self-pay | Admitting: Physician Assistant

## 2024-05-14 DIAGNOSIS — E1165 Type 2 diabetes mellitus with hyperglycemia: Secondary | ICD-10-CM

## 2024-05-14 MED ORDER — MOUNJARO 5 MG/0.5ML ~~LOC~~ SOAJ: 5.0000 mg | SUBCUTANEOUS | 0 refills | Status: AC

## 2024-05-14 NOTE — Telephone Encounter (Signed)
 See patient response and advise on dose, no refill requests or fax has been received for patient for prescription

## 2024-05-18 ENCOUNTER — Encounter: Payer: Self-pay | Admitting: Physician Assistant

## 2024-05-21 ENCOUNTER — Telehealth: Payer: Self-pay | Admitting: *Deleted

## 2024-05-21 NOTE — Telephone Encounter (Signed)
 Attempted to reach patient to relay message from provider. Left voicemail requesting call back to [phone].

## 2024-05-25 NOTE — Telephone Encounter (Signed)
 2nd attempt to reach patient to relay message from provider. Left message requesting call back to [phone].

## 2024-05-26 ENCOUNTER — Encounter: Payer: Self-pay | Admitting: Gynecologic Oncology

## 2024-05-26 ENCOUNTER — Encounter: Payer: Self-pay | Admitting: Internal Medicine

## 2024-05-26 NOTE — Telephone Encounter (Signed)
 Patient sent a My Chart message in response to voicemail. Pt is unable to receive calls because she is at a conference. Patient will respond only to MyChart message at this time. Office will look out for patient's message.

## 2024-05-28 ENCOUNTER — Inpatient Hospital Stay: Admitting: Gynecologic Oncology

## 2024-06-04 ENCOUNTER — Telehealth: Payer: Self-pay | Admitting: *Deleted

## 2024-06-04 NOTE — Telephone Encounter (Signed)
-----   Message from Eleanor Epps, NP sent at 06/04/2024 10:14 AM EST ----- Dr. Viktoria wants her to see her in February for a repeat biopsy. Her last was in November. Dr. 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  In regards to April dates, options include April 1, April 8, April 15, April 16, April 22, April 29, 30. If she wants to select a date, we will go ahead and book her on that day so the spot does not get taken.

## 2024-06-04 NOTE — Telephone Encounter (Signed)
 Attempted to reach patient to relay message from provider. Left voicemail stating that Dr. Viktoria would like to see patient in February and requested a call back to [phone].

## 2024-06-08 ENCOUNTER — Encounter: Payer: Self-pay | Admitting: *Deleted

## 2024-06-09 ENCOUNTER — Encounter: Payer: Self-pay | Admitting: Physician Assistant

## 2024-06-09 NOTE — Telephone Encounter (Signed)
 Pt is due for diabetes followup and scheduled 06/16/24 and requesting that be a VV and agreeable to completing labs in [place] prior to appt. Please advise

## 2024-06-10 NOTE — Telephone Encounter (Signed)
Please see pt msg as FYI 

## 2024-06-11 ENCOUNTER — Encounter: Payer: Self-pay | Admitting: *Deleted

## 2024-06-16 ENCOUNTER — Ambulatory Visit: Admitting: Physician Assistant

## 2024-06-17 ENCOUNTER — Inpatient Hospital Stay: Admitting: Gynecologic Oncology
